# Patient Record
Sex: Male | Born: 1935 | Race: White | Hispanic: No | Marital: Married | Smoking: Former smoker
Health system: Southern US, Community
[De-identification: ages and names within clinical notes are randomized; demographics above are authoritative.]

## PROBLEM LIST (undated history)

## (undated) DIAGNOSIS — I10 Essential (primary) hypertension: Secondary | ICD-10-CM

## (undated) DIAGNOSIS — K222 Esophageal obstruction: Secondary | ICD-10-CM

## (undated) DIAGNOSIS — J61 Pneumoconiosis due to asbestos and other mineral fibers: Secondary | ICD-10-CM

## (undated) DIAGNOSIS — I359 Nonrheumatic aortic valve disorder, unspecified: Secondary | ICD-10-CM

## (undated) DIAGNOSIS — Z7901 Long term (current) use of anticoagulants: Secondary | ICD-10-CM

## (undated) DIAGNOSIS — G4733 Obstructive sleep apnea (adult) (pediatric): Secondary | ICD-10-CM

## (undated) DIAGNOSIS — E785 Hyperlipidemia, unspecified: Secondary | ICD-10-CM

## (undated) DIAGNOSIS — F0390 Unspecified dementia without behavioral disturbance: Secondary | ICD-10-CM

## (undated) DIAGNOSIS — I2510 Atherosclerotic heart disease of native coronary artery without angina pectoris: Secondary | ICD-10-CM

## (undated) DIAGNOSIS — R0602 Shortness of breath: Secondary | ICD-10-CM

## (undated) DIAGNOSIS — E860 Dehydration: Secondary | ICD-10-CM

## (undated) DIAGNOSIS — I739 Peripheral vascular disease, unspecified: Secondary | ICD-10-CM

## (undated) DIAGNOSIS — D696 Thrombocytopenia, unspecified: Secondary | ICD-10-CM

## (undated) DIAGNOSIS — K635 Polyp of colon: Secondary | ICD-10-CM

## (undated) DIAGNOSIS — D72829 Elevated white blood cell count, unspecified: Secondary | ICD-10-CM

## (undated) DIAGNOSIS — Z85038 Personal history of other malignant neoplasm of large intestine: Secondary | ICD-10-CM

## (undated) DIAGNOSIS — IMO0002 Reserved for concepts with insufficient information to code with codable children: Secondary | ICD-10-CM

## (undated) DIAGNOSIS — I679 Cerebrovascular disease, unspecified: Secondary | ICD-10-CM

## (undated) DIAGNOSIS — J189 Pneumonia, unspecified organism: Secondary | ICD-10-CM

## (undated) DIAGNOSIS — I509 Heart failure, unspecified: Secondary | ICD-10-CM

## (undated) DIAGNOSIS — J9690 Respiratory failure, unspecified, unspecified whether with hypoxia or hypercapnia: Secondary | ICD-10-CM

## (undated) DIAGNOSIS — R0902 Hypoxemia: Secondary | ICD-10-CM

## (undated) DIAGNOSIS — I4891 Unspecified atrial fibrillation: Secondary | ICD-10-CM

## (undated) HISTORY — DX: Hyperlipidemia, unspecified: E78.5

## (undated) HISTORY — DX: Nonrheumatic aortic valve disorder, unspecified: I35.9

## (undated) HISTORY — DX: Personal history of other malignant neoplasm of large intestine: Z85.038

## (undated) HISTORY — PX: KNEE SURGERY: SHX244

## (undated) HISTORY — DX: Pneumoconiosis due to asbestos and other mineral fibers: J61

## (undated) HISTORY — DX: Unspecified atrial fibrillation: I48.91

## (undated) HISTORY — DX: Obstructive sleep apnea (adult) (pediatric): G47.33

## (undated) HISTORY — DX: Long term (current) use of anticoagulants: Z79.01

## (undated) HISTORY — PX: AORTO-FEMORAL BYPASS GRAFT: SHX885

## (undated) HISTORY — DX: Atherosclerotic heart disease of native coronary artery without angina pectoris: I25.10

## (undated) HISTORY — DX: Hypoxemia: R09.02

## (undated) HISTORY — DX: Polyp of colon: K63.5

## (undated) HISTORY — DX: Peripheral vascular disease, unspecified: I73.9

## (undated) HISTORY — PX: EYE SURGERY: SHX253

## (undated) HISTORY — DX: Shortness of breath: R06.02

## (undated) HISTORY — DX: Essential (primary) hypertension: I10

## (undated) HISTORY — DX: Reserved for concepts with insufficient information to code with codable children: IMO0002

## (undated) HISTORY — DX: Cerebrovascular disease, unspecified: I67.9

---

## 1990-09-01 HISTORY — PX: ABDOMINAL AORTIC ANEURYSM REPAIR: SUR1152

## 2000-12-31 ENCOUNTER — Encounter: Admission: RE | Admit: 2000-12-31 | Discharge: 2000-12-31 | Payer: Self-pay | Admitting: Critical Care Medicine

## 2000-12-31 ENCOUNTER — Encounter: Payer: Self-pay | Admitting: Critical Care Medicine

## 2001-04-10 ENCOUNTER — Encounter: Payer: Self-pay | Admitting: Emergency Medicine

## 2001-04-10 ENCOUNTER — Emergency Department (HOSPITAL_COMMUNITY): Admission: EM | Admit: 2001-04-10 | Discharge: 2001-04-10 | Payer: Self-pay | Admitting: Emergency Medicine

## 2002-09-28 ENCOUNTER — Ambulatory Visit (HOSPITAL_COMMUNITY): Admission: RE | Admit: 2002-09-28 | Discharge: 2002-09-28 | Payer: Self-pay | Admitting: Endocrinology

## 2002-09-28 ENCOUNTER — Encounter: Payer: Self-pay | Admitting: Endocrinology

## 2002-11-11 ENCOUNTER — Encounter: Payer: Self-pay | Admitting: Orthopedic Surgery

## 2002-11-11 ENCOUNTER — Encounter: Admission: RE | Admit: 2002-11-11 | Discharge: 2002-11-11 | Payer: Self-pay | Admitting: Orthopedic Surgery

## 2003-09-02 HISTORY — PX: AORTIC VALVE REPLACEMENT: SHX41

## 2003-09-02 HISTORY — PX: CORONARY ARTERY BYPASS GRAFT: SHX141

## 2003-10-25 ENCOUNTER — Inpatient Hospital Stay (HOSPITAL_COMMUNITY): Admission: RE | Admit: 2003-10-25 | Discharge: 2003-11-08 | Payer: Self-pay | Admitting: *Deleted

## 2003-10-31 ENCOUNTER — Encounter (INDEPENDENT_AMBULATORY_CARE_PROVIDER_SITE_OTHER): Payer: Self-pay | Admitting: Specialist

## 2003-11-07 ENCOUNTER — Encounter: Payer: Self-pay | Admitting: Cardiology

## 2003-11-23 ENCOUNTER — Encounter: Admission: RE | Admit: 2003-11-23 | Discharge: 2004-02-21 | Payer: Self-pay | Admitting: Cardiology

## 2003-12-18 ENCOUNTER — Encounter (HOSPITAL_COMMUNITY): Admission: RE | Admit: 2003-12-18 | Discharge: 2004-03-17 | Payer: Self-pay | Admitting: Cardiology

## 2004-03-15 ENCOUNTER — Encounter
Admission: RE | Admit: 2004-03-15 | Discharge: 2004-03-15 | Payer: Self-pay | Admitting: Thoracic Surgery (Cardiothoracic Vascular Surgery)

## 2004-03-18 ENCOUNTER — Encounter (HOSPITAL_COMMUNITY): Admission: RE | Admit: 2004-03-18 | Discharge: 2004-06-16 | Payer: Self-pay | Admitting: Cardiology

## 2004-03-19 ENCOUNTER — Encounter
Admission: RE | Admit: 2004-03-19 | Discharge: 2004-03-19 | Payer: Self-pay | Admitting: Thoracic Surgery (Cardiothoracic Vascular Surgery)

## 2004-08-08 ENCOUNTER — Ambulatory Visit (HOSPITAL_COMMUNITY): Admission: RE | Admit: 2004-08-08 | Discharge: 2004-08-08 | Payer: Self-pay | Admitting: Family Medicine

## 2004-08-13 ENCOUNTER — Other Ambulatory Visit: Admission: RE | Admit: 2004-08-13 | Discharge: 2004-08-13 | Payer: Self-pay | Admitting: Dermatology

## 2004-09-04 ENCOUNTER — Ambulatory Visit: Payer: Self-pay | Admitting: Gastroenterology

## 2004-09-17 ENCOUNTER — Encounter (INDEPENDENT_AMBULATORY_CARE_PROVIDER_SITE_OTHER): Payer: Self-pay | Admitting: Specialist

## 2004-09-17 ENCOUNTER — Ambulatory Visit (HOSPITAL_COMMUNITY): Admission: RE | Admit: 2004-09-17 | Discharge: 2004-09-17 | Payer: Self-pay | Admitting: Gastroenterology

## 2004-09-17 ENCOUNTER — Ambulatory Visit: Payer: Self-pay | Admitting: Gastroenterology

## 2004-12-02 ENCOUNTER — Ambulatory Visit: Payer: Self-pay | Admitting: Cardiology

## 2004-12-20 ENCOUNTER — Ambulatory Visit: Payer: Self-pay

## 2004-12-20 ENCOUNTER — Ambulatory Visit: Payer: Self-pay | Admitting: Cardiology

## 2004-12-30 ENCOUNTER — Ambulatory Visit: Payer: Self-pay | Admitting: Cardiology

## 2005-02-19 ENCOUNTER — Ambulatory Visit: Payer: Self-pay | Admitting: Cardiology

## 2005-06-04 ENCOUNTER — Ambulatory Visit: Payer: Self-pay | Admitting: Cardiology

## 2005-06-13 ENCOUNTER — Ambulatory Visit: Payer: Self-pay | Admitting: Gastroenterology

## 2005-09-17 ENCOUNTER — Other Ambulatory Visit: Admission: RE | Admit: 2005-09-17 | Discharge: 2005-09-17 | Payer: Self-pay | Admitting: Dermatology

## 2005-12-26 ENCOUNTER — Ambulatory Visit: Payer: Self-pay

## 2005-12-26 ENCOUNTER — Ambulatory Visit: Payer: Self-pay | Admitting: Cardiology

## 2006-01-06 ENCOUNTER — Ambulatory Visit: Payer: Self-pay

## 2006-01-06 ENCOUNTER — Ambulatory Visit: Payer: Self-pay | Admitting: Cardiology

## 2006-01-28 ENCOUNTER — Ambulatory Visit: Payer: Self-pay | Admitting: Cardiovascular Disease

## 2006-03-02 ENCOUNTER — Ambulatory Visit: Payer: Self-pay | Admitting: Cardiology

## 2006-03-10 ENCOUNTER — Ambulatory Visit: Payer: Self-pay

## 2006-03-12 ENCOUNTER — Ambulatory Visit: Payer: Self-pay | Admitting: Cardiology

## 2006-07-14 ENCOUNTER — Ambulatory Visit: Payer: Self-pay | Admitting: Internal Medicine

## 2006-07-27 ENCOUNTER — Ambulatory Visit: Payer: Self-pay | Admitting: Cardiology

## 2006-08-12 ENCOUNTER — Ambulatory Visit: Payer: Self-pay | Admitting: Gastroenterology

## 2006-09-09 ENCOUNTER — Ambulatory Visit: Payer: Self-pay | Admitting: Cardiology

## 2006-09-10 ENCOUNTER — Encounter (INDEPENDENT_AMBULATORY_CARE_PROVIDER_SITE_OTHER): Payer: Self-pay | Admitting: Cardiology

## 2006-09-10 LAB — CONVERTED CEMR LAB: Glucose, Bld: 122 mg/dL — ABNORMAL HIGH (ref 70–99)

## 2006-09-11 ENCOUNTER — Ambulatory Visit: Payer: Self-pay | Admitting: Internal Medicine

## 2006-09-21 ENCOUNTER — Ambulatory Visit: Payer: Self-pay | Admitting: Critical Care Medicine

## 2006-12-29 ENCOUNTER — Ambulatory Visit: Payer: Self-pay

## 2007-03-26 ENCOUNTER — Ambulatory Visit: Payer: Self-pay | Admitting: Cardiology

## 2007-04-08 ENCOUNTER — Encounter: Payer: Self-pay | Admitting: Cardiology

## 2007-04-08 ENCOUNTER — Ambulatory Visit: Payer: Self-pay

## 2007-06-01 ENCOUNTER — Ambulatory Visit (HOSPITAL_COMMUNITY): Admission: RE | Admit: 2007-06-01 | Discharge: 2007-06-01 | Payer: Self-pay | Admitting: Family Medicine

## 2007-09-24 ENCOUNTER — Ambulatory Visit: Payer: Self-pay | Admitting: Cardiology

## 2007-10-07 DIAGNOSIS — I679 Cerebrovascular disease, unspecified: Secondary | ICD-10-CM

## 2007-10-07 DIAGNOSIS — I10 Essential (primary) hypertension: Secondary | ICD-10-CM | POA: Insufficient documentation

## 2007-10-07 DIAGNOSIS — I359 Nonrheumatic aortic valve disorder, unspecified: Secondary | ICD-10-CM | POA: Insufficient documentation

## 2007-10-07 DIAGNOSIS — I2510 Atherosclerotic heart disease of native coronary artery without angina pectoris: Secondary | ICD-10-CM | POA: Insufficient documentation

## 2007-10-07 DIAGNOSIS — E785 Hyperlipidemia, unspecified: Secondary | ICD-10-CM

## 2007-10-07 DIAGNOSIS — J309 Allergic rhinitis, unspecified: Secondary | ICD-10-CM | POA: Insufficient documentation

## 2007-10-07 DIAGNOSIS — I739 Peripheral vascular disease, unspecified: Secondary | ICD-10-CM

## 2007-10-07 DIAGNOSIS — G4733 Obstructive sleep apnea (adult) (pediatric): Secondary | ICD-10-CM

## 2007-10-07 DIAGNOSIS — Z951 Presence of aortocoronary bypass graft: Secondary | ICD-10-CM

## 2007-10-07 HISTORY — DX: Cerebrovascular disease, unspecified: I67.9

## 2007-10-07 HISTORY — DX: Atherosclerotic heart disease of native coronary artery without angina pectoris: I25.10

## 2007-10-07 HISTORY — DX: Nonrheumatic aortic valve disorder, unspecified: I35.9

## 2007-10-07 HISTORY — DX: Obstructive sleep apnea (adult) (pediatric): G47.33

## 2007-10-07 HISTORY — DX: Peripheral vascular disease, unspecified: I73.9

## 2007-10-07 HISTORY — DX: Hyperlipidemia, unspecified: E78.5

## 2007-10-07 HISTORY — DX: Essential (primary) hypertension: I10

## 2007-10-08 ENCOUNTER — Ambulatory Visit: Payer: Self-pay | Admitting: Critical Care Medicine

## 2007-11-23 ENCOUNTER — Ambulatory Visit: Payer: Self-pay | Admitting: Internal Medicine

## 2007-11-23 ENCOUNTER — Inpatient Hospital Stay (HOSPITAL_COMMUNITY): Admission: EM | Admit: 2007-11-23 | Discharge: 2007-11-27 | Payer: Self-pay | Admitting: Emergency Medicine

## 2007-11-24 ENCOUNTER — Encounter: Payer: Self-pay | Admitting: Cardiology

## 2007-12-01 ENCOUNTER — Ambulatory Visit: Payer: Self-pay | Admitting: Internal Medicine

## 2007-12-01 HISTORY — PX: ESOPHAGOGASTRODUODENOSCOPY: SHX1529

## 2007-12-08 ENCOUNTER — Ambulatory Visit: Payer: Self-pay | Admitting: Cardiovascular Disease

## 2007-12-11 ENCOUNTER — Ambulatory Visit: Payer: Self-pay | Admitting: Gastroenterology

## 2007-12-11 ENCOUNTER — Emergency Department (HOSPITAL_COMMUNITY): Admission: EM | Admit: 2007-12-11 | Discharge: 2007-12-11 | Payer: Self-pay | Admitting: Emergency Medicine

## 2007-12-13 ENCOUNTER — Ambulatory Visit: Payer: Self-pay | Admitting: Cardiology

## 2007-12-27 ENCOUNTER — Ambulatory Visit: Payer: Self-pay | Admitting: Cardiovascular Disease

## 2008-01-10 ENCOUNTER — Ambulatory Visit: Payer: Self-pay | Admitting: Cardiology

## 2008-02-01 ENCOUNTER — Ambulatory Visit: Payer: Self-pay | Admitting: Cardiology

## 2008-02-06 ENCOUNTER — Emergency Department (HOSPITAL_COMMUNITY): Admission: EM | Admit: 2008-02-06 | Discharge: 2008-02-06 | Payer: Self-pay | Admitting: Emergency Medicine

## 2008-02-07 ENCOUNTER — Encounter: Payer: Self-pay | Admitting: Gastroenterology

## 2008-02-09 ENCOUNTER — Ambulatory Visit: Payer: Self-pay | Admitting: Gastroenterology

## 2008-02-09 ENCOUNTER — Ambulatory Visit: Payer: Self-pay

## 2008-02-14 ENCOUNTER — Ambulatory Visit: Payer: Self-pay | Admitting: Critical Care Medicine

## 2008-02-14 ENCOUNTER — Ambulatory Visit: Payer: Self-pay | Admitting: Cardiology

## 2008-02-14 DIAGNOSIS — R0602 Shortness of breath: Secondary | ICD-10-CM

## 2008-02-14 HISTORY — DX: Shortness of breath: R06.02

## 2008-02-14 LAB — CONVERTED CEMR LAB: Prothrombin Time: 18.2 s — ABNORMAL HIGH (ref 10.9–13.3)

## 2008-02-15 ENCOUNTER — Ambulatory Visit: Payer: Self-pay | Admitting: Critical Care Medicine

## 2008-02-21 ENCOUNTER — Encounter: Payer: Self-pay | Admitting: Critical Care Medicine

## 2008-02-21 DIAGNOSIS — J61 Pneumoconiosis due to asbestos and other mineral fibers: Secondary | ICD-10-CM

## 2008-02-21 HISTORY — DX: Pneumoconiosis due to asbestos and other mineral fibers: J61

## 2008-02-29 ENCOUNTER — Ambulatory Visit: Payer: Self-pay | Admitting: Critical Care Medicine

## 2008-03-09 ENCOUNTER — Ambulatory Visit: Payer: Self-pay | Admitting: Cardiology

## 2008-04-06 ENCOUNTER — Ambulatory Visit: Payer: Self-pay | Admitting: Internal Medicine

## 2008-05-04 ENCOUNTER — Ambulatory Visit: Payer: Self-pay | Admitting: Internal Medicine

## 2008-05-15 ENCOUNTER — Ambulatory Visit: Payer: Self-pay | Admitting: Critical Care Medicine

## 2008-05-18 ENCOUNTER — Ambulatory Visit: Payer: Self-pay | Admitting: Cardiology

## 2008-06-08 ENCOUNTER — Ambulatory Visit: Payer: Self-pay | Admitting: Cardiology

## 2008-06-15 ENCOUNTER — Telehealth (INDEPENDENT_AMBULATORY_CARE_PROVIDER_SITE_OTHER): Payer: Self-pay | Admitting: *Deleted

## 2008-07-06 ENCOUNTER — Ambulatory Visit: Payer: Self-pay | Admitting: Cardiology

## 2008-08-02 ENCOUNTER — Ambulatory Visit: Payer: Self-pay | Admitting: Cardiology

## 2008-08-03 ENCOUNTER — Ambulatory Visit: Payer: Self-pay | Admitting: Cardiology

## 2008-08-21 ENCOUNTER — Ambulatory Visit: Payer: Self-pay | Admitting: Cardiology

## 2008-09-05 ENCOUNTER — Ambulatory Visit (HOSPITAL_COMMUNITY): Admission: RE | Admit: 2008-09-05 | Discharge: 2008-09-05 | Payer: Self-pay | Admitting: Cardiology

## 2008-09-13 ENCOUNTER — Ambulatory Visit (HOSPITAL_COMMUNITY): Admission: RE | Admit: 2008-09-13 | Discharge: 2008-09-13 | Payer: Self-pay | Admitting: Family Medicine

## 2008-09-15 ENCOUNTER — Ambulatory Visit: Payer: Self-pay | Admitting: Cardiology

## 2008-09-15 ENCOUNTER — Encounter: Payer: Self-pay | Admitting: Physician Assistant

## 2008-09-18 ENCOUNTER — Ambulatory Visit: Payer: Self-pay | Admitting: Cardiology

## 2008-09-22 ENCOUNTER — Ambulatory Visit: Payer: Self-pay | Admitting: Internal Medicine

## 2008-09-27 ENCOUNTER — Ambulatory Visit: Payer: Self-pay | Admitting: Cardiology

## 2008-10-05 ENCOUNTER — Ambulatory Visit: Payer: Self-pay | Admitting: Internal Medicine

## 2008-10-23 ENCOUNTER — Ambulatory Visit: Payer: Self-pay | Admitting: Cardiology

## 2008-10-25 ENCOUNTER — Ambulatory Visit: Payer: Self-pay | Admitting: Cardiology

## 2008-11-20 ENCOUNTER — Ambulatory Visit: Payer: Self-pay | Admitting: Cardiology

## 2008-12-18 ENCOUNTER — Ambulatory Visit: Payer: Self-pay | Admitting: Cardiovascular Disease

## 2009-01-03 ENCOUNTER — Ambulatory Visit: Payer: Self-pay | Admitting: Cardiovascular Disease

## 2009-01-19 ENCOUNTER — Ambulatory Visit: Payer: Self-pay | Admitting: Internal Medicine

## 2009-01-19 ENCOUNTER — Ambulatory Visit: Payer: Self-pay | Admitting: Cardiology

## 2009-01-19 DIAGNOSIS — I4891 Unspecified atrial fibrillation: Secondary | ICD-10-CM

## 2009-01-19 HISTORY — DX: Unspecified atrial fibrillation: I48.91

## 2009-01-31 ENCOUNTER — Encounter: Payer: Self-pay | Admitting: *Deleted

## 2009-02-16 ENCOUNTER — Ambulatory Visit: Payer: Self-pay | Admitting: Internal Medicine

## 2009-02-16 LAB — CONVERTED CEMR LAB: POC INR: 1.8

## 2009-03-07 ENCOUNTER — Encounter: Payer: Self-pay | Admitting: *Deleted

## 2009-03-16 ENCOUNTER — Ambulatory Visit: Payer: Self-pay | Admitting: Cardiology

## 2009-04-06 ENCOUNTER — Ambulatory Visit: Payer: Self-pay | Admitting: Cardiology

## 2009-05-04 ENCOUNTER — Ambulatory Visit: Payer: Self-pay | Admitting: Internal Medicine

## 2009-05-11 ENCOUNTER — Ambulatory Visit (HOSPITAL_COMMUNITY): Admission: RE | Admit: 2009-05-11 | Discharge: 2009-05-11 | Payer: Self-pay | Admitting: Family Medicine

## 2009-05-11 ENCOUNTER — Encounter: Payer: Self-pay | Admitting: Cardiology

## 2009-05-15 ENCOUNTER — Ambulatory Visit: Payer: Self-pay | Admitting: Internal Medicine

## 2009-05-15 DIAGNOSIS — R0902 Hypoxemia: Secondary | ICD-10-CM

## 2009-05-15 HISTORY — DX: Hypoxemia: R09.02

## 2009-05-16 ENCOUNTER — Encounter: Payer: Self-pay | Admitting: Adult Health

## 2009-05-17 ENCOUNTER — Encounter: Payer: Self-pay | Admitting: Adult Health

## 2009-05-18 ENCOUNTER — Telehealth (INDEPENDENT_AMBULATORY_CARE_PROVIDER_SITE_OTHER): Payer: Self-pay | Admitting: *Deleted

## 2009-05-22 ENCOUNTER — Telehealth: Payer: Self-pay | Admitting: Cardiology

## 2009-05-23 ENCOUNTER — Ambulatory Visit: Payer: Self-pay | Admitting: Cardiology

## 2009-05-24 ENCOUNTER — Encounter (INDEPENDENT_AMBULATORY_CARE_PROVIDER_SITE_OTHER): Payer: Self-pay | Admitting: *Deleted

## 2009-06-01 ENCOUNTER — Ambulatory Visit: Payer: Self-pay | Admitting: Internal Medicine

## 2009-06-01 LAB — CONVERTED CEMR LAB: POC INR: 2.6

## 2009-06-05 ENCOUNTER — Telehealth (INDEPENDENT_AMBULATORY_CARE_PROVIDER_SITE_OTHER): Payer: Self-pay | Admitting: *Deleted

## 2009-06-12 ENCOUNTER — Encounter (INDEPENDENT_AMBULATORY_CARE_PROVIDER_SITE_OTHER): Payer: Self-pay | Admitting: *Deleted

## 2009-06-20 ENCOUNTER — Telehealth: Payer: Self-pay | Admitting: Critical Care Medicine

## 2009-06-20 ENCOUNTER — Telehealth: Payer: Self-pay | Admitting: Cardiology

## 2009-06-29 ENCOUNTER — Ambulatory Visit: Payer: Self-pay | Admitting: Internal Medicine

## 2009-06-29 LAB — CONVERTED CEMR LAB: POC INR: 2.4

## 2009-07-23 ENCOUNTER — Encounter: Payer: Self-pay | Admitting: Cardiology

## 2009-07-27 ENCOUNTER — Ambulatory Visit: Payer: Self-pay | Admitting: Internal Medicine

## 2009-08-21 ENCOUNTER — Ambulatory Visit: Payer: Self-pay | Admitting: Cardiology

## 2009-08-21 ENCOUNTER — Ambulatory Visit: Payer: Self-pay | Admitting: Internal Medicine

## 2009-08-21 LAB — CONVERTED CEMR LAB: POC INR: 2.4

## 2009-09-18 ENCOUNTER — Ambulatory Visit: Payer: Self-pay | Admitting: Cardiology

## 2009-09-18 LAB — CONVERTED CEMR LAB: POC INR: 2.2

## 2009-10-16 ENCOUNTER — Ambulatory Visit: Payer: Self-pay | Admitting: Internal Medicine

## 2009-10-22 ENCOUNTER — Encounter: Payer: Self-pay | Admitting: Cardiology

## 2009-11-13 ENCOUNTER — Ambulatory Visit: Payer: Self-pay | Admitting: Cardiology

## 2009-12-11 ENCOUNTER — Ambulatory Visit: Payer: Self-pay | Admitting: Cardiovascular Disease

## 2010-01-01 ENCOUNTER — Ambulatory Visit: Payer: Self-pay | Admitting: Cardiology

## 2010-01-01 LAB — CONVERTED CEMR LAB: POC INR: 2.7

## 2010-02-01 ENCOUNTER — Ambulatory Visit: Payer: Self-pay | Admitting: Cardiology

## 2010-02-01 ENCOUNTER — Ambulatory Visit: Payer: Self-pay | Admitting: Cardiovascular Disease

## 2010-02-01 DIAGNOSIS — R609 Edema, unspecified: Secondary | ICD-10-CM

## 2010-02-12 ENCOUNTER — Ambulatory Visit: Payer: Self-pay | Admitting: Cardiology

## 2010-02-14 LAB — CONVERTED CEMR LAB
BUN: 19 mg/dL (ref 6–23)
Chloride: 105 meq/L (ref 96–112)
Glucose, Bld: 107 mg/dL — ABNORMAL HIGH (ref 70–99)
Potassium: 4.1 meq/L (ref 3.5–5.1)

## 2010-03-01 ENCOUNTER — Ambulatory Visit: Payer: Self-pay | Admitting: Cardiology

## 2010-03-01 LAB — CONVERTED CEMR LAB: POC INR: 2.1

## 2010-03-29 ENCOUNTER — Ambulatory Visit: Payer: Self-pay | Admitting: Cardiology

## 2010-04-26 ENCOUNTER — Ambulatory Visit: Payer: Self-pay | Admitting: Internal Medicine

## 2010-04-26 ENCOUNTER — Encounter (INDEPENDENT_AMBULATORY_CARE_PROVIDER_SITE_OTHER): Payer: Self-pay | Admitting: *Deleted

## 2010-04-30 ENCOUNTER — Ambulatory Visit: Payer: Self-pay | Admitting: Critical Care Medicine

## 2010-04-30 DIAGNOSIS — J449 Chronic obstructive pulmonary disease, unspecified: Secondary | ICD-10-CM

## 2010-04-30 DIAGNOSIS — J4489 Other specified chronic obstructive pulmonary disease: Secondary | ICD-10-CM | POA: Insufficient documentation

## 2010-05-08 ENCOUNTER — Ambulatory Visit: Payer: Self-pay | Admitting: Cardiology

## 2010-05-24 ENCOUNTER — Ambulatory Visit: Payer: Self-pay | Admitting: Internal Medicine

## 2010-05-24 LAB — CONVERTED CEMR LAB: POC INR: 2.9

## 2010-05-26 ENCOUNTER — Encounter: Admission: RE | Admit: 2010-05-26 | Discharge: 2010-05-26 | Payer: Self-pay | Admitting: Sports Medicine

## 2010-05-29 ENCOUNTER — Encounter: Payer: Self-pay | Admitting: Cardiology

## 2010-06-05 ENCOUNTER — Ambulatory Visit: Payer: Self-pay | Admitting: Cardiovascular Disease

## 2010-06-05 ENCOUNTER — Encounter: Admission: RE | Admit: 2010-06-05 | Discharge: 2010-06-05 | Payer: Self-pay | Admitting: Sports Medicine

## 2010-06-05 LAB — CONVERTED CEMR LAB: POC INR: 1.3

## 2010-06-10 ENCOUNTER — Encounter: Payer: Self-pay | Admitting: Critical Care Medicine

## 2010-06-14 ENCOUNTER — Ambulatory Visit: Payer: Self-pay | Admitting: Cardiology

## 2010-06-14 LAB — CONVERTED CEMR LAB: POC INR: 3

## 2010-06-18 ENCOUNTER — Ambulatory Visit: Payer: Self-pay | Admitting: Critical Care Medicine

## 2010-07-12 ENCOUNTER — Ambulatory Visit: Payer: Self-pay | Admitting: Cardiology

## 2010-07-29 ENCOUNTER — Ambulatory Visit: Payer: Self-pay | Admitting: Internal Medicine

## 2010-07-29 LAB — CONVERTED CEMR LAB: POC INR: 2.8

## 2010-08-07 ENCOUNTER — Encounter
Admission: RE | Admit: 2010-08-07 | Discharge: 2010-08-07 | Payer: Self-pay | Source: Home / Self Care | Admitting: Sports Medicine

## 2010-08-07 ENCOUNTER — Ambulatory Visit: Payer: Self-pay | Admitting: Internal Medicine

## 2010-08-19 ENCOUNTER — Ambulatory Visit: Payer: Self-pay | Admitting: Internal Medicine

## 2010-08-19 LAB — CONVERTED CEMR LAB: POC INR: 2.5

## 2010-09-16 ENCOUNTER — Ambulatory Visit: Admission: RE | Admit: 2010-09-16 | Discharge: 2010-09-16 | Payer: Self-pay | Source: Home / Self Care

## 2010-09-16 LAB — CONVERTED CEMR LAB: POC INR: 2.5

## 2010-09-22 ENCOUNTER — Encounter: Payer: Self-pay | Admitting: Thoracic Surgery (Cardiothoracic Vascular Surgery)

## 2010-09-26 ENCOUNTER — Telehealth: Payer: Self-pay | Admitting: Cardiology

## 2010-10-03 ENCOUNTER — Ambulatory Visit (INDEPENDENT_AMBULATORY_CARE_PROVIDER_SITE_OTHER): Payer: Medicare Other | Admitting: Critical Care Medicine

## 2010-10-03 ENCOUNTER — Encounter: Payer: Self-pay | Admitting: Critical Care Medicine

## 2010-10-03 ENCOUNTER — Ambulatory Visit: Admit: 2010-10-03 | Payer: Self-pay | Admitting: Critical Care Medicine

## 2010-10-03 DIAGNOSIS — J449 Chronic obstructive pulmonary disease, unspecified: Secondary | ICD-10-CM

## 2010-10-03 DIAGNOSIS — J4489 Other specified chronic obstructive pulmonary disease: Secondary | ICD-10-CM

## 2010-10-03 NOTE — Medication Information (Signed)
Summary: rov/sp  Anticoagulant Therapy  Managed by: Bethena Midget, RN, BSN Referring MD: Valera Castle MD PCP: Dr. Bernell List Supervising MD: Tenny Craw MD, Gunnar Fusi Indication 1: Atrial Fibrillation (ICD-427.31) Lab Used: [facility] Site: [facility] INR POC 2.7 INR RANGE 2 - 3  Dietary changes: no    Health status changes: no    Bleeding/hemorrhagic complications: no    Recent/future hospitalizations: no    Any changes in medication regimen? no    Recent/future dental: no  Any missed doses?: no       Is patient compliant with meds? yes       Allergies: 1)  ! Morphine  Anticoagulation Management History:      The patient is taking warfarin and comes in today for a routine follow up visit.  Positive risk factors for bleeding include an age of 75 years or older.  The bleeding index is 'intermediate risk'.  Positive CHADS2 values include History of HTN and Age > 75 years old.  The start date was 11/24/2007.  His last INR was 1.6 RATIO.  Anticoagulation responsible provider: Tenny Craw MD, Gunnar Fusi.  INR POC: 2.7.  Cuvette Lot#: [id number].  Exp: 06/2011.    Anticoagulation Management Assessment/Plan:      The patient's current anticoagulation dose is Warfarin sodium 5 mg  tabs: Take as directed by coumadin clinic..  The target INR is 2 - 3.  The next INR is due 05/24/2010.  Anticoagulation instructions were given to patient.  Results were reviewed/authorized by Bethena Midget, RN, BSN.  He was notified by Bethena Midget, RN, BSN.         Prior Anticoagulation Instructions: INR 2.3  Continue same dose of 1 tablet every day except 1/2 tablet on Monday and Friday.    Current Anticoagulation Instructions: INR 2.7 Continue 5mg s everyday except 2.5mg s on Mondays and Fridays. Recheck in 4 weeks.

## 2010-10-03 NOTE — Medication Information (Signed)
Summary: ccr/per pt call/jss  Anticoagulant Therapy  Managed by: Leota Sauers, PharmD, BCPS, CPP Referring MD: Valera Castle MD PCP: Dr. Bernell List Supervising MD: Daleen Squibb MD, Maisie Fus Indication 1: Atrial Fibrillation (ICD-427.31) Lab Used: [facility] Site: [facility] INR POC 2.7 INR RANGE 2 - 3  Dietary changes: no    Health status changes: no    Bleeding/hemorrhagic complications: no    Recent/future hospitalizations: no    Any changes in medication regimen? no    Recent/future dental: no  Any missed doses?: no       Is patient compliant with meds? yes       Current Medications (verified): 1)  Metoprolol Succinate 50 Mg Xr24h-Tab (Metoprolol Succinate) .Marland Kitchen.. 1 Tab Once Daily 2)  Lisinopril 40 Mg  Tabs (Lisinopril) .Marland Kitchen.. 1 By Mouth Daily 3)  Pravastatin Sodium 40 Mg  Tabs (Pravastatin Sodium) .Marland Kitchen.. 1 By Mouth Daily 4)  Adult Aspirin Ec Low Strength 81 Mg  Tbec (Aspirin) .Marland Kitchen.. 1 By Mouth Daily 5)  Multivitamins   Tabs (Multiple Vitamin) .Marland Kitchen.. 1 By Mouth Daily 6)  Amlodipine Besylate 10 Mg  Tabs (Amlodipine Besylate) .Marland Kitchen.. 1 By Mouth Daily 7)  Cpap .Marland Kitchen.. 12 Cmh20 At Bedtime 8)  Klor-Con M20 20 Meq  Tbcr (Potassium Chloride Crys Cr) .... As Needed 9)  Furosemide 40 Mg  Tabs (Furosemide) .... As Needed 10)  Warfarin Sodium 5 Mg  Tabs (Warfarin Sodium) .... Take As Directed By Coumadin Clinic. 11)  Nexium 40 Mg Cpdr (Esomeprazole Magnesium) .Marland Kitchen.. 1 Cap Once Daily  Allergies: 1)  ! Morphine  Anticoagulation Management History:      The patient is taking warfarin and comes in today for a routine follow up visit.  Positive risk factors for bleeding include an age of 75 years or older.  The bleeding index is 'intermediate risk'.  Positive CHADS2 values include History of HTN and Age > 31 years old.  The start date was 11/24/2007.  His last INR was 1.6 RATIO.  Anticoagulation responsible provider: Daleen Squibb MD, Maisie Fus.  INR POC: 2.7.  Cuvette Lot#: [id number].  Exp: 12/2010.    Anticoagulation  Management Assessment/Plan:      The patient's current anticoagulation dose is Warfarin sodium 5 mg  tabs: Take as directed by coumadin clinic..  The target INR is 2 - 3.  The next INR is due 01/29/2010.  Anticoagulation instructions were given to patient.  Results were reviewed/authorized by Leota Sauers, PharmD, BCPS, CPP.         Prior Anticoagulation Instructions: INR 3.1  Take 1/2 tablet tomorrow then resume same dose of 1 tablet every day except 1/2 tablet on Monday and Friday   Current Anticoagulation Instructions: INR 2.7  Coumadin 1 tab = 5mg  each day EXCEPT 1/2 tab on Mon and Fri

## 2010-10-03 NOTE — Letter (Signed)
[facility]: [facility]   Imported By: Marylou Mccoy 06/10/2010 08:28:57  _____________________________________________________________________  External Attachment:    Type:   Image     Comment:   External Document

## 2010-10-03 NOTE — Medication Information (Signed)
Summary: rov/tm   Anticoagulant Therapy  Managed by: Lyna Poser, PharmD Referring MD: Valera Castle MD PCP: [facility]  Supervising MD: Riley Kill MD, Maisie Fus Indication 1: Atrial Fibrillation (ICD-427.31) Lab Used[facility]  Site: [facility] INR POC 2.8 INR RANGE 2 - 3  Dietary changes: no    Health status changes: no    Bleeding/hemorrhagic complications: no    Recent/future hospitalizations: no    Any changes in medication regimen? no    Recent/future dental: no  Any missed doses?: no       Is patient compliant with meds? yes       Allergies: 1)  ! Morphine  Anticoagulation Management History:      The patient is taking warfarin and comes in today for a routine follow up visit.  Positive risk factors for bleeding include an age of 75 years or older.  The bleeding index is 'intermediate risk'.  Positive CHADS2 values include History of HTN and Age > 21 years old.  The start date was 11/24/2007.  His last INR was 1.6 RATIO.  Anticoagulation responsible Mickaela Starlin: Riley Kill MD, Maisie Fus.  INR POC: 2.8.  Cuvette Lot#: [id number].  Exp: 08/2011.    Anticoagulation Management Assessment/Plan:      The patient's current anticoagulation dose is Warfarin sodium 5 mg  tabs: Take as directed by coumadin clinic..  The target INR is 2 - 3.  The next INR is due 08/19/2010.  Anticoagulation instructions were given to patient.  Results were reviewed/authorized by Lyna Poser, PharmD.         Prior Anticoagulation Instructions: INR 3.8 Skip Saturdays dose then resume 1 pill everyday except 1/2 pill on Mondays and Fridays. Recheck in 2 1/2  weeks.   Current Anticoagulation Instructions: INR 2.8 Continue taking a half tablet on monday and friday. And 1 tablet all other days. Recheck in 3 weeks.

## 2010-10-03 NOTE — Miscellaneous (Signed)
Summary: med update  Clinical Lists Changes  Medications: Removed medication of FUROSEMIDE 40 MG  TABS (FUROSEMIDE) 1 tab once daily as needed

## 2010-10-03 NOTE — Medication Information (Signed)
Summary: rov/mw   Anticoagulant Therapy  Managed by: Weston Brass, PharmD Referring MD: Valera Castle MD PCP: [facility]  Supervising MD: Ladona Ridgel MD, Sharlot Gowda Indication 1: Atrial Fibrillation (ICD-427.31) Lab Used: [facility] Site: [facility] INR POC 1.2 INR RANGE 2 - 3  Dietary changes: no    Health status changes: yes       Details: having spinal injection today  Bleeding/hemorrhagic complications: no    Recent/future hospitalizations: no    Any changes in medication regimen? no    Recent/future dental: no  Any missed doses?: yes     Details: has been holding Coumadin since Saturday for spinal injection   Is patient compliant with meds? yes       Allergies: 1)  ! Morphine  Anticoagulation Management History:      The patient is taking warfarin and comes in today for a routine follow up visit.  Positive risk factors for bleeding include an age of 17 years or older.  The bleeding index is 'intermediate risk'.  Positive CHADS2 values include History of HTN and Age > 67 years old.  The start date was 11/24/2007.  His last INR was 1.6 RATIO.  Anticoagulation responsible Amorita Vanrossum: Ladona Ridgel MD, Sharlot Gowda.  INR POC: 1.2.  Cuvette Lot#: [id number].  Exp: 10/2011.    Anticoagulation Management Assessment/Plan:      The patient's current anticoagulation dose is Warfarin sodium 5 mg  tabs: Take as directed by coumadin clinic..  The target INR is 2 - 3.  The next INR is due 08/19/2010.  Anticoagulation instructions were given to patient.  Results were reviewed/authorized by Weston Brass, PharmD.  He was notified by Weston Brass PharmD.         Prior Anticoagulation Instructions: INR 2.8 Continue taking a half tablet on monday and friday. And 1 tablet all other days. Recheck in 3 weeks.   Current Anticoagulation Instructions: INR 1.2  Take 1 1/2 tablets today and tomorrow then resume same dose of 1 tablet every day excpet 1/2 tablet on Monday and Friday.  Recheck INR in 10-14 days.

## 2010-10-03 NOTE — Medication Information (Signed)
Summary: rov/tm   Anticoagulant Therapy  Managed by: Bethena Midget, RN, BSN Referring MD: Valera Castle MD PCP: [facility]  Supervising MD: Tenny Craw MD, Gunnar Fusi Indication 1: Atrial Fibrillation (ICD-427.31) Lab Used: [facility] Site: [facility] INR POC 2.9 INR RANGE 2 - 3  Dietary changes: no    Health status changes: no    Bleeding/hemorrhagic complications: no    Recent/future hospitalizations: no    Any changes in medication regimen? no    Recent/future dental: no  Any missed doses?: no       Is patient compliant with meds? yes       Allergies: 1)  ! Morphine  Anticoagulation Management History:      The patient is taking warfarin and comes in today for a routine follow up visit.  Positive risk factors for bleeding include an age of 75 years or older.  The bleeding index is 'intermediate risk'.  Positive CHADS2 values include History of HTN and Age > 63 years old.  The start date was 11/24/2007.  His last INR was 1.6 RATIO.  Anticoagulation responsible provider: Tenny Craw MD, Gunnar Fusi.  INR POC: 2.9.  Cuvette Lot#: [id number].  Exp: 06/2011.    Anticoagulation Management Assessment/Plan:      The patient's current anticoagulation dose is Warfarin sodium 5 mg  tabs: Take as directed by coumadin clinic..  The target INR is 2 - 3.  The next INR is due 06/21/2010.  Anticoagulation instructions were given to patient.  Results were reviewed/authorized by Bethena Midget, RN, BSN.         Prior Anticoagulation Instructions: INR 2.7 Continue 5mg s everyday except 2.5mg s on Mondays and Fridays. Recheck in 4 weeks.   Current Anticoagulation Instructions: INR 2.9  No changes today. Take 1 tablet everyday except on monday and friday. On monday and friday take a half tablet. Recheck in 4 weeks.

## 2010-10-03 NOTE — Medication Information (Signed)
Summary: rov/tm  Anticoagulant Therapy  Managed by: Eda Keys, PharmD Referring MD: Valera Castle MD PCP: Dr. Bernell List Supervising MD: Graciela Husbands MD, Viviann Spare Indication 1: Atrial Fibrillation (ICD-427.31) Lab Used: [facility] Site: [facility] INR POC 2.1 INR RANGE 2 - 3  Dietary changes: no    Health status changes: no    Bleeding/hemorrhagic complications: no    Recent/future hospitalizations: no    Any changes in medication regimen? no    Recent/future dental: no  Any missed doses?: no       Is patient compliant with meds? yes       Allergies: 1)  ! Morphine  Anticoagulation Management History:      The patient is taking warfarin and comes in today for a routine follow up visit.  Positive risk factors for bleeding include an age of 75 years or older.  The bleeding index is 'intermediate risk'.  Positive CHADS2 values include History of HTN and Age > 79 years old.  The start date was 11/24/2007.  His last INR was 1.6 RATIO.  Anticoagulation responsible provider: Graciela Husbands MD, Viviann Spare.  INR POC: 2.1.  Cuvette Lot#: [id number].  Exp: 12/2010.    Anticoagulation Management Assessment/Plan:      The patient's current anticoagulation dose is Warfarin sodium 5 mg  tabs: Take as directed by coumadin clinic..  The target INR is 2 - 3.  The next INR is due 11/13/2009.  Anticoagulation instructions were given to patient.  Results were reviewed/authorized by Eda Keys, PharmD.  He was notified by Eda Keys.         Prior Anticoagulation Instructions: INR 2.2 Continue 5mg s daily except 2.5mg s on Mondays and Fridays. Recheck in 4 weeks.   Current Anticoagulation Instructions: INR 2.1  Continue current dosing schedule.  Take 1/2 tablet (2.5 mg) on Monday and Friday, and take 1 tablet (5 mg) all other days. Return to clinic in 4 weeks.

## 2010-10-03 NOTE — Medication Information (Signed)
Summary: rov/sp  Anticoagulant Therapy  Managed by: Cloyde Reams, RN, BSN Referring MD: Valera Castle MD PCP: [facility]  Supervising MD: Daleen Squibb MD, Maisie Fus Indication 1: Atrial Fibrillation (ICD-427.31) Lab Used: [facility] Site: [facility] INR POC 2.5 INR RANGE 2 - 3  Dietary changes: no    Health status changes: no    Bleeding/hemorrhagic complications: no    Recent/future hospitalizations: no    Any changes in medication regimen? no    Recent/future dental: no  Any missed doses?: no       Is patient compliant with meds? yes       Allergies: 1)  ! Morphine  Anticoagulation Management History:      The patient is taking warfarin and comes in today for a routine follow up visit.  Positive risk factors for bleeding include an age of 75 years or older.  The bleeding index is 'intermediate risk'.  Positive CHADS2 values include History of HTN and Age > 75 years old.  The start date was 11/24/2007.  His last INR was 1.6 RATIO.  Anticoagulation responsible provider: Daleen Squibb MD, Maisie Fus.  INR POC: 2.5.  Cuvette Lot#: [id number].  Exp: 10/2011.    Anticoagulation Management Assessment/Plan:      The patient's current anticoagulation dose is Warfarin sodium 5 mg  tabs: Take as directed by coumadin clinic..  The target INR is 2 - 3.  The next INR is due 10/14/2010.  Anticoagulation instructions were given to patient.  Results were reviewed/authorized by Cloyde Reams, RN, BSN.  He was notified by Cloyde Reams RN.         Prior Anticoagulation Instructions: INR 2.5  Continue same dose of 1 tablet every day except 1/2 tablet on Monday and Friday.  Recheck INR in 4 weeks.   Current Anticoagulation Instructions: INR 2.5  Continue on same dosage 1 tablet daily except 1/2 tablet on Mondays and Fridays.  Recheck in 4 weeks.

## 2010-10-03 NOTE — Medication Information (Signed)
Summary: rov/eac  Anticoagulant Therapy  Managed by: Cloyde Reams, RN, BSN Referring MD: Valera Castle MD PCP: Dr. Judi Saa MD: Myrtis Ser MD, Tinnie Gens Indication 1: Atrial Fibrillation (ICD-427.31) Lab Used: [facility] Site: [facility] INR POC 3.0 INR RANGE 2 - 3  Dietary changes: yes       Details: Maybe a little less vit K intake.    Health status changes: no    Bleeding/hemorrhagic complications: no    Recent/future hospitalizations: no    Any changes in medication regimen? no    Recent/future dental: no  Any missed doses?: no       Is patient compliant with meds? yes       Allergies (verified): 1)  ! Morphine  Anticoagulation Management History:      The patient is taking warfarin and comes in today for a routine follow up visit.  Positive risk factors for bleeding include an age of 17 years or older.  The bleeding index is 'intermediate risk'.  Positive CHADS2 values include History of HTN and Age > 64 years old.  The start date was 11/24/2007.  His last INR was 1.6 RATIO.  Anticoagulation responsible provider: Myrtis Ser MD, Tinnie Gens.  INR POC: 3.0.  Cuvette Lot#: [id number].  Exp: 12/2010.    Anticoagulation Management Assessment/Plan:      The patient's current anticoagulation dose is Warfarin sodium 5 mg  tabs: Take as directed by coumadin clinic..  The target INR is 2 - 3.  The next INR is due 12/11/2009.  Anticoagulation instructions were given to patient.  Results were reviewed/authorized by Cloyde Reams, RN, BSN.  He was notified by Cloyde Reams RN.         Prior Anticoagulation Instructions: INR 2.1  Continue current dosing schedule.  Take 1/2 tablet (2.5 mg) on Monday and Friday, and take 1 tablet (5 mg) all other days. Return to clinic in 4 weeks.   Current Anticoagulation Instructions: INR 3.0  Continue on same dosage 1 tablet daily except 1/2 tablet on Mondays and Fridays.  Recheck in 4 weeks.    Appended Document:  rov/eac    Prescriptions: WARFARIN SODIUM 5 MG  TABS (WARFARIN SODIUM) Take as directed by coumadin clinic.  #120 x 1   Entered by:   Cloyde Reams RN   Authorized by:   Gaylord Shih, MD, [facility]   Signed by:   Cloyde Reams RN on 11/13/2009   Method used:   Electronically to        [facility] (retail)       [address] [place], [place]  [address]       Ph: [phone]       Fax: [phone]   RxID:   [id number]

## 2010-10-03 NOTE — Medication Information (Signed)
Summary: rov/sl  Anticoagulant Therapy  Managed by: Bethena Midget, RN, BSN Referring MD: Valera Castle MD PCP: [facility]  Supervising MD: Jens Som MD, Arlys John Indication 1: Atrial Fibrillation (ICD-427.31) Lab Used: [facility] Site: [facility] INR POC 3.8 INR RANGE 2 - 3  Dietary changes: no    Health status changes: no    Bleeding/hemorrhagic complications: no    Recent/future hospitalizations: no    Any changes in medication regimen? no    Recent/future dental: no  Any missed doses?: no       Is patient compliant with meds? yes       Allergies: 1)  ! Morphine  Anticoagulation Management History:      The patient is taking warfarin and comes in today for a routine follow up visit.  Positive risk factors for bleeding include an age of 75 years or older.  The bleeding index is 'intermediate risk'.  Positive CHADS2 values include History of HTN and Age > 33 years old.  The start date was 11/24/2007.  His last INR was 1.6 RATIO.  Anticoagulation responsible provider: Jens Som MD, Arlys John.  INR POC: 3.8.  Cuvette Lot#: [id number].  Exp: 08/2011.    Anticoagulation Management Assessment/Plan:      The patient's current anticoagulation dose is Warfarin sodium 5 mg  tabs: Take as directed by coumadin clinic..  The target INR is 2 - 3.  The next INR is due 07/29/2010.  Anticoagulation instructions were given to patient.  Results were reviewed/authorized by Bethena Midget, RN, BSN.  He was notified by Bethena Midget, RN, BSN.         Prior Anticoagulation Instructions: INR 3.0  Continue taking Coumadin 5 mg (1 tab) on all days except Coumadin 2.5 mg (0.5 tab) on Mondays and Fridays. Return to clinic in 4 weeks.   Current Anticoagulation Instructions: INR 3.8 Skip Saturdays dose then resume 1 pill everyday except 1/2 pill on Mondays and Fridays. Recheck in 2 1/2  weeks.

## 2010-10-03 NOTE — Medication Information (Signed)
Summary: rov/tm  Anticoagulant Therapy  Managed by: Bethena Midget, RN, BSN Referring MD: Valera Castle MD PCP: Dr. Bernell List Supervising MD: Jens Som MD, Arlys John Indication 1: Atrial Fibrillation (ICD-427.31) Lab Used: [facility] Site: [facility] INR POC 2.2 INR RANGE 2 - 3  Dietary changes: no    Health status changes: no    Bleeding/hemorrhagic complications: no    Recent/future hospitalizations: no    Any changes in medication regimen? no    Recent/future dental: no  Any missed doses?: no       Is patient compliant with meds? yes       Allergies: 1)  ! Morphine  Anticoagulation Management History:      The patient is taking warfarin and comes in today for a routine follow up visit.  Positive risk factors for bleeding include an age of 75 years or older.  The bleeding index is 'intermediate risk'.  Positive CHADS2 values include History of HTN and Age > 1 years old.  The start date was 11/24/2007.  His last INR was 1.6 RATIO.  Anticoagulation responsible provider: Jens Som MD, Arlys John.  INR POC: 2.2.  Cuvette Lot#: [id number].  Exp: 12/2010.    Anticoagulation Management Assessment/Plan:      The patient's current anticoagulation dose is Warfarin sodium 5 mg  tabs: Take as directed by coumadin clinic..  The target INR is 2 - 3.  The next INR is due 10/16/2009.  Anticoagulation instructions were given to patient.  Results were reviewed/authorized by Bethena Midget, RN, BSN.  He was notified by Bethena Midget, RN, BSN.         Prior Anticoagulation Instructions: INR 2.4 Continue 5mg s daily except 2.5mg s on Mondays and Fridays. Recheck in 4 weeks.   Current Anticoagulation Instructions: INR 2.2 Continue 5mg s daily except 2.5mg s on Mondays and Fridays. Recheck in 4 weeks.

## 2010-10-03 NOTE — Miscellaneous (Signed)
  Clinical Lists Changes  Medications: Rx of METOPROLOL SUCCINATE 50 MG XR24H-TAB (METOPROLOL SUCCINATE) 1 tab once daily;  #90 x 3;  Signed;  Entered by: Scherrie Bateman, LPN;  Authorized by: Gaylord Shih, MD, [facility];  Method used: Faxed to [facility], [address] [address], [place], [place]  [address], Ph: [phone], Fax: [phone] Rx of PRAVASTATIN SODIUM 40 MG  TABS (PRAVASTATIN SODIUM) 1 by mouth daily;  #90 x 3;  Signed;  Entered by: Scherrie Bateman, LPN;  Authorized by: Gaylord Shih, MD, [facility];  Method used: Faxed to [facility], [address] [address], [place], [place]  [address], Ph: [phone], Fax: [phone]    Prescriptions: PRAVASTATIN SODIUM 40 MG  TABS (PRAVASTATIN SODIUM) 1 by mouth daily  #90 x 3   Entered by:   Scherrie Bateman, LPN   Authorized by:   Gaylord Shih, MD, [facility]   Signed by:   Scherrie Bateman, LPN on 84/16/6063   Method used:   Faxed to ...       [facility] Environmental education officer)       [address] [address]       [place], [place]  [address]       Ph: [phone]       Fax: [phone]   RxID:   [id number] METOPROLOL SUCCINATE 50 MG XR24H-TAB (METOPROLOL SUCCINATE) 1 tab once daily  #90 x 3   Entered by:   Scherrie Bateman, LPN   Authorized by:   Gaylord Shih, MD, [facility]   Signed by:   Scherrie Bateman, LPN on 71/01/2693   Method used:   Faxed to ...       [facility] Environmental education officer)       [address] [address]       [place], [place]  [address]       Ph: [phone]       Fax: [phone]   RxID:   [id number]

## 2010-10-03 NOTE — Assessment & Plan Note (Signed)
Summary: Pulmonary OV   Copy to:  [name] Primary Provider/Referring Provider:  [facility]   CC:  Pt here for follow up. Pt states breathing is "much better" and productive cough with small amounts of clear thick mucus. Wants to discuss oxygen therapy. Pt needs refill on Nexium for 90 day supply to mail in .  History of Present Illness: 77  yo WM  history of obstructive sleep apnea, asbestosis, asbestos pleural disease.    PFTs have shown severe restrictive lung disease.  June 18, 2010 11:47 AM The pt started brovana and budesonide and this has helped significantly.   The pt is now off oxygen. DYspnea is much better.  No new issues. 9/14: nasal wash helps.  sob is still sob.     not able to go to rehab.  has an ingrown toenail.   Preventive Screening-Counseling & Management  Alcohol-Tobacco     Smoking Status: quit     Packs/Day: 0.75     Year Started: 1942     Year Quit: 1975     Pack years: 30  Current Medications (verified): 1)  Metoprolol Succinate 50 Mg Xr24h-Tab (Metoprolol Succinate) .Marland Kitchen.. 1 Tab Once Daily 2)  Lisinopril 40 Mg  Tabs (Lisinopril) .Marland Kitchen.. 1 By Mouth Daily 3)  Pravastatin Sodium 40 Mg  Tabs (Pravastatin Sodium) .Marland Kitchen.. 1 By Mouth Daily 4)  Adult Aspirin Ec Low Strength 81 Mg  Tbec (Aspirin) .Marland Kitchen.. 1 By Mouth Daily 5)  Multivitamins   Tabs (Multiple Vitamin) .... Take 1 Tablet By Mouth Two Times A Day 6)  Amlodipine Besylate 10 Mg  Tabs (Amlodipine Besylate) .Marland Kitchen.. 1 By Mouth Daily 7)  Cpap .Marland Kitchen.. 12 Cmh20 At Bedtime With Oxygen 2liter 8)  Klor-Con M20 20 Meq Cr-Tabs (Potassium Chloride Crys Cr) .... As Needed 9)  Warfarin Sodium 5 Mg  Tabs (Warfarin Sodium) .... Take As Directed By Coumadin Clinic. 10)  Nexium 40 Mg Cpdr (Esomeprazole Magnesium) .Marland Kitchen.. 1 Cap Once Daily 11)  Furosemide 40 Mg Tabs (Furosemide) .... As Needed 12)  Brovana 15 Mcg/71ml  Nebu (Arformoterol Tartrate) .... One in Nebulizer Twice Daily 13)  Budesonide 0.25 Mg/11ml Susp  (Budesonide) .... One in Nebulizer Twice Daily 14)  Nebulizer  Devi (Respiratory Therapy Supplies) .... Use With Brovana and Budesonide  Allergies (verified): 1)  ! Morphine  Past History:  Past medical, surgical, family and social histories (including risk factors) reviewed, and no changes noted (except as noted below).  Past Medical History: COUMADIN THERAPY (ICD-V58.61) HYPOXEMIA (ICD-799.02) ATRIAL FIBRILLATION (ICD-427.31) CORONARY ARTERY BYPASS GRAFT, HX OF (ICD-V45.81) CORONARY ARTERY DISEASE (ICD-414.00) AORTIC STENOSIS, SEVERE (ICD-424.1) PVD (ICD-443.9) HYPERLIPIDEMIA (ICD-272.4) HYPERTENSION (ICD-401.9) CEREBROVASCULAR DISEASE (ICD-437.9) DYSPNEA (ICD-786.05) COLON CANCER (ICD-V16.0) ALLERGIC RHINITIS, CHRONIC (ICD-477.9) OBSTRUCTIVE SLEEP APNEA (ICD-327.23) ASBESTOSIS (ICD-501)  ---9/14  desaturations w/ activity-started on O2 at 2l/m , eval for portable sys. thru [employer]. Overnight ox--May 16, 2009 --no sign desaturations Degenerative Low Back disease     -Spinal injection Delbert Harness  Past Surgical History: Reviewed history from 01/12/2009 and no changes required. AAA repair in 1992 CABG in 2005  with AVR.Marland Kitchenbypass grafting x 3 L knee surgery Esophagogastroduodenoscopy with removal of impacted food bolus. 12/11/2007   Family History: Reviewed history from 01/12/2009 and no changes required. COPD:  Colon Cancer Family History Lung Cancer Mother: collapsed at a young age, unknown cause of death. Father known heart disease. Family History of Coronary Artery Disease:  Father Siblings:  no known heart disease, positive for cancer  Social History:  Reviewed history from 01/12/2009 and no changes required. Retired  Married  Alcohol Use - no Regular Exercise - yes Drug Use - no Tobacco Use - Former.  Packs/Day:  0.75  Review of Systems       The patient complains of shortness of breath with activity.  The patient denies shortness of  breath at rest, productive cough, non-productive cough, coughing up blood, chest pain, irregular heartbeats, acid heartburn, indigestion, loss of appetite, weight change, abdominal pain, difficulty swallowing, sore throat, tooth/dental problems, headaches, nasal congestion/difficulty breathing through nose, sneezing, itching, ear ache, anxiety, depression, hand/feet swelling, joint stiffness or pain, rash, change in color of mucus, and fever.    Vital Signs:  Patient profile:   77 year old male Height:      67.5 inches Weight:      184.4 pounds BMI:     28.56 O2 Sat:      97 % on Room air Temp:     98.3 degrees F oral Pulse rate:   63 / minute BP sitting:   100 / 70  (left arm) Cuff size:   large  Vitals Entered By: Zackery Barefoot CMA (June 18, 2010 11:35 AM)  O2 Flow:  Room air CC: Pt here for follow up. Pt states breathing is "much better", productive cough with small amounts of clear thick mucus. Wants to discuss oxygen therapy. Pt needs refill on Nexium for 90 day supply to mail in  Comments Medications reviewed with patient Verified contact number and pharmacy with patient Zackery Barefoot CMA  June 18, 2010 11:36 AM    Physical Exam  Additional Exam:  Gen: WD WN     WM     in NAD    NCAT Heent:  no jvd, no TMG, no cervical LNademopathy, orophyx clear,  nares with clear watery drainage. Cor: RRR nl s1/s2  no s3/s4  no m r h g Abd: soft NT BSA   no masses  No HSM  no rebound or guarding Ext perfused with no c v e v.d Neuro: intact, moves all 4s, CN II-XII intact, DTRs intact Chest: distant BS  no wheezes, rales, rhonchi   no egophony  no consolidative breath sounds, mild hyperresonance to percussion Skin: clear  Genital/Rectal :deferred    Impression & Recommendations:  Problem # 1:  COPD (ICD-496) Assessment Unchanged  COPD improved on BDs by Nebs plan cont brovana and budesondie two times a day  flu vaccine today  Medications Added to Medication List This  Visit: 1)  Multivitamins Tabs (Multiple vitamin) .... Take 1 tablet by mouth two times a day 2)  Nebulizer Devi (Respiratory therapy supplies) .... Use with brovana and budesonide 3)  Oxygen  .... 2liters at bedtime and as needed during the day  Complete Medication List: 1)  Metoprolol Succinate 50 Mg Xr24h-tab (Metoprolol succinate) .Marland Kitchen.. 1 tab once daily 2)  Lisinopril 40 Mg Tabs (Lisinopril) .Marland Kitchen.. 1 by mouth daily 3)  Pravastatin Sodium 40 Mg Tabs (Pravastatin sodium) .Marland Kitchen.. 1 by mouth daily 4)  Adult Aspirin Ec Low Strength 81 Mg Tbec (Aspirin) .Marland Kitchen.. 1 by mouth daily 5)  Multivitamins Tabs (Multiple vitamin) .... Take 1 tablet by mouth two times a day 6)  Amlodipine Besylate 10 Mg Tabs (Amlodipine besylate) .Marland Kitchen.. 1 by mouth daily 7)  Cpap  .Marland Kitchen.. 12 cmh20 at bedtime with oxygen 2liter 8)  Klor-con M20 20 Meq Cr-tabs (Potassium chloride crys cr) .... As needed 9)  Warfarin Sodium 5 Mg Tabs (Warfarin sodium) .Marland KitchenMarland KitchenMarland Kitchen  Take as directed by coumadin clinic. 10)  Nexium 40 Mg Cpdr (Esomeprazole magnesium) .Marland Kitchen.. 1 cap once daily 11)  Furosemide 40 Mg Tabs (Furosemide) .... As needed 12)  Brovana 15 Mcg/78ml Nebu (Arformoterol tartrate) .... One in nebulizer twice daily 13)  Budesonide 0.25 Mg/68ml Susp (Budesonide) .... One in nebulizer twice daily 14)  Nebulizer Devi (Respiratory therapy supplies) .... Use with brovana and budesonide 15)  Oxygen  .... 2liters at bedtime and as needed during the day  Other Orders: Flu Vaccine 31yrs + MEDICARE PATIENTS (I6962) Administration Flu vaccine - MCR ([id number])  Patient Instructions: 1)  No change in medications 2)  Flu vaccine will be given 3)  Return in      4    months Prescriptions: NEXIUM 40 MG CPDR (ESOMEPRAZOLE MAGNESIUM) 1 cap once daily  #90 x 4   Entered and Authorized by:   Storm Frisk MD   Signed by:   Storm Frisk MD on 06/18/2010   Method used:   Print then Give to Patient   RxID:   [id number]      Prevention & Chronic  Care Immunizations   Influenza vaccine: Fluvax 3+  (06/18/2010)    Tetanus booster: Not documented    Pneumococcal vaccine: Not documented    H. zoster vaccine: Not documented  Colorectal Screening   Hemoccult: Not documented    Colonoscopy: Not documented  Other Screening   PSA: Not documented   Smoking status: quit  (06/18/2010)  Lipids   Total Cholesterol: Not documented   LDL: Not documented   LDL Direct: Not documented   HDL: Not documented   Triglycerides: Not documented    SGOT (AST): Not documented   SGPT (ALT): Not documented   Alkaline phosphatase: Not documented   Total bilirubin: Not documented  Hypertension   Last Blood Pressure: 100 / 70  (06/18/2010)   Serum creatinine: 1.0  (02/12/2010)   Serum potassium 4.1  (02/12/2010)  Self-Management Support :    Hypertension self-management support: Not documented    Lipid self-management support: Not documented    Nursing Instructions: Give Flu vaccine today              Flu Vaccine Consent Questions     Do you have a history of severe allergic reactions to this vaccine? no    Any prior history of allergic reactions to egg and/or gelatin? no    Do you have a sensitivity to the preservative Thimersol? no    Do you have a past history of Guillan-Barre Syndrome? no    Do you currently have an acute febrile illness? no    Have you ever had a severe reaction to latex? no    Vaccine information given and explained to patient? yes    Are you currently pregnant? no    Lot Number:[id number]   Exp Date:03/01/2011   Site Given  Left Deltoid IMmedflu Zackery Barefoot CMA  June 18, 2010 12:45 PM   Appended Document: Pulmonary OV fax [facility]

## 2010-10-03 NOTE — Assessment & Plan Note (Signed)
Summary: F3M    Visit Type:  3 mo f/u Referring Provider:  Juanito Doom Primary Provider:  [facility]   CC:  no cardiac complaints today....  History of Present Illness: [name] comes in today for chronic disease management of his multiple cardiac and vascular issues.  He still complains of fatigue. Dr. Delford Field has been working with him on his CPAP but he has multiple side effects from it. I'm not sure if not tolerated.  He denies any angina or ischemic symptoms. He's had no symptoms of congestive heart failure. He denies any symptoms of TIAs or mini strokes. He has had no problems with bleeding and his chemistry well regulated. He is becoming a little less sturdy on his feet but has not fallen. He has been warned again about Coumadin and falling.  Current Medications (verified): 1)  Metoprolol Succinate 50 Mg Xr24h-Tab (Metoprolol Succinate) .Marland Kitchen.. 1 Tab Once Daily 2)  Lisinopril 40 Mg  Tabs (Lisinopril) .Marland Kitchen.. 1 By Mouth Daily 3)  Pravastatin Sodium 40 Mg  Tabs (Pravastatin Sodium) .Marland Kitchen.. 1 By Mouth Daily 4)  Adult Aspirin Ec Low Strength 81 Mg  Tbec (Aspirin) .Marland Kitchen.. 1 By Mouth Daily 5)  Multivitamins   Tabs (Multiple Vitamin) .Marland Kitchen.. 1 By Mouth Daily 6)  Amlodipine Besylate 10 Mg  Tabs (Amlodipine Besylate) .Marland Kitchen.. 1 By Mouth Daily 7)  Cpap .Marland Kitchen.. 12 Cmh20 At Bedtime With Oxygen 2liter 8)  Klor-Con M20 20 Meq Cr-Tabs (Potassium Chloride Crys Cr) .... As Needed 9)  Warfarin Sodium 5 Mg  Tabs (Warfarin Sodium) .... Take As Directed By Coumadin Clinic. 10)  Nexium 40 Mg Cpdr (Esomeprazole Magnesium) .Marland Kitchen.. 1 Cap Once Daily 11)  Furosemide 40 Mg Tabs (Furosemide) .... As Needed 12)  Brovana 15 Mcg/7ml  Nebu (Arformoterol Tartrate) .... One in Nebulizer Twice Daily 13)  Budesonide 0.25 Mg/35ml Susp (Budesonide) .... One in Nebulizer Twice Daily 14)  Nebulizer .... Use With Brovana and Budesonide  Allergies: 1)  ! Morphine  Past History:  Past Medical History: Last updated:  08/21/2009 COUMADIN THERAPY (ICD-V58.61) HYPOXEMIA (ICD-799.02) ATRIAL FIBRILLATION (ICD-427.31) CORONARY ARTERY BYPASS GRAFT, HX OF (ICD-V45.81) CORONARY ARTERY DISEASE (ICD-414.00) AORTIC STENOSIS, SEVERE (ICD-424.1) PVD (ICD-443.9) HYPERLIPIDEMIA (ICD-272.4) HYPERTENSION (ICD-401.9) CEREBROVASCULAR DISEASE (ICD-437.9) DYSPNEA (ICD-786.05) COLON CANCER (ICD-V16.0) ALLERGIC RHINITIS, CHRONIC (ICD-477.9) OBSTRUCTIVE SLEEP APNEA (ICD-327.23) ASBESTOSIS (ICD-501)  ---9/14  desaturations w/ activity-started on O2 at 2l/m , eval for portable sys. thru [employer]. Overnight ox--May 16, 2009 --no sign desaturations  Past Surgical History: Last updated: 01/12/2009 AAA repair in 1992 CABG in 2005  with AVR.Marland Kitchenbypass grafting x 3 L knee surgery Esophagogastroduodenoscopy with removal of impacted food bolus. 12/11/2007   Family History: Last updated: 01/12/2009 COPD:  Colon Cancer Family History Lung Cancer Mother: collapsed at a young age, unknown cause of death. Father known heart disease. Family History of Coronary Artery Disease:  Father Siblings:  no known heart disease, positive for cancer  Social History: Last updated: 01/12/2009 Retired  Married  Alcohol Use - no Regular Exercise - yes Drug Use - no Tobacco Use - Former.   Risk Factors: Exercise: yes (01/12/2009)  Risk Factors: Smoking Status: quit (04/30/2010)  Review of Systems       negative other than history of present illness  Vital Signs:  Patient profile:   77 year old male Height:      67.5 inches Weight:      183 pounds Pulse rate:   67 / minute Pulse rhythm:   irregular BP sitting:  130 / 60  (left arm) Cuff size:   large  Vitals Entered By: Danielle Rankin, CMA (May 08, 2010 9:50 AM)  Physical Exam  General:  elderly, depressed, alert oriented x3, no acute distress Head:  normocephalic and atraumatic Eyes:  right eye is red from recent procedure from ophthalmology. Neck:   Neck supple, no JVD. No masses, thyromegaly or abnormal cervical nodes. Chest Wall:  no deformities or breast masses noted Lungs:  Clear bilaterally to auscultation and percussion. Heart:  PMI not displaced, normal S1-S2, soft systolic murmur. No gallop. Right carotid bruit Msk:  decreased ROM.   Pulses:  barely palpable below her extremities. Adequate tissue perfusion. Extremities:  trace left pedal edema and trace right pedal edema.   Neurologic:  Alert and oriented x 3. Skin:  chronic bruising or skin discoloration the upper extremity Psych:  depressed affect.     EKG  Procedure date:  05/08/2010  Findings:      cic atrial fibrillation, incomplete right bundle, no changes.  Impression & Recommendations:  Problem # 1:  ATRIAL FIBRILLATION (ICD-427.31) Assessment Unchanged  His updated medication list for this problem includes:    Metoprolol Succinate 50 Mg Xr24h-tab (Metoprolol succinate) .Marland Kitchen... 1 tab once daily    Adult Aspirin Ec Low Strength 81 Mg Tbec (Aspirin) .Marland Kitchen... 1 by mouth daily    Warfarin Sodium 5 Mg Tabs (Warfarin sodium) .Marland Kitchen... Take as directed by coumadin clinic.  Orders: EKG w/ Interpretation (93000)  Problem # 2:  COUMADIN THERAPY (ICD-V58.61) Assessment: Unchanged  Problem # 3:  CORONARY ARTERY BYPASS GRAFT, HX OF (ICD-V45.81) Assessment: Unchanged  Problem # 4:  CORONARY ARTERY DISEASE (ICD-414.00) Assessment: Unchanged  His updated medication list for this problem includes:    Metoprolol Succinate 50 Mg Xr24h-tab (Metoprolol succinate) .Marland Kitchen... 1 tab once daily    Lisinopril 40 Mg Tabs (Lisinopril) .Marland Kitchen... 1 by mouth daily    Adult Aspirin Ec Low Strength 81 Mg Tbec (Aspirin) .Marland Kitchen... 1 by mouth daily    Amlodipine Besylate 10 Mg Tabs (Amlodipine besylate) .Marland Kitchen... 1 by mouth daily    Warfarin Sodium 5 Mg Tabs (Warfarin sodium) .Marland Kitchen... Take as directed by coumadin clinic.  Orders: EKG w/ Interpretation (93000)  Problem # 5:  PVD (ICD-443.9) Assessment:  Unchanged  Problem # 6:  CEREBROVASCULAR DISEASE (ICD-437.9) Assessment: Unchanged  Problem # 7:  OBSTRUCTIVE SLEEP APNEA (ICD-327.23) Assessment: Unchanged  Problem # 8:  AORTIC STENOSIS, SEVERE (ICD-424.1) Assessment: Unchanged that is post valve replacement. His updated medication list for this problem includes:    Metoprolol Succinate 50 Mg Xr24h-tab (Metoprolol succinate) .Marland Kitchen... 1 tab once daily    Lisinopril 40 Mg Tabs (Lisinopril) .Marland Kitchen... 1 by mouth daily    Furosemide 40 Mg Tabs (Furosemide) .Marland Kitchen... As needed  Patient Instructions: 1)  Your physician recommends that you schedule a follow-up appointment in: 6 months with Dr. Daleen Squibb 2)  Your physician recommends that you continue on your current medications as directed. Please refer to the Current Medication list given to you today.

## 2010-10-03 NOTE — Medication Information (Signed)
Summary: rov/mw      Allergies Added:  Anticoagulant Therapy  Managed by: Reina Fuse, PharmD Referring MD: Valera Castle MD PCP: [facility]  Supervising MD: Clifton James MD, Cristal Deer Indication 1: Atrial Fibrillation (ICD-427.31) Lab Used: [facility] Site: [facility] INR POC 3.0 INR RANGE 2 - 3  Dietary changes: no    Health status changes: no    Bleeding/hemorrhagic complications: no    Recent/future hospitalizations: no    Any changes in medication regimen? no    Recent/future dental: no  Any missed doses?: no       Is patient compliant with meds? yes       Current Medications (verified): 1)  Metoprolol Succinate 50 Mg Xr24h-Tab (Metoprolol Succinate) .Marland Kitchen.. 1 Tab Once Daily 2)  Lisinopril 40 Mg  Tabs (Lisinopril) .Marland Kitchen.. 1 By Mouth Daily 3)  Pravastatin Sodium 40 Mg  Tabs (Pravastatin Sodium) .Marland Kitchen.. 1 By Mouth Daily 4)  Adult Aspirin Ec Low Strength 81 Mg  Tbec (Aspirin) .Marland Kitchen.. 1 By Mouth Daily 5)  Multivitamins   Tabs (Multiple Vitamin) .Marland Kitchen.. 1 By Mouth Daily 6)  Amlodipine Besylate 10 Mg  Tabs (Amlodipine Besylate) .Marland Kitchen.. 1 By Mouth Daily 7)  Cpap .Marland Kitchen.. 12 Cmh20 At Bedtime With Oxygen 2liter 8)  Klor-Con M20 20 Meq Cr-Tabs (Potassium Chloride Crys Cr) .... As Needed 9)  Warfarin Sodium 5 Mg  Tabs (Warfarin Sodium) .... Take As Directed By Coumadin Clinic. 10)  Nexium 40 Mg Cpdr (Esomeprazole Magnesium) .Marland Kitchen.. 1 Cap Once Daily 11)  Furosemide 40 Mg Tabs (Furosemide) .... As Needed 12)  Brovana 15 Mcg/51ml  Nebu (Arformoterol Tartrate) .... One in Nebulizer Twice Daily 13)  Budesonide 0.25 Mg/62ml Susp (Budesonide) .... One in Nebulizer Twice Daily 14)  Nebulizer .... Use With Brovana and Budesonide  Allergies (verified): 1)  ! Morphine  Anticoagulation Management History:      The patient is taking warfarin and comes in today for a routine follow up visit.  Positive risk factors for bleeding include an age of 75 years or older.  The bleeding index is  'intermediate risk'.  Positive CHADS2 values include History of HTN and Age > 50 years old.  The start date was 11/24/2007.  His last INR was 1.6 RATIO.  Anticoagulation responsible provider: Clifton James MD, Cristal Deer.  INR POC: 3.0.  Cuvette Lot#: [id number].  Exp: 07/2011.    Anticoagulation Management Assessment/Plan:      The patient's current anticoagulation dose is Warfarin sodium 5 mg  tabs: Take as directed by coumadin clinic..  The target INR is 2 - 3.  The next INR is due 07/12/2010.  Anticoagulation instructions were given to patient.  Results were reviewed/authorized by Reina Fuse, PharmD.  He was notified by Reina Fuse PharmD.         Prior Anticoagulation Instructions: INR 1.3  Take 1 1/2 tablets today and tomorrow then resume same dose of 1 tablet every day except 1/2 tablet on Monday and Friday. Recheck INR in 7-10 days.   Current Anticoagulation Instructions: INR 3.0  Continue taking Coumadin 5 mg (1 tab) on all days except Coumadin 2.5 mg (0.5 tab) on Mondays and Fridays. Return to clinic in 4 weeks.

## 2010-10-03 NOTE — Medication Information (Signed)
Summary: rov/ewj  Anticoagulant Therapy  Managed by: Cloyde Reams, RN, BSN Referring MD: Valera Castle MD PCP: Dr. Bernell List Supervising MD: Juanda Chance MD, Jackelin Correia Indication 1: Atrial Fibrillation (ICD-427.31) Lab Used: [facility] Site: [facility] INR POC 2.1 INR RANGE 2 - 3  Dietary changes: no    Health status changes: no    Bleeding/hemorrhagic complications: no    Recent/future hospitalizations: no    Any changes in medication regimen? no    Recent/future dental: no  Any missed doses?: no       Is patient compliant with meds? yes       Allergies: 1)  ! Morphine  Anticoagulation Management History:      The patient is taking warfarin and comes in today for a routine follow up visit.  Positive risk factors for bleeding include an age of 75 years or older.  The bleeding index is 'intermediate risk'.  Positive CHADS2 values include History of HTN and Age > 24 years old.  The start date was 11/24/2007.  His last INR was 1.6 RATIO.  Anticoagulation responsible provider: Juanda Chance MD, Smitty Cords.  INR POC: 2.1.  Cuvette Lot#: [id number].  Exp: 05/2011.    Anticoagulation Management Assessment/Plan:      The patient's current anticoagulation dose is Warfarin sodium 5 mg  tabs: Take as directed by coumadin clinic..  The target INR is 2 - 3.  The next INR is due 03/29/2010.  Anticoagulation instructions were given to patient.  Results were reviewed/authorized by Cloyde Reams, RN, BSN.  He was notified by Cloyde Reams RN.         Prior Anticoagulation Instructions: INR 2.6  Continue on same dosage 1 tablet daily except 1/2 tablet on Mondays and Fridays.  Recheck in 4 weeks.    Current Anticoagulation Instructions: INR 2.1  Continue on same dosage 1 tablet daily except 1/2 tablet on Mondays and Fridays.  Recheck in 4 weeks.

## 2010-10-03 NOTE — Assessment & Plan Note (Signed)
Summary: 6 MO F/U .[name]  Medications Added KLOR-CON M20 20 MEQ CR-TABS (POTASSIUM CHLORIDE CRYS CR) 2 TABS once daily FUROSEMIDE 40 MG  TABS (FUROSEMIDE) 1 tab once daily as needed FUROSEMIDE 80 MG TABS (FUROSEMIDE) 1 QD        Visit Type:  6 mo f/u Referring Provider:  Juanito Walsh Primary Provider:  Dr. Bernell List  CC:  edema/ankles....denies any cp ..does have some sob at times.  History of Present Illness: [name] returns today for evaluation and management of his multiple cardiac and vascular issues. Please see the assessment and plan for problem list.  His biggest complaint is lower extremity swelling. He is becoming somewhat uncomfortable. He denies orthopnea.  He's had no angina or significant change in his dyspnea on exertion. He continues to be chronically fatigued. He also bruises easily like to come off of aspirin. He denies any melena or bleeding otherwise.  He denies any claudication  Current Medications (verified): 1)  Metoprolol Succinate 50 Mg Xr24h-Tab (Metoprolol Succinate) .Marland Kitchen.. 1 Tab Once Daily 2)  Lisinopril 40 Mg  Tabs (Lisinopril) .Marland Kitchen.. 1 By Mouth Daily 3)  Pravastatin Sodium 40 Mg  Tabs (Pravastatin Sodium) .Marland Kitchen.. 1 By Mouth Daily 4)  Adult Aspirin Ec Low Strength 81 Mg  Tbec (Aspirin) .Marland Kitchen.. 1 By Mouth Daily 5)  Multivitamins   Tabs (Multiple Vitamin) .Marland Kitchen.. 1 By Mouth Daily 6)  Amlodipine Besylate 10 Mg  Tabs (Amlodipine Besylate) .Marland Kitchen.. 1 By Mouth Daily 7)  Cpap .Marland Kitchen.. 12 Cmh20 At Bedtime 8)  Klor-Con M20 20 Meq  Tbcr (Potassium Chloride Crys Cr) .... As Needed 9)  Furosemide 40 Mg  Tabs (Furosemide) .Marland Kitchen.. 1 Tab Once Daily As Needed 10)  Warfarin Sodium 5 Mg  Tabs (Warfarin Sodium) .... Take As Directed By Coumadin Clinic. 11)  Nexium 40 Mg Cpdr (Esomeprazole Magnesium) .Marland Kitchen.. 1 Cap Once Daily  Allergies: 1)  ! Morphine  Past History:  Past Medical History: Last updated: 08/21/2009 COUMADIN THERAPY (ICD-V58.61) HYPOXEMIA (ICD-799.02) ATRIAL FIBRILLATION  (ICD-427.31) CORONARY ARTERY BYPASS GRAFT, HX OF (ICD-V45.81) CORONARY ARTERY DISEASE (ICD-414.00) AORTIC STENOSIS, SEVERE (ICD-424.1) PVD (ICD-443.9) HYPERLIPIDEMIA (ICD-272.4) HYPERTENSION (ICD-401.9) CEREBROVASCULAR DISEASE (ICD-437.9) DYSPNEA (ICD-786.05) COLON CANCER (ICD-V16.0) ALLERGIC RHINITIS, CHRONIC (ICD-477.9) OBSTRUCTIVE SLEEP APNEA (ICD-327.23) ASBESTOSIS (ICD-501)  ---9/14  desaturations w/ activity-started on O2 at 2l/m , eval for portable sys. thru [employer]. Overnight ox--May 16, 2009 --no sign desaturations  Past Surgical History: Last updated: 01/12/2009 AAA repair in 1992 CABG in 2005  with AVR.Marland Kitchenbypass grafting x 3 L knee surgery Esophagogastroduodenoscopy with removal of impacted food bolus. 12/11/2007   Family History: Last updated: 01/12/2009 COPD:  Colon Cancer Family History Lung Cancer Mother: collapsed at a young age, unknown cause of death. Father known heart disease. Family History of Coronary Artery Disease:  Father Siblings:  no known heart disease, positive for cancer  Social History: Last updated: 01/12/2009 Retired  Married  Alcohol Use - no Regular Exercise - yes Drug Use - no Tobacco Use - Former.   Risk Factors: Exercise: yes (01/12/2009)  Risk Factors: Smoking Status: quit (01/12/2009)  Review of Systems       negative other than history of present illness  Vital Signs:  Patient profile:   77 year old male Height:      67 inches Weight:      190 pounds BMI:     29.87 Pulse rate:   84 / minute Pulse rhythm:   irregular BP sitting:   142 / 70  (left arm) Cuff  size:   large  Vitals Entered By: Danielle Rankin, CMA (February 01, 2010 11:06 AM)  Physical Exam  General:  Well developed, well nourished, in no acute distress. Head:  normocephalic and atraumatic Eyes:  PERRLA/EOM intact; conjunctiva and lids normal. Neck:  Neck supple, no JVD. No masses, thyromegaly or abnormal cervical nodes. Chest [name]:  no  deformities or breast masses noted Lungs:  Clear bilaterally to auscultation and percussion. Heart:  irregular rate and rhythm, variable S1-S2, no diastolic murmur, bilateral carotid sounds right greater than left Msk:  decreased ROM.  decreased ROM.   Pulses:  diminished but palpable the lower extremities, reduced capillary reflux Extremities:  No clubbing or cyanosis, varicose veins, no DVT, dependent rubor Skin:  Intact without lesions or rashes. Psych:  Normal affect.   Problems:  Medical Problems Added: 1)  Dx of Edema  (ICD-782.3)  Impression & Recommendations:  Problem # 1:  EDEMA (ICD-782.3) Assessment Deteriorated This is clearly worse in symptomatic. Increase furosemide to 80 mg each morning and potassium to 40 mEq each morning. Will check electrolytes 7-10 days. Salt restriction  Problem # 2:  ATRIAL FIBRILLATION (ICD-427.31) Assessment: Unchanged Continue rate control and anticoagulation His updated medication list for this problem includes:    Metoprolol Succinate 50 Mg Xr24h-tab (Metoprolol succinate) .Marland Kitchen... 1 tab once daily    Adult Aspirin Ec Low Strength 81 Mg Tbec (Aspirin) .Marland Kitchen... 1 by mouth daily    Warfarin Sodium 5 Mg Tabs (Warfarin sodium) .Marland Kitchen... Take as directed by coumadin clinic.  His updated medication list for this problem includes:    Metoprolol Succinate 50 Mg Xr24h-tab (Metoprolol succinate) .Marland Kitchen... 1 tab once daily    Adult Aspirin Ec Low Strength 81 Mg Tbec (Aspirin) .Marland Kitchen... 1 by mouth daily    Warfarin Sodium 5 Mg Tabs (Warfarin sodium) .Marland Kitchen... Take as directed by coumadin clinic.  Problem # 3:  COUMADIN THERAPY (ICD-V58.61) Assessment: Unchanged  Problem # 4:  CORONARY ARTERY BYPASS GRAFT, HX OF (ICD-V45.81) Assessment: Unchanged Continue medical therapy.  Problem # 5:  AORTIC STENOSIS, SEVERE (ICD-424.1) Assessment: Improved  His updated medication list for this problem includes:    Metoprolol Succinate 50 Mg Xr24h-tab (Metoprolol succinate)  .Marland Kitchen... 1 tab once daily    Lisinopril 40 Mg Tabs (Lisinopril) .Marland Kitchen... 1 by mouth daily    Furosemide 40 Mg Tabs (Furosemide) .Marland Kitchen... 1 tab once daily as needed    Furosemide 80 Mg Tabs (Furosemide) .Marland Kitchen... 1 qd  His updated medication list for this problem includes:    Metoprolol Succinate 50 Mg Xr24h-tab (Metoprolol succinate) .Marland Kitchen... 1 tab once daily    Lisinopril 40 Mg Tabs (Lisinopril) .Marland Kitchen... 1 by mouth daily    Furosemide 40 Mg Tabs (Furosemide) .Marland Kitchen... 1 tab once daily as needed  Problem # 6:  PVD (ICD-443.9) Assessment: Unchanged Continue medical therapy  Problem # 7:  HYPERTENSION (ICD-401.9) Assessment: Improved  His updated medication list for this problem includes:    Metoprolol Succinate 50 Mg Xr24h-tab (Metoprolol succinate) .Marland Kitchen... 1 tab once daily    Lisinopril 40 Mg Tabs (Lisinopril) .Marland Kitchen... 1 by mouth daily    Adult Aspirin Ec Low Strength 81 Mg Tbec (Aspirin) .Marland Kitchen... 1 by mouth daily    Amlodipine Besylate 10 Mg Tabs (Amlodipine besylate) .Marland Kitchen... 1 by mouth daily    Furosemide 40 Mg Tabs (Furosemide) .Marland Kitchen... 1 tab once daily as needed    Furosemide 80 Mg Tabs (Furosemide) .Marland Kitchen... 1 qd  His updated medication list for this problem includes:  Metoprolol Succinate 50 Mg Xr24h-tab (Metoprolol succinate) .Marland Kitchen... 1 tab once daily    Lisinopril 40 Mg Tabs (Lisinopril) .Marland Kitchen... 1 by mouth daily    Adult Aspirin Ec Low Strength 81 Mg Tbec (Aspirin) .Marland Kitchen... 1 by mouth daily    Amlodipine Besylate 10 Mg Tabs (Amlodipine besylate) .Marland Kitchen... 1 by mouth daily    Furosemide 40 Mg Tabs (Furosemide) .Marland Kitchen... 1 tab once daily as needed  Problem # 8:  HYPERLIPIDEMIA (ICD-272.4)  His updated medication list for this problem includes:    Pravastatin Sodium 40 Mg Tabs (Pravastatin sodium) .Marland Kitchen... 1 by mouth daily  His updated medication list for this problem includes:    Pravastatin Sodium 40 Mg Tabs (Pravastatin sodium) .Marland Kitchen... 1 by mouth daily  Problem # 9:  OBSTRUCTIVE SLEEP APNEA (ICD-327.23) Assessment:  Unchanged  Patient Instructions: 1)  Your physician recommends that you schedule a follow-up appointment in: 3 MONTHS WITH DR Ankith Edmonston 2)  Your physician recommends that you return for lab work in:7-10 DAYS BMET V58.69 782.3 3)  Your physician has recommended you make the following change in your medication: INCREASE FUROSEMIDE TO 80 MG once daily 4)  POTASSIUM TO 40 MEQ 1 once daily 5)  LOW SALT DIET  Prescriptions: KLOR-CON M20 20 MEQ CR-TABS (POTASSIUM CHLORIDE CRYS CR) 2 TABS once daily  #60 x 11   Entered by:   Scherrie Bateman, LPN   Authorized by:   Gaylord Shih, MD, [facility]   Signed by:   Scherrie Bateman, LPN on 60/45/4098   Method used:   Electronically to        [facility]* (retail)       [address] [place], [place]  [address]       Ph: [phone]       Fax: [phone]   RxID:   [id number] FUROSEMIDE 80 MG TABS (FUROSEMIDE) 1 QD  #30 x 11   Entered by:   Scherrie Bateman, LPN   Authorized by:   Gaylord Shih, MD, [facility]   Signed by:   Scherrie Bateman, LPN on 53/66/4403   Method used:   Electronically to        [facility]* (retail)       [address] [place], [place]  [address]       Ph: [phone]       Fax: [phone]   RxID:   [id number] FUROSEMIDE 40 MG  TABS (FUROSEMIDE) 1 tab once daily as needed  #30 x 11   Entered by:   Danielle Rankin, CMA   Authorized by:   Gaylord Shih, MD, [facility]   Signed by:   Danielle Rankin, CMA on 02/01/2010   Method used:   Electronically to        [facility]* (retail)       [address]       [place], [place]  [address]       Ph: [phone]       Fax: [phone]   RxID:   [id number]

## 2010-10-03 NOTE — Medication Information (Signed)
Summary: rov/ewj  Anticoagulant Therapy  Managed by: Weston Brass, PharmD Referring MD: Valera Castle MD PCP: Dr. Bernell List Supervising MD: Eden Emms MD, Theron Arista Indication 1: Atrial Fibrillation (ICD-427.31) Lab Used: [facility] Site: [facility] INR POC 2.3 INR RANGE 2 - 3  Dietary changes: no    Health status changes: no    Bleeding/hemorrhagic complications: no    Recent/future hospitalizations: no    Any changes in medication regimen? no    Recent/future dental: no  Any missed doses?: no       Is patient compliant with meds? yes       Allergies: 1)  ! Morphine  Anticoagulation Management History:      The patient is taking warfarin and comes in today for a routine follow up visit.  Positive risk factors for bleeding include an age of 75 years or older.  The bleeding index is 'intermediate risk'.  Positive CHADS2 values include History of HTN and Age > 75 years old.  The start date was 11/24/2007.  His last INR was 1.6 RATIO.  Anticoagulation responsible provider: Eden Emms MD, Theron Arista.  INR POC: 2.3.  Cuvette Lot#: [id number].  Exp: 06/2011.    Anticoagulation Management Assessment/Plan:      The patient's current anticoagulation dose is Warfarin sodium 5 mg  tabs: Take as directed by coumadin clinic..  The target INR is 2 - 3.  The next INR is due 04/26/2010.  Anticoagulation instructions were given to patient.  Results were reviewed/authorized by Weston Brass, PharmD.  He was notified by Weston Brass PharmD.         Prior Anticoagulation Instructions: INR 2.1  Continue on same dosage 1 tablet daily except 1/2 tablet on Mondays and Fridays.  Recheck in 4 weeks.    Current Anticoagulation Instructions: INR 2.3  Continue same dose of 1 tablet every day except 1/2 tablet on Monday and Friday.

## 2010-10-03 NOTE — Progress Notes (Signed)
Summary: [facility] state sent rx's today to pt   Phone Note Outgoing Call Call back at [facility]   Summary of Call: CMA s/w [facility] rep today asking why are we still recving fax when med was sent on the 16th though was not correct and resent on the 20th. Rep states pt's meds have been sent out earlier today. Danielle Rankin, CMA  September 26, 2010 1:21 PM

## 2010-10-03 NOTE — Letter (Signed)
Summary: CMN for Nebulizer / [facility]  CMN for [facility] / [facility]   Imported By: Lennie Odor 06/17/2010 15:54:46  _____________________________________________________________________  External Attachment:    Type:   Image     Comment:   External Document

## 2010-10-03 NOTE — Medication Information (Signed)
Summary: rov/sp   Anticoagulant Therapy  Managed by: Weston Brass, PharmD Referring MD: Valera Castle MD PCP: [facility]  Supervising MD: Tenny Craw MD, Gunnar Fusi Indication 1: Atrial Fibrillation (ICD-427.31) Lab Used: [facility] Site: [facility] INR POC 2.5 INR RANGE 2 - 3  Dietary changes: no    Health status changes: no    Bleeding/hemorrhagic complications: no    Recent/future hospitalizations: no    Any changes in medication regimen? no    Recent/future dental: no  Any missed doses?: yes     Details: was off for steroid injection on 12/7   Comments: Requested pt return in 3 weeks.  He insisted on 4 weeks.  Aware of risks.   Allergies: 1)  ! Morphine  Anticoagulation Management History:      The patient is taking warfarin and comes in today for a routine follow up visit.  Positive risk factors for bleeding include an age of 5 years or older.  The bleeding index is 'intermediate risk'.  Positive CHADS2 values include History of HTN and Age > 51 years old.  The start date was 11/24/2007.  His last INR was 1.6 RATIO.  Anticoagulation responsible provider: Tenny Craw MD, Gunnar Fusi.  INR POC: 2.5.  Exp: 10/2011.    Anticoagulation Management Assessment/Plan:      The patient's current anticoagulation dose is Warfarin sodium 5 mg  tabs: Take as directed by coumadin clinic..  The target INR is 2 - 3.  The next INR is due 09/16/2010.  Anticoagulation instructions were given to patient.  Results were reviewed/authorized by Weston Brass, PharmD.  He was notified by Weston Brass PharmD.         Prior Anticoagulation Instructions: INR 1.2  Take 1 1/2 tablets today and tomorrow then resume same dose of 1 tablet every day excpet 1/2 tablet on Monday and Friday.  Recheck INR in 10-14 days.   Current Anticoagulation Instructions: INR 2.5  Continue same dose of 1 tablet every day except 1/2 tablet on Monday and Friday.  Recheck INR in 4 weeks.

## 2010-10-03 NOTE — Medication Information (Signed)
Summary: rov/ewj  Anticoagulant Therapy  Managed by: Weston Brass, PharmD Referring MD: Valera Castle MD PCP: Dr. Bernell List Supervising MD: Excell Seltzer MD, Casimiro Needle Indication 1: Atrial Fibrillation (ICD-427.31) Lab Used: [facility] Site: [facility] INR POC 3.1 INR RANGE 2 - 3  Dietary changes: no    Health status changes: no    Bleeding/hemorrhagic complications: no    Recent/future hospitalizations: no    Any changes in medication regimen? no    Recent/future dental: no  Any missed doses?: no       Is patient compliant with meds? yes       Allergies: 1)  ! Morphine  Anticoagulation Management History:      The patient is taking warfarin and comes in today for a routine follow up visit.  Positive risk factors for bleeding include an age of 38 years or older.  The bleeding index is 'intermediate risk'.  Positive CHADS2 values include History of HTN and Age > 26 years old.  The start date was 11/24/2007.  His last INR was 1.6 RATIO.  Anticoagulation responsible provider: Excell Seltzer MD, Casimiro Needle.  INR POC: 3.1.  Exp: 12/2010.    Anticoagulation Management Assessment/Plan:      The patient's current anticoagulation dose is Warfarin sodium 5 mg  tabs: Take as directed by coumadin clinic..  The target INR is 2 - 3.  The next INR is due 01/01/2010.  Anticoagulation instructions were given to patient.  Results were reviewed/authorized by Weston Brass, PharmD.  He was notified by Weston Brass PharmD.         Prior Anticoagulation Instructions: INR 3.0  Continue on same dosage 1 tablet daily except 1/2 tablet on Mondays and Fridays.  Recheck in 4 weeks.    Current Anticoagulation Instructions: INR 3.1  Take 1/2 tablet tomorrow then resume same dose of 1 tablet every day except 1/2 tablet on Monday and Friday

## 2010-10-03 NOTE — Medication Information (Signed)
Summary: rov coumadin - [facility]  Anticoagulant Therapy  Managed by: Cloyde Reams, RN, BSN Referring MD: Valera Castle MD PCP: Dr. Bernell List Supervising MD: Excell Seltzer MD, Casimiro Needle Indication 1: Atrial Fibrillation (ICD-427.31) Lab Used: [facility] Site: [facility] INR POC 2.6 INR RANGE 2 - 3  Dietary changes: no    Health status changes: no    Bleeding/hemorrhagic complications: yes       Details: Incr bruising  Recent/future hospitalizations: no    Any changes in medication regimen? no    Recent/future dental: no  Any missed doses?: no       Is patient compliant with meds? yes       Allergies: 1)  ! Morphine  Anticoagulation Management History:      The patient is taking warfarin and comes in today for a routine follow up visit.  Positive risk factors for bleeding include an age of 75 years or older.  The bleeding index is 'intermediate risk'.  Positive CHADS2 values include History of HTN and Age > 24 years old.  The start date was 11/24/2007.  His last INR was 1.6 RATIO.  Anticoagulation responsible provider: Excell Seltzer MD, Casimiro Needle.  INR POC: 2.6.  Cuvette Lot#: [id number].  Exp: 04/2011.    Anticoagulation Management Assessment/Plan:      The patient's current anticoagulation dose is Warfarin sodium 5 mg  tabs: Take as directed by coumadin clinic..  The target INR is 2 - 3.  The next INR is due 03/01/2010.  Anticoagulation instructions were given to patient.  Results were reviewed/authorized by Cloyde Reams, RN, BSN.  He was notified by Cloyde Reams RN.         Prior Anticoagulation Instructions: INR 2.7  Coumadin 1 tab = 5mg  each day EXCEPT 1/2 tab on Mon and Fri  Current Anticoagulation Instructions: INR 2.6  Continue on same dosage 1 tablet daily except 1/2 tablet on Mondays and Fridays.  Recheck in 4 weeks.

## 2010-10-03 NOTE — Medication Information (Signed)
Summary: rov/sp   Anticoagulant Therapy  Managed by: Weston Brass, PharmD Referring MD: Valera Castle MD PCP: [facility]  Supervising MD: Clifton James MD, Cristal Deer Indication 1: Atrial Fibrillation (ICD-427.31) Lab Used: [facility] Site: [facility] INR POC 1.3 INR RANGE 2 - 3  Dietary changes: no    Health status changes: yes       Details: off Coumadin for spinal injection   Bleeding/hemorrhagic complications: no    Recent/future hospitalizations: no    Any changes in medication regimen? no    Recent/future dental: no  Any missed doses?: yes     Details: took last dose of Coumadin on Sunday   Is patient compliant with meds? yes       Allergies: 1)  ! Morphine  Anticoagulation Management History:      The patient is taking warfarin and comes in today for a routine follow up visit.  Positive risk factors for bleeding include an age of 75 years or older.  The bleeding index is 'intermediate risk'.  Positive CHADS2 values include History of HTN and Age > 69 years old.  The start date was 11/24/2007.  His last INR was 1.6 RATIO.  Anticoagulation responsible provider: Clifton James MD, Cristal Deer.  INR POC: 1.3.  Cuvette Lot#: [id number].  Exp: 07/2011.    Anticoagulation Management Assessment/Plan:      The patient's current anticoagulation dose is Warfarin sodium 5 mg  tabs: Take as directed by coumadin clinic..  The target INR is 2 - 3.  The next INR is due 06/14/2010.  Anticoagulation instructions were given to patient.  Results were reviewed/authorized by Weston Brass, PharmD.  He was notified by Weston Brass PharmD.         Prior Anticoagulation Instructions: INR 2.9  No changes today. Take 1 tablet everyday except on monday and friday. On monday and friday take a half tablet. Recheck in 4 weeks.   Current Anticoagulation Instructions: INR 1.3  Take 1 1/2 tablets today and tomorrow then resume same dose of 1 tablet every day except 1/2 tablet on Monday and Friday.  Recheck INR in 7-10 days.

## 2010-10-03 NOTE — Assessment & Plan Note (Signed)
Summary: Pulmonary OV   Copy to:  Juanito Doom Primary Janani Chamber/Referring Shakura Cowing:  [facility]   CC:  Follow up.  Last seen by PW 05/2008 and TP 05/2009.  Breathing gradually getting worse.  Having increased SOB with activity and using o2 more frequently when doing activities outside.  Prod cough with yellow -- worse in mornings.  Denies wheezing and chest tightness. Marland Kitchen  History of Present Illness: 77  yo WM  history of obstructive sleep apnea, asbestosis, asbestos pleural disease.    PFTs have shown severe restrictive lung disease.  05/1408 Last OV 02/29/08.   Since last OV still dyspneic.  PN drip is better with saline lavage.  No cough.  No chest pain.  No wheezing.    May 15, 2009--Presents for a work in visit. Complains of increased SOB with exertion, states he saw Dr. Nobie Putnam in [place] who told him that he needs o2 and wanted to come in for our opinion. Has had progressively worsened DOE over last 6 months. Can only walk 100 ft without having to stop.  Normal activity is rides lawnmower, light yardwork, weedeating, has to rest intermittently for then restarts. Wears CPAP all night long.   April 30, 2010 10:46 AM Not doing well, gradually worse with time.  Notes some cough.  No wheeze.  Yellow mucus esp in the early am.   No chest pain.  Now on oxygen at night with cpap, uses oxygen daytime with exertion. Pt now is worse with cough and dyspnea.  Has not done well with DPIs or HFAs in the past 9/14: nasal wash helps.  sob is still sob.     not able to go to rehab.  has an ingrown toenail.   Preventive Screening-Counseling & Management  Alcohol-Tobacco     Smoking Status: quit     Year Quit: 1975     Pack years: 30  Current Medications (verified): 1)  Metoprolol Succinate 50 Mg Xr24h-Tab (Metoprolol Succinate) .Marland Kitchen.. 1 Tab Once Daily 2)  Lisinopril 40 Mg  Tabs (Lisinopril) .Marland Kitchen.. 1 By Mouth Daily 3)  Pravastatin Sodium 40 Mg  Tabs (Pravastatin Sodium) .Marland Kitchen..  1 By Mouth Daily 4)  Adult Aspirin Ec Low Strength 81 Mg  Tbec (Aspirin) .Marland Kitchen.. 1 By Mouth Daily 5)  Multivitamins   Tabs (Multiple Vitamin) .Marland Kitchen.. 1 By Mouth Daily 6)  Amlodipine Besylate 10 Mg  Tabs (Amlodipine Besylate) .Marland Kitchen.. 1 By Mouth Daily 7)  Cpap .Marland Kitchen.. 12 Cmh20 At Bedtime 8)  Klor-Con M20 20 Meq Cr-Tabs (Potassium Chloride Crys Cr) .... As Needed 9)  Warfarin Sodium 5 Mg  Tabs (Warfarin Sodium) .... Take As Directed By Coumadin Clinic. 10)  Nexium 40 Mg Cpdr (Esomeprazole Magnesium) .Marland Kitchen.. 1 Cap Once Daily 11)  Furosemide 40 Mg Tabs (Furosemide) .... As Needed  Allergies (verified): 1)  ! Morphine  Past History:  Past medical, surgical, family and social histories (including risk factors) reviewed, and no changes noted (except as noted below).  Past Medical History: Reviewed history from 08/21/2009 and no changes required. COUMADIN THERAPY (ICD-V58.61) HYPOXEMIA (ICD-799.02) ATRIAL FIBRILLATION (ICD-427.31) CORONARY ARTERY BYPASS GRAFT, HX OF (ICD-V45.81) CORONARY ARTERY DISEASE (ICD-414.00) AORTIC STENOSIS, SEVERE (ICD-424.1) PVD (ICD-443.9) HYPERLIPIDEMIA (ICD-272.4) HYPERTENSION (ICD-401.9) CEREBROVASCULAR DISEASE (ICD-437.9) DYSPNEA (ICD-786.05) COLON CANCER (ICD-V16.0) ALLERGIC RHINITIS, CHRONIC (ICD-477.9) OBSTRUCTIVE SLEEP APNEA (ICD-327.23) ASBESTOSIS (ICD-501)  ---9/14  desaturations w/ activity-started on O2 at 2l/m , eval for portable sys. thru [employer]. Overnight ox--May 16, 2009 --no sign desaturations  Past Surgical History: Reviewed history  from 01/12/2009 and no changes required. AAA repair in 1992 CABG in 2005  with AVR.Marland Kitchenbypass grafting x 3 L knee surgery Esophagogastroduodenoscopy with removal of impacted food bolus. 12/11/2007   Family History: Reviewed history from 01/12/2009 and no changes required. COPD:  Colon Cancer Family History Lung Cancer Mother: collapsed at a young age, unknown cause of death. Father known heart  disease. Family History of Coronary Artery Disease:  Father Siblings:  no known heart disease, positive for cancer  Social History: Reviewed history from 01/12/2009 and no changes required. Retired  Married  Alcohol Use - no Regular Exercise - yes Drug Use - no Tobacco Use - Former.   Review of Systems       The patient complains of shortness of breath with activity and non-productive cough.  The patient denies shortness of breath at rest, productive cough, coughing up blood, chest pain, irregular heartbeats, acid heartburn, indigestion, loss of appetite, weight change, abdominal pain, difficulty swallowing, sore throat, tooth/dental problems, headaches, nasal congestion/difficulty breathing through nose, sneezing, itching, ear ache, anxiety, depression, hand/feet swelling, joint stiffness or pain, rash, change in color of mucus, and fever.    Vital Signs:  Patient profile:   77 year old male Height:      67.5 inches Weight:      187.38 pounds BMI:     29.02 O2 Sat:      98 % on Room air Temp:     97.6 degrees F oral BP sitting:   122 / 62  (left arm) Cuff size:   regular  Vitals Entered By: Gweneth Dimitri RN (April 30, 2010 10:41 AM)  O2 Flow:  Room air CC: Follow up.  Last seen by PW 05/2008 and TP 05/2009.  Breathing gradually getting worse.  Having increased SOB with activity and using o2 more frequently when doing activities outside.  Prod cough with yellow -- worse in mornings.  Denies wheezing and chest tightness.  Comments Medications reviewed with patient Daytime contact number verified with patient. Crystal Jones RN  April 30, 2010 10:42 AM    Physical Exam  Additional Exam:  Gen: WD WN     WM     in NAD    NCAT Heent:  no jvd, no TMG, no cervical LNademopathy, orophyx clear,  nares with clear watery drainage. Cor: RRR nl s1/s2  no s3/s4  no m r h g Abd: soft NT BSA   no masses  No HSM  no rebound or guarding Ext perfused with no c v e v.d Neuro: intact, moves  all 4s, CN II-XII intact, DTRs intact Chest: distant BS  no wheezes, rales, rhonchi   no egophony  no consolidative breath sounds, mild hyperresonance to percussion Skin: clear  Genital/Rectal :deferred    Impression & Recommendations:  Problem # 1:  COPD (ICD-496) Assessment Deteriorated COPD with ongoing airway inflammation ,unable to use DPI or HFA inhalers plan start nebulizer with brovana/budesonide two times a day  Medications Added to Medication List This Visit: 1)  Cpap  .Marland Kitchen.. 12 cmh20 at bedtime with oxygen 2liter 2)  Klor-con M20 20 Meq Cr-tabs (Potassium chloride crys cr) .... As needed 3)  Furosemide 40 Mg Tabs (Furosemide) .... As needed 4)  Brovana 15 Mcg/62ml Nebu (Arformoterol tartrate) .... One in nebulizer twice daily 5)  Budesonide 0.25 Mg/26ml Susp (Budesonide) .... One in nebulizer twice daily 6)  Nebulizer  .... Use with brovana and budesonide  Complete Medication List: 1)  Metoprolol Succinate 50 Mg Xr24h-tab (  Metoprolol succinate) .Marland Kitchen.. 1 tab once daily 2)  Lisinopril 40 Mg Tabs (Lisinopril) .Marland Kitchen.. 1 by mouth daily 3)  Pravastatin Sodium 40 Mg Tabs (Pravastatin sodium) .Marland Kitchen.. 1 by mouth daily 4)  Adult Aspirin Ec Low Strength 81 Mg Tbec (Aspirin) .Marland Kitchen.. 1 by mouth daily 5)  Multivitamins Tabs (Multiple vitamin) .Marland Kitchen.. 1 by mouth daily 6)  Amlodipine Besylate 10 Mg Tabs (Amlodipine besylate) .Marland Kitchen.. 1 by mouth daily 7)  Cpap  .Marland Kitchen.. 12 cmh20 at bedtime with oxygen 2liter 8)  Klor-con M20 20 Meq Cr-tabs (Potassium chloride crys cr) .... As needed 9)  Warfarin Sodium 5 Mg Tabs (Warfarin sodium) .... Take as directed by coumadin clinic. 10)  Nexium 40 Mg Cpdr (Esomeprazole magnesium) .Marland Kitchen.. 1 cap once daily 11)  Furosemide 40 Mg Tabs (Furosemide) .... As needed 12)  Brovana 15 Mcg/82ml Nebu (Arformoterol tartrate) .... One in nebulizer twice daily 13)  Budesonide 0.25 Mg/29ml Susp (Budesonide) .... One in nebulizer twice daily 14)  Nebulizer  .... Use with brovana and  budesonide  Other Orders: Est. Patient Level IV (04540) DME Referral (DME)  Patient Instructions: 1)  Start Brovana and budesonide in nebulizer twice daily from [facility] 2)  No other medication changes 3)  Return 6 weeks for recheck Prescriptions: NEBULIZER Use with brovana and budesonide  #1 x 0   Entered and Authorized by:   Storm Frisk MD   Signed by:   Storm Frisk MD on 04/30/2010   Method used:   Print then Give to Patient   RxID:   [id number] BUDESONIDE 0.25 MG/2ML SUSP (BUDESONIDE) One in nebulizer twice daily  #60 x 6   Entered and Authorized by:   Storm Frisk MD   Signed by:   Storm Frisk MD on 04/30/2010   Method used:   Electronically to        [facility]* (retail)       [address] [place], [place]  [address]       Ph: [phone]       Fax: [phone]   RxID:   [id number] BROVANA 15 MCG/2ML  NEBU (ARFORMOTEROL TARTRATE) One in nebulizer twice daily  #60 x 6   Entered and Authorized by:   Storm Frisk MD   Signed by:   Storm Frisk MD on 04/30/2010   Method used:   Electronically to        [facility]* (retail)       [address] [place], [place]  [address]       Ph: [phone]       Fax: [phone]   RxID:   [id number]   Appended Document: Pulmonary OV fax [facility]

## 2010-10-05 DIAGNOSIS — R413 Other amnesia: Secondary | ICD-10-CM | POA: Insufficient documentation

## 2010-10-09 NOTE — Assessment & Plan Note (Addendum)
Summary: Pulmonary OV   Vital Signs:  Patient profile:   77 year old male Height:      67.5 inches Weight:      191.38 pounds BMI:     29.64 O2 Sat:      94 % on Room air Temp:     98.3 degrees F oral Pulse rate:   75 / minute BP sitting:   118 / 60  (left arm) Cuff size:   regular  Vitals Entered By: Gweneth Dimitri RN (October 03, 2010 2:06 PM)  O2 Flow:  Room air CC: 4 month follow up.  Pt states he does have SOB with exertion but this is unchanged from last OV.  Dry cough. Denies wheezing, chest tightness.  Confusion - onset when starting o2., Hypertension Management Comments Medications reviewed with patient Daytime contact number verified with patient. Gweneth Dimitri RN  October 03, 2010 2:08 PM    Copy to:  Juanito Doom Primary Provider/Referring Provider:  [facility]   CC:  4 month follow up.  Pt states he does have SOB with exertion but this is unchanged from last OV.  Dry cough. Denies wheezing, chest tightness.  Confusion - onset when starting o2., and Hypertension Management.  History of Present Illness: 77  yo WM  history of obstructive sleep apnea, asbestosis, asbestos pleural disease.    PFTs have shown severe restrictive lung disease.  June 18, 2010 11:47 AM The pt started brovana and budesonide and this has helped significantly.   The pt is now off oxygen. DYspnea is much better.  No new issues.   October 03, 2010 2:15 PM f/u copd, pt is getting more forgetful.  Gets up confused in the am.   No new issues on breathing   9/14: nasal wash helps.  sob is still sob.     not able to go to rehab.  has an ingrown toenail.   Hypertension History:      Positive major cardiovascular risk factors include male age 19 years old or older, hyperlipidemia, and hypertension.  Negative major cardiovascular risk factors include non-tobacco-user status.        Positive history for target organ damage include ASHD (either angina/prior MI/prior CABG) and  peripheral vascular disease.     Current Medications (verified): 1)  Metoprolol Succinate 50 Mg Xr24h-Tab (Metoprolol Succinate) .Marland Kitchen.. 1 Tab Once Daily 2)  Lisinopril 40 Mg  Tabs (Lisinopril) .Marland Kitchen.. 1 By Mouth Daily 3)  Pravastatin Sodium 40 Mg  Tabs (Pravastatin Sodium) .Marland Kitchen.. 1 By Mouth Daily 4)  Adult Aspirin Ec Low Strength 81 Mg  Tbec (Aspirin) .Marland Kitchen.. 1 By Mouth Daily 5)  Multivitamins   Tabs (Multiple Vitamin) .... Take 1 Tablet By Mouth Two Times A Day 6)  Amlodipine Besylate 10 Mg  Tabs (Amlodipine Besylate) .Marland Kitchen.. 1 By Mouth Daily 7)  Cpap .Marland Kitchen.. 12 Cmh20 At Bedtime With Oxygen 2liter 8)  Klor-Con M20 20 Meq Cr-Tabs (Potassium Chloride Crys Cr) .... As Needed 9)  Warfarin Sodium 5 Mg  Tabs (Warfarin Sodium) .... Take As Directed By Coumadin Clinic. 10)  Nexium 40 Mg Cpdr (Esomeprazole Magnesium) .Marland Kitchen.. 1 Cap Once Daily 11)  Furosemide 40 Mg Tabs (Furosemide) .... As Needed 12)  Brovana 15 Mcg/72ml  Nebu (Arformoterol Tartrate) .... One in Nebulizer Twice Daily 13)  Budesonide 0.25 Mg/20ml Susp (Budesonide) .... One in Nebulizer Twice Daily 14)  Nebulizer  Devi (Respiratory Therapy Supplies) .... Use With Brovana and Budesonide 15)  Oxygen .... 2liters  At Bedtime and As Needed During The Day  Allergies (verified): 1)  ! Morphine  Past History:  Past medical, surgical, family and social histories (including risk factors) reviewed, and no changes noted (except as noted below).  Past Medical History: Reviewed history from 06/18/2010 and no changes required. COUMADIN THERAPY (ICD-V58.61) HYPOXEMIA (ICD-799.02) ATRIAL FIBRILLATION (ICD-427.31) CORONARY ARTERY BYPASS GRAFT, HX OF (ICD-V45.81) CORONARY ARTERY DISEASE (ICD-414.00) AORTIC STENOSIS, SEVERE (ICD-424.1) PVD (ICD-443.9) HYPERLIPIDEMIA (ICD-272.4) HYPERTENSION (ICD-401.9) CEREBROVASCULAR DISEASE (ICD-437.9) DYSPNEA (ICD-786.05) COLON CANCER (ICD-V16.0) ALLERGIC RHINITIS, CHRONIC (ICD-477.9) OBSTRUCTIVE SLEEP APNEA  (ICD-327.23) ASBESTOSIS (ICD-501)  ---9/14  desaturations w/ activity-started on O2 at 2l/m , eval for portable sys. thru [employer]. Overnight ox--May 16, 2009 --no sign desaturations Degenerative Low Back disease     -Spinal injection Delbert Harness  Past Surgical History: Reviewed history from 01/12/2009 and no changes required. AAA repair in 1992 CABG in 2005  with AVR.Marland Kitchenbypass grafting x 3 L knee surgery Esophagogastroduodenoscopy with removal of impacted food bolus. 12/11/2007   Family History: Reviewed history from 01/12/2009 and no changes required. COPD:  Colon Cancer Family History Lung Cancer Mother: collapsed at a young age, unknown cause of death. Father known heart disease. Family History of Coronary Artery Disease:  Father Siblings:  no known heart disease, positive for cancer  Social History: Reviewed history from 01/12/2009 and no changes required. Retired  Married  Alcohol Use - no Regular Exercise - yes Drug Use - no Tobacco Use - Former.   Review of Systems       The patient complains of shortness of breath with activity.  The patient denies shortness of breath at rest, productive cough, non-productive cough, coughing up blood, chest pain, irregular heartbeats, acid heartburn, indigestion, loss of appetite, weight change, abdominal pain, difficulty swallowing, sore throat, tooth/dental problems, headaches, nasal congestion/difficulty breathing through nose, sneezing, itching, ear ache, anxiety, depression, hand/feet swelling, joint stiffness or pain, rash, change in color of mucus, and fever.         increased confusion, forgetfulness  Physical Exam  Additional Exam:  Gen: WD WN     WM     in NAD    NCAT Heent:  no jvd, no TMG, no cervical LNademopathy, orophyx clear,  nares with clear watery drainage. Cor: RRR nl s1/s2  no s3/s4  no m r h g Abd: soft NT BSA   no masses  No HSM  no rebound or guarding Ext perfused with no c v e v.d Neuro:  intact, moves all 4s, CN II-XII intact, DTRs intact Chest: distant BS  no wheezes, rales, rhonchi   no egophony  no consolidative breath sounds, mild hyperresonance to percussion Skin: clear  Genital/Rectal :deferred    Impression & Recommendations:  Problem # 1:  COPD (ICD-496) Assessment Unchanged copd stable  plan cont inhaled BDs via nebulizer  Problem # 2:  MEMORY LOSS (ICD-780.93) Assessment: Unchanged ongoing memory loss plan I spoke to PCP regarding this issue. ?dementia  Complete Medication List: 1)  Metoprolol Succinate 50 Mg Xr24h-tab (Metoprolol succinate) .Marland Kitchen.. 1 tab once daily 2)  Lisinopril 40 Mg Tabs (Lisinopril) .Marland Kitchen.. 1 by mouth daily 3)  Pravastatin Sodium 40 Mg Tabs (Pravastatin sodium) .Marland Kitchen.. 1 by mouth daily 4)  Adult Aspirin Ec Low Strength 81 Mg Tbec (Aspirin) .Marland Kitchen.. 1 by mouth daily 5)  Multivitamins Tabs (Multiple vitamin) .... Take 1 tablet by mouth two times a day 6)  Amlodipine Besylate 10 Mg Tabs (Amlodipine besylate) .Marland Kitchen.. 1 by mouth daily 7)  Cpap  .Marland Kitchen.. 12 cmh20 at bedtime with oxygen 2liter 8)  Klor-con M20 20 Meq Cr-tabs (Potassium chloride crys cr) .... As needed 9)  Warfarin Sodium 5 Mg Tabs (Warfarin sodium) .... Take as directed by coumadin clinic. 10)  Nexium 40 Mg Cpdr (Esomeprazole magnesium) .Marland Kitchen.. 1 cap once daily 11)  Furosemide 40 Mg Tabs (Furosemide) .... As needed 12)  Brovana 15 Mcg/41ml Nebu (Arformoterol tartrate) .... One in nebulizer twice daily 13)  Budesonide 0.25 Mg/75ml Susp (Budesonide) .... One in nebulizer twice daily 14)  Nebulizer Devi (Respiratory therapy supplies) .... Use with brovana and budesonide 15)  Oxygen  .... 2liters at bedtime and as needed during the day  Other Orders: Est. Patient Level III (78295)  Hypertension Assessment/Plan:      The patient's hypertensive risk group is category C: Target organ damage and/or diabetes.  Today's blood pressure is 118/60.    Patient Instructions: 1)  No change in  medications 2)  Return in      4    months   Orders Added: 1)  Est. Patient Level III [62130]  Appended Document: Pulmonary OV fax [facility]

## 2010-10-14 ENCOUNTER — Encounter (INDEPENDENT_AMBULATORY_CARE_PROVIDER_SITE_OTHER): Payer: Medicare Other

## 2010-10-14 ENCOUNTER — Encounter: Payer: Self-pay | Admitting: Cardiology

## 2010-10-14 DIAGNOSIS — I4891 Unspecified atrial fibrillation: Secondary | ICD-10-CM

## 2010-10-14 DIAGNOSIS — Z7901 Long term (current) use of anticoagulants: Secondary | ICD-10-CM

## 2010-10-23 NOTE — Medication Information (Signed)
Summary: Coumadin Clinic  Anticoagulant Therapy  Managed by: Cloyde Reams, RN, BSN Referring MD: Valera Castle MD PCP: [facility]  Supervising MD: Jens Som MD, Arlys John Indication 1: Atrial Fibrillation (ICD-427.31) Lab Used: [facility] Site: [facility] INR POC 2.6 INR RANGE 2 - 3  Dietary changes: no    Health status changes: no    Bleeding/hemorrhagic complications: no    Recent/future hospitalizations: no    Any changes in medication regimen? no    Recent/future dental: no  Any missed doses?: no       Is patient compliant with meds? yes       Allergies: 1)  ! Morphine  Anticoagulation Management History:      The patient is taking warfarin and comes in today for a routine follow up visit.  Positive risk factors for bleeding include an age of 75 years or older.  The bleeding index is 'intermediate risk'.  Positive CHADS2 values include History of HTN and Age > 26 years old.  The start date was 11/24/2007.  His last INR was 1.6 RATIO.  Anticoagulation responsible provider: Jens Som MD, Arlys John.  INR POC: 2.6.  Cuvette Lot#: [id number].  Exp: 09/2011.    Anticoagulation Management Assessment/Plan:      The patient's current anticoagulation dose is Warfarin sodium 5 mg  tabs: Take as directed by coumadin clinic..  The target INR is 2 - 3.  The next INR is due 11/11/2010.  Anticoagulation instructions were given to patient.  Results were reviewed/authorized by Cloyde Reams, RN, BSN.  He was notified by Cloyde Reams, RN, BSN.         Prior Anticoagulation Instructions: INR 2.5  Continue on same dosage 1 tablet daily except 1/2 tablet on Mondays and Fridays.  Recheck in 4 weeks.    Current Anticoagulation Instructions: INR 2.6  Continue on same dosage 1 tablet daily except 1/2 tablet on Mondays and Fridays.  Recheck in 4 weeks.

## 2010-10-28 ENCOUNTER — Encounter: Payer: Self-pay | Admitting: Cardiology

## 2010-10-28 DIAGNOSIS — I4891 Unspecified atrial fibrillation: Secondary | ICD-10-CM

## 2010-11-11 ENCOUNTER — Encounter: Payer: Self-pay | Admitting: Cardiology

## 2010-11-11 ENCOUNTER — Encounter (INDEPENDENT_AMBULATORY_CARE_PROVIDER_SITE_OTHER): Payer: Medicare Other

## 2010-11-11 DIAGNOSIS — I4891 Unspecified atrial fibrillation: Secondary | ICD-10-CM

## 2010-11-11 DIAGNOSIS — Z7901 Long term (current) use of anticoagulants: Secondary | ICD-10-CM

## 2010-11-11 LAB — CONVERTED CEMR LAB: POC INR: 2.5

## 2010-11-19 NOTE — Medication Information (Signed)
Summary: rov/tm  Anticoagulant Therapy  Managed by: Bethena Midget, RN, BSN Referring MD: Valera Castle MD PCP: [facility]  Supervising MD: Antoine Poche MD, Fayrene Fearing Indication 1: Atrial Fibrillation (ICD-427.31) Lab Used: [facility] Site: [facility] INR POC 2.5 INR RANGE 2 - 3  Dietary changes: no    Health status changes: no    Bleeding/hemorrhagic complications: no    Recent/future hospitalizations: no    Any changes in medication regimen? no    Recent/future dental: no  Any missed doses?: no       Is patient compliant with meds? yes       Allergies: 1)  ! Morphine  Anticoagulation Management History:      The patient is taking warfarin and comes in today for a routine follow up visit.  Positive risk factors for bleeding include an age of 75 years or older.  The bleeding index is 'intermediate risk'.  Positive CHADS2 values include History of HTN and Age > 74 years old.  The start date was 11/24/2007.  His last INR was 1.6 RATIO.  Anticoagulation responsible provider: Antoine Poche MD, Fayrene Fearing.  INR POC: 2.5.  Cuvette Lot#: [id number].  Exp: 09/2011.    Anticoagulation Management Assessment/Plan:      The patient's current anticoagulation dose is Warfarin sodium 5 mg  tabs: Take as directed by coumadin clinic..  The target INR is 2 - 3.  The next INR is due 12/09/2010.  Anticoagulation instructions were given to patient.  Results were reviewed/authorized by Bethena Midget, RN, BSN.  He was notified by Bethena Midget, RN, BSN.         Prior Anticoagulation Instructions: INR 2.6  Continue on same dosage 1 tablet daily except 1/2 tablet on Mondays and Fridays.  Recheck in 4 weeks.   Current Anticoagulation Instructions: INR 2.5 Continue 1 pill everyday except 1/2 pill  on Mondays and Fridays. Recheck in 4 weeks.

## 2010-12-09 ENCOUNTER — Ambulatory Visit (INDEPENDENT_AMBULATORY_CARE_PROVIDER_SITE_OTHER): Payer: Medicare Other | Admitting: *Deleted

## 2010-12-09 ENCOUNTER — Encounter: Payer: Self-pay | Admitting: Cardiology

## 2010-12-09 DIAGNOSIS — I4891 Unspecified atrial fibrillation: Secondary | ICD-10-CM

## 2010-12-09 DIAGNOSIS — Z7901 Long term (current) use of anticoagulants: Secondary | ICD-10-CM

## 2010-12-09 HISTORY — DX: Long term (current) use of anticoagulants: Z79.01

## 2010-12-13 ENCOUNTER — Encounter: Payer: Self-pay | Admitting: Cardiology

## 2010-12-13 ENCOUNTER — Ambulatory Visit (INDEPENDENT_AMBULATORY_CARE_PROVIDER_SITE_OTHER): Payer: Medicare Other | Admitting: Cardiology

## 2010-12-13 VITALS — BP 136/62 | HR 57 | Resp 18 | Ht 66.0 in | Wt 186.0 lb

## 2010-12-13 DIAGNOSIS — R609 Edema, unspecified: Secondary | ICD-10-CM

## 2010-12-13 DIAGNOSIS — I2510 Atherosclerotic heart disease of native coronary artery without angina pectoris: Secondary | ICD-10-CM

## 2010-12-13 DIAGNOSIS — I4891 Unspecified atrial fibrillation: Secondary | ICD-10-CM

## 2010-12-13 DIAGNOSIS — I679 Cerebrovascular disease, unspecified: Secondary | ICD-10-CM

## 2010-12-13 DIAGNOSIS — R0602 Shortness of breath: Secondary | ICD-10-CM

## 2010-12-13 MED ORDER — POTASSIUM CHLORIDE CRYS ER 20 MEQ PO TBCR: 20.0000 meq | EXTENDED_RELEASE_TABLET | Freq: Every day | ORAL | Status: DC

## 2010-12-13 MED ORDER — FUROSEMIDE 40 MG PO TABS: 40.0000 mg | ORAL_TABLET | Freq: Every day | ORAL | Status: DC

## 2010-12-13 NOTE — Assessment & Plan Note (Signed)
Worse, take lasix and potassium daily.

## 2010-12-13 NOTE — Progress Notes (Signed)
   Patient ID: [name], male    DOB: 01-Jul-1922, 77 y.o.   MRN: [id number]  HPI  [name] returns for [name] of his multiple cardiac and vascular problems. He still gets around pretty well. He is limited by DOE. This is improved by O2 which his daughter says they have a hard time getting him to wear. I have reinforced this today.  He is bothered by LE edema. He takes lasix and KCL prn. Denies orthopnea.  EKG shows CAF with IRBBB.    Review of Systems  All other systems reviewed and are negative.      Physical Exam  Constitutional: He is oriented to person, place, and time. He appears well-developed and well-nourished.       Elderly, [name]  HENT:  Head: Normocephalic and atraumatic.  Eyes: EOM are normal. Pupils are equal, round, and reactive to light.  Neck: Neck supple. No JVD present. No tracheal deviation present. No thyromegaly present.       BILATERAL BRUITS  Cardiovascular: Normal rate, S1 normal and S2 normal.  An irregularly irregular rhythm present.  Murmur heard.  Systolic murmur is present with a grade of 2/6  Pulmonary/Chest: Effort normal and breath sounds normal. He has no wheezes. He has no rales.  Musculoskeletal:       2 PLUS EDEMA  Neurological: He is alert and oriented to person, place, and time.  Skin: Skin is warm and dry.  Psychiatric: He has a normal mood and affect.

## 2010-12-13 NOTE — Assessment & Plan Note (Signed)
Stable, medical therapy.

## 2010-12-13 NOTE — Assessment & Plan Note (Signed)
Stable. Medical therapy.

## 2010-12-13 NOTE — Patient Instructions (Signed)
1. Your physician recommends that you schedule a follow-up appointment in: 6 months with Dr. Daleen Squibb 2. Your physician has recommended you make the following change in your medication: Take your lasix (furosemide) daily.  Take your potassium daily.

## 2010-12-13 NOTE — Assessment & Plan Note (Signed)
STABLE

## 2010-12-17 ENCOUNTER — Ambulatory Visit (INDEPENDENT_AMBULATORY_CARE_PROVIDER_SITE_OTHER): Payer: Medicare Other | Admitting: Family Medicine

## 2010-12-17 ENCOUNTER — Encounter: Payer: Self-pay | Admitting: Family Medicine

## 2010-12-17 DIAGNOSIS — I10 Essential (primary) hypertension: Secondary | ICD-10-CM

## 2010-12-17 DIAGNOSIS — R5383 Other fatigue: Secondary | ICD-10-CM

## 2010-12-17 DIAGNOSIS — R413 Other amnesia: Secondary | ICD-10-CM

## 2010-12-17 DIAGNOSIS — I4891 Unspecified atrial fibrillation: Secondary | ICD-10-CM

## 2010-12-17 DIAGNOSIS — I2510 Atherosclerotic heart disease of native coronary artery without angina pectoris: Secondary | ICD-10-CM

## 2010-12-17 DIAGNOSIS — D649 Anemia, unspecified: Secondary | ICD-10-CM

## 2010-12-17 DIAGNOSIS — R0602 Shortness of breath: Secondary | ICD-10-CM

## 2010-12-17 LAB — BASIC METABOLIC PANEL
BUN: 28 mg/dL — ABNORMAL HIGH (ref 6–23)
Calcium: 9.3 mg/dL (ref 8.4–10.5)
Creatinine, Ser: 1 mg/dL (ref 0.4–1.5)
GFR: 78.83 mL/min (ref >60.00)

## 2010-12-17 LAB — CBC WITH DIFFERENTIAL/PLATELET
Basophils Absolute: 0 10*3/uL (ref 0.0–0.1)
Basophils Relative: 0.3 % (ref 0.0–3.0)
Eosinophils Absolute: 0.1 10*3/uL (ref 0.0–0.7)
Hemoglobin: 12.4 g/dL — ABNORMAL LOW (ref 13.0–17.0)
MCHC: 33.8 g/dL (ref 30.0–36.0)
MCV: 93.6 fl (ref 78.0–100.0)
Monocytes Absolute: 0.8 10*3/uL (ref 0.1–1.0)
Neutro Abs: 5.8 10*3/uL (ref 1.4–7.7)
Neutrophils Relative %: 60.4 % (ref 43.0–77.0)
RBC: 3.92 Mil/uL — ABNORMAL LOW (ref 4.22–5.81)
RDW: 14.5 % (ref 11.5–14.6)

## 2010-12-17 LAB — LIPID PANEL: Cholesterol: 108 mg/dL (ref 0–200)

## 2010-12-17 LAB — HEPATIC FUNCTION PANEL
ALT: 20 U/L (ref 0–53)
AST: 24 U/L (ref 0–37)
Alkaline Phosphatase: 93 U/L (ref 39–117)
Bilirubin, Direct: 0.2 mg/dL (ref 0.0–0.3)
Total Protein: 6.6 g/dL (ref 6.0–8.3)

## 2010-12-17 NOTE — Progress Notes (Signed)
  Subjective:    Patient ID: [name], male    DOB: 1922/07/10, 77 y.o.   MRN: [id number]  HPI Patient to establish care. Patient has multiple chronic problems including history of hyperlipidemia, obstructive sleep apnea, hypertension, CAD, history of aortic valve replacement and CABG, atrial fibrillation, peripheral vascular disease, asbestosis. Also reported history of obstructive sleep apnea. Uses oxygen at night and occasionally during day. Some recent problems with progressive lower tibial edema. Lung status is about the same as usual from symptomatic standpoint. He takes furosemide 20 mg daily.  Family reports some recent short-term memory loss over the past year. Long-term memory intact. No prior clear screening for dementia. No recent lab work other than he gets monthly INR. Medications are reviewed. Compliant with all.  No recent falls.  No headaches or focal weakness.  No depression symptoms.   Review of Systems  Constitutional: Negative for fever, chills, activity change, appetite change and unexpected weight change.  HENT: Negative for trouble swallowing.   Respiratory: Negative for cough, shortness of breath and wheezing.   Cardiovascular: Positive for leg swelling. Negative for palpitations.  Gastrointestinal: Negative for nausea, vomiting, abdominal pain, diarrhea and constipation.  Neurological: Negative for weakness and headaches.  Hematological: Negative for adenopathy.  Psychiatric/Behavioral: Negative for confusion and dysphoric mood.       Objective:   Physical Exam  Constitutional: He is oriented to person, place, and time. He appears well-developed and well-nourished.  HENT:  Right Ear: External ear normal.  Left Ear: External ear normal.  Mouth/Throat: Oropharynx is clear and moist. No oropharyngeal exudate.  Neck: Neck supple. No thyromegaly present.  Cardiovascular: Normal rate.        Patient has a regular rhythm but rate controlled  Pulmonary/Chest:  Effort normal and breath sounds normal. No respiratory distress. He has no wheezes. He has no rales.  Musculoskeletal:       1+ pitting edema legs bilaterally  Lymphadenopathy:    He has no cervical adenopathy.  Neurological: He is alert and oriented to person, place, and time. No cranial nerve deficit.  Psychiatric: He has a normal mood and affect. His behavior is normal.          Assessment & Plan:  #1 history of CAD. Needs repeat lipids and these will be done today #2 history of chronic atrial fibrillation, rate controlled #3 reported memory loss. Schedule labs including B12 and TSH. Bring back in a few weeks and administer  Mini-Mental status exam. #4 history of asbestosis #5 increased peripheral edema possibly related in part to chronic lung disease. Patient encouraged to continue with regular use of oxygen

## 2010-12-18 NOTE — Progress Notes (Signed)
Quick Note:  Pt informed on home VM ______ 

## 2011-01-06 ENCOUNTER — Ambulatory Visit (INDEPENDENT_AMBULATORY_CARE_PROVIDER_SITE_OTHER): Payer: Medicare Other | Admitting: *Deleted

## 2011-01-06 DIAGNOSIS — I4891 Unspecified atrial fibrillation: Secondary | ICD-10-CM

## 2011-01-06 LAB — POCT INR: INR: 2.2

## 2011-01-14 ENCOUNTER — Encounter: Payer: Self-pay | Admitting: Family Medicine

## 2011-01-14 ENCOUNTER — Ambulatory Visit (INDEPENDENT_AMBULATORY_CARE_PROVIDER_SITE_OTHER): Payer: Medicare Other | Admitting: Family Medicine

## 2011-01-14 DIAGNOSIS — E785 Hyperlipidemia, unspecified: Secondary | ICD-10-CM

## 2011-01-14 DIAGNOSIS — I10 Essential (primary) hypertension: Secondary | ICD-10-CM

## 2011-01-14 DIAGNOSIS — R609 Edema, unspecified: Secondary | ICD-10-CM

## 2011-01-14 MED ORDER — AMLODIPINE BESYLATE 5 MG PO TABS: 5.0000 mg | ORAL_TABLET | Freq: Every day | ORAL | Status: DC

## 2011-01-14 NOTE — Assessment & Plan Note (Signed)
[facility]                            CARDIOLOGY OFFICE NOTE   CANYON, LOHR                         MRN:          [id number]  DATE:09/24/2007                            DOB:          09-29-1921    Ms. [name] returns today for further management of his multiple  cardiovascular and vascular issues.   PROBLEM LIST:  1. Coronary artery disease.  He is status post coronary bypass      grafting in 2005.  His last stress Myoview was in May 2007, EF 60%      no ischemia.  2. History of severe aortic stenosis, status post aortic valve      prosthetic replacement.  Last 2-D echocardiogram in August 2008,      was stable with no significant stenosis.  3. Cerebrovascular disease.  He is asymptomatic at present.  Last      carotid Dopplers were April 2008, which showed stable disease 40-      59%, right internal carotid artery stenosis, 60-79% left internal      carotid artery stenosis.  He has antegrade flow in both vertebrals.  4. Peripheral vascular disease.  We checked peripheral Dopplers on      April 07, 2007.  He had an ABI of 0.7 on the right, 0.54 on the      left.  He is having some pain consistent with neuropathy, but no      claudication.  5. Hypertension.  6. Hyperlipidemia.  His lipids were at goal on June 02, 2007, on      pravastatin and Zetia.  7. Sleep apnea, now more compliance with CPAP.   His wife says he sleeps all the time during the day.  He states that he  sleeps well at night.  They sleep and different rooms.  He does state he  is compliant with CPAP.   MEDICATIONS:  1. Metoprolol succinate 25 mg a day.  2. Lisinopril 40 mg a day.  3. Zetia 10 mg a day.  4. Pravastatin 40 mg a day.  5. Aspirin 81 mg a day.  6. Mega Men vitamin.  7. Nexium 40 mg a day.  8. CPAP.  9. Amlodipine 10 mg a day.   PHYSICAL EXAMINATION:  VITAL SIGNS:  Blood pressure 145/69, his pulse 65  and regular.  Weight is 209, up six.  HEENT:   Normocephalic, atraumatic.  PERRL.  Extraocular movements  intact.  Sclerae are clear.  Facial asymmetry is normal.  NECK:  Supple.  Carotid upstrokes were equal bilaterally with bilateral  bruits, right greater than left.  Thyroid is not enlarged.  Trachea is  midline.  There is no obvious JVD.  LUNGS:  Clear to auscultation posteriorly.  HEART:  A nondisplaced PMI.  Normal S1, S2 with a soft systolic murmur  along the left sternal border.  ABDOMEN:  Protuberant, good bowel sounds.  No obvious tenderness or  pulsatile mass.  EXTREMITIES:  Good capillary refill, but absent dorsalis pedis and  posterior tibial pulses.  He has no sign of DVT.  There  is a few venous  varicosities.  NEUROLOGIC:  Intact.   EKG demonstrates sinus rhythm with first-degree AV block with poor R-  wave progression across the anterior precordium.  This is unchanged.   ASSESSMENT/PLAN:  [name] is a very complex patient who is stable.  I  have asked him to stay on the current medications.  Will follow up  closely with him in the fall of 2009.  At that time, he will need a  followup 2-D echocardiogram, a stress Myoview as well as carotids and  peripheral vascular exam.     [name]. Daleen Squibb, MD, [facility]  Electronically Signed    TCW/MedQ  DD: 09/24/2007  DT: 09/24/2007  Job #: [id number]   cc:   Patrica Duel, M.D.

## 2011-01-14 NOTE — Assessment & Plan Note (Signed)
[facility]                       [place] CARDIOLOGY OFFICE NOTE   [name], [name]                         MRN:          [id number]  DATE:08/21/2008                            DOB:          06/10/1922    [name] comes in today for followup.   Since his last visit, I have talked to [name] his  ophthalmologist.  He says there is an increased risk of bleeding on  Coumadin less so with aspirin.  He said the Coumadin was not absolutely  contraindicated.   Fortunately, [name] has only been in atrial fib one time back in the  summer.  He was very symptomatic at that time.  He checks his pulse  daily and notes when it is regular.   PHYSICAL EXAMINATION:  VITAL SIGNS:  His blood pressure today is 134/64,  his pulse is 70 and regular.  Weight is 198.  GENERAL:  He is in no acute distress.  Exam is unchanged except I hear a  left carotid bruit today.  He does have a history of nonobstructive  carotid disease.   ASSESSMENT AND PLAN:  [name] is doing well.  I have asked him to  continue his current medical program.  He will remain off of Coumadin or  warfarin.  We will obtain carotid Dopplers.  I will see him back again  in 3 months.     Thomas C. Daleen Squibb, MD, [facility]  Electronically Signed    TCW/MedQ  DD: 08/21/2008  DT: 08/22/2008  Job #: [id number]   cc:   Patrica Duel, M.D.

## 2011-01-14 NOTE — Assessment & Plan Note (Signed)
[facility]                       [place] CARDIOLOGY OFFICE NOTE   CYPRIAN, GONGAWARE                         MRN:          [id number]  DATE:05/23/2009                            DOB:          1922/01/25    Leitha Schuller was seen today as an add-on because of his wife being  concerned about increased shortness of breath.   He has increased dyspnea on exertion.  He denies any orthopnea, PND, or  peripheral edema.   He was recently evaluated in the pulmonary office at [facility] in  [place].  He dropped his O2 sats from 94% down to about 87% with  walking.  His symptoms were reproduced.  O2 was ordered to wear with  exertion.  He just obtained it yesterday.   He says he does have a little bit of chest tightness when he exerts  himself.  I suspect he is having some strain on his heart from the  exertional hypoxemia.   His cardiac problem list is extensive and includes,  1. Coronary artery disease status post coronary artery bypass      grafting.  2. History of aortic valve replacement for severe stenosis.  3. History of nonobstructive carotid artery disease.  4. Hypertension.  5. Chronic atrial fibrillation, failed cardioversion.  6. Anticoagulation.  7. Hyperlipidemia.  8. Obstructive sleep apnea on continuous positive airway pressure.  9. Chronic obstructive pulmonary disease secondary to obstructive lung      disease and asbestosis.   MEDICATIONS:  1. Metoprolol succinate 50 mg once a day.  2. Lisinopril 40 mg a day.  3. Pravastatin 40 mg a day.  4. Aspirin 81 mg a day.  5. Multivitamin daily.  6. Amlodipine 10 mg a day.  7. CPAP at bedtime.  8. Potassium 20 mEq as needed.  9. Furosemide 40 mg as needed.  10.Warfarin and Nexium 40 mg a day.   PHYSICAL EXAMINATION:  VITAL SIGNS:  Today his blood pressure is 121/64,  pulse is 80 and irregular, his weight is 186 which is down 8 pounds  since I last saw him.  His O2 sat on room air is  93%.  GENERAL:  He is in no acute distress.  HEENT:  Atraumatic.  Sclerae are slightly injected.  NECK:  Supple.  Carotid upstrokes were equal bilaterally with bilateral  bruits.  There is no JVD.  Thyroid is not enlarged.  LUNGS:  No rales or rhonchi or wheezes.  HEART:  A systolic murmur consistent with aortic stenosis.  He has S2  splits.  ABDOMEN:  Soft.  Good bowel sounds.  EXTREMITIES:  No cyanosis, clubbing, or edema.  Pulses are intact.   ASSESSMENT:  Exertional dyspnea with some tightness in his chest  secondary to exertional hypoxemia from his chronic lung disease.   RECOMMENDATIONS:  1. Wear O2 at all times with exertion is outlined by pulmonary.  2. Continue current medications.  3. If he continues to have chest tightness despite wearing O2, we can      reassess him.   He had a recent ischemic evaluation as well as echocardiogram  which were  stable.  The ischemic evaluation was last June 2009.   I will plan on seeing him back again in 3 months.     Thomas C. Daleen Squibb, MD, [facility]  Electronically Signed    TCW/MedQ  DD: 05/23/2009  DT: 05/24/2009  Job #: [id number]   cc:   Patrica Duel, M.D.

## 2011-01-14 NOTE — Assessment & Plan Note (Signed)
[facility]ALTH[facility]                       [place] CARDIOLOGY OFFICE NOTE   VISHAL, SANDLIN                         MRN:          [id number]  DATE:08/03/2008                            DOB:          1922-06-08    Mr. Patriarca comes in today because of some new bleeding behind his right  eye.   He went to see Dr. Fawn Kirk who treated his left eye several years  ago for some leaking blood in his eye.  He found evidence of new  bleeding in the right eye.  Unfortunately, Mr. Malek has gotten to  where he cannot even read the newspaper without a magnifying glass.   He is on Coumadin as well as aspirin for his vascular disease as well as  his history of paroxysmal atrial fibrillation which he had in March with  pneumonia.  Since his cardioversion, he has not had recurrent atrial fib  and has not been on any specific antiarrhythmic.   For his complete problem list, please see my office note from February 01, 2008.   His current meds are:  1. Metoprolol succinate 50 mg daily.  2. Lisinopril 40 mg daily.  3. Pravastatin 40 mg a day.  4. Aspirin 81 mg a day.  5. Multivitamin daily.  6. Nexium 40 mg a day.  7. Amlodipine 10 mg a day.  8. Warfarin as directed.  9. CPAP for his obstructive sleep apnea.   PHYSICAL EXAMINATION:  VITAL SIGNS:  His blood pressure today is 118/68,  his pulse 72 and regular.  Weight is 198.  HEENT:  He wears glasses.  No gross abnormalities.  NECK:  Soft right carotid bruit.  Thyroid is not enlarged.  Trachea is  midline.  LUNGS:  Clear to auscultation and percussion.  HEART:  Normal S1 and S2.  No diastolic component.  S2 splits  physiologically.  ABDOMEN:  Soft.  Good bowel sounds.  No midline bruits.  EXTREMITIES:  Pulses to be diminished but present.  There is no edema.  There is no sign of DVT.   I have put a call to Dr. Fawn Kirk who is in the middle of a  procedure.  He will call me back.  At this point in time,  however, I am  going to stop his Coumadin.  He has been in sinus rhythm for greater  than 6 months and has not had a recurrence.  The atrial fib also was in  the setting of  pneumonia.  Hopefully, it will not recur.  He will stay on aspirin for  the time being at 81 mg a day unless Dr. Luciana Axe tells me differently.  I  will plan on seeing him back the week of Christmas.     Thomas C. Daleen Squibb, MD, [facility]  Electronically Signed    TCW/MedQ  DD: 08/03/2008  DT: 08/04/2008  Job #: [id number]   cc:   Patrica Duel, M.D.  Alford Highland. Rankin, M.D.

## 2011-01-14 NOTE — Progress Notes (Signed)
Subjective:    Patient ID: [name], male    DOB: 11/13/1921, 77 y.o.   MRN: [id number]  HPI Patient recently seen to establish care. History of hyperlipidemia, obstructive sleep apnea, hypertension, CAD, aortic stenosis, atrial fibrillation, cerebrovascular disease, COPD, asbestosis. Recent increased edema. Initiated furosemide 40 mg daily with slight improvement. Still has some increased ankle edema. Takes amlodipine 10 mg daily. Blood pressure has declined since initiation of furosemide but no significant orthostatic symptoms. Blood pressure low 100s systolic.  The patient maintained on Coumadin followed through Coumadin clinic. No recent bleeding complications. Wife thinks he is generally more fatigued and lethargic recently. Had multiple labs drawn including thyroid function, electrolytes, and B12 which were all normal. Some decline in short-term memory. Gen. he seems irritable but no significant depressive symptoms. No agitation.  Past Medical History  Diagnosis Date  . Encounter for long-term (current) use of anticoagulants 12/09/2010  . Hypoxemia 05/15/2009  . Atrial fibrillation 01/19/2009  . CORONARY ARTERY DISEASE 10/07/2007  . AORTIC STENOSIS, SEVERE 10/07/2007  . PVD 10/07/2007  . HYPERLIPIDEMIA 10/07/2007  . HYPERTENSION 10/07/2007  . CEREBROVASCULAR DISEASE 10/07/2007  . DYSPNEA 02/14/2008  . OBSTRUCTIVE SLEEP APNEA 10/07/2007  . Asbestosis 02/21/2008  . History of colon cancer   . DDD (degenerative disc disease)     low back  . Colon polyp    Past Surgical History  Procedure Date  . Abdominal aortic aneurysm repair 1992  . Coronary artery bypass graft 2005  . Aortic valve replacement 2005  . Knee surgery     left    reports that he quit smoking about 37 years ago. His smoking use included Cigarettes. He smoked 1 pack per day for 0 years. He does not have any smokeless tobacco history on file. He reports that he does not drink alcohol or use illicit drugs. family history includes  COPD in an unspecified family member; Colon cancer in an unspecified family member; Coronary artery disease in an unspecified family member; Heart disease in his father and unspecified family member; Lung cancer in an unspecified family member; and Stroke (age of onset:61) in his mother. Allergies  Allergen Reactions  . Morphine     REACTION: unk     Review of Systems  Constitutional: Positive for fatigue. Negative for fever, chills and unexpected weight change.  Respiratory: Negative for cough, shortness of breath and wheezing.   Cardiovascular: Positive for leg swelling. Negative for chest pain and palpitations.  Gastrointestinal: Negative for nausea, vomiting and abdominal pain.  Genitourinary: Negative for dysuria.  Neurological: Negative for headaches.  Psychiatric/Behavioral: Negative for agitation.       Objective:   Physical Exam  Constitutional: He is oriented to person, place, and time. He appears well-developed and well-nourished.  HENT:  Head: Normocephalic and atraumatic.  Right Ear: External ear normal.  Left Ear: External ear normal.  Mouth/Throat: Oropharynx is clear and moist.  Neck: Neck supple.  Cardiovascular: Normal rate and regular rhythm.   Pulmonary/Chest: Effort normal and breath sounds normal. No respiratory distress. He has no wheezes. He has no rales.  Musculoskeletal: He exhibits edema.       Patient has 1+ pitting edema lower legs bilaterally  Neurological: He is alert and oriented to person, place, and time. No cranial nerve deficit.  Psychiatric: He has a normal mood and affect.          Assessment & Plan:  #1 peripheral edema. Likely partly related to amlodipine. Reduce amlodipine 5 mg daily. May  be a reduce furosemide dosage as similar to pain decreased #2 hypertension stable. Reduce amlodipine as above. #3 concern for short-term memory loss. Recent B12 and TSH normal. Discussed medications such as Namenda or Aricept and we decided not to  initiate this time #4 hyperlipidemia. Recent lipids reviewed with patient at goal

## 2011-01-14 NOTE — Assessment & Plan Note (Signed)
[facility]                            CARDIOLOGY OFFICE NOTE   [name], [name]                         MRN:          [id number]  DATE:12/13/2007                            DOB:          [birth year]    [name] returns today after being discharged from the hospital.  He  was in atrial fibrillation with a rapid ventricular response.   He underwent DC cardioversion by transesophageal echocardiography.  He  was anticoagulated and discharged home.  He is for protime today.   He has had no symptoms of recurrent atrial fibrillation.  He denies  orthopnea, PND or peripheral edema.  He is concerned about the co-pay  for his Coumadin checks.  I told him conventional lab work may be about  the same price.   MEDICATIONS:  1. Metoprolol succinate 25 mg daily.  2. Lisinopril 40 mg a day.  3. Pravastatin 40 mg a day.  4. Aspirin 81 mg a day.  5. Nexium 40 mg a day.  6. Amlodipine 10 mg a day.  7. Potassium 20 mEq a day.  8. Lasix 40 mg a day.  9. Coumadin as directed.   PHYSICAL EXAMINATION:  VITAL SIGNS:  His blood pressure is 128/61, his  pulse of 79-90 and regular.  His weight is 197 which is down 12 since  January.  HEENT:  Unchanged.  NECK:  Carotids are full.  He has bilateral soft systolic sounds.  Thyroid is not enlarged.  Trachea is midline.  LUNGS:  Clear.  HEART:  A regular rate and rhythm.  He has a systolic murmur along the  left sternal border.  No diastolic component was appreciated.  ABDOMEN:  Soft, good bowel sounds.  He has obviously lost some weight.  There are no obvious abnormalities.  Bowel sounds present.  EXTREMITIES:  No cyanosis, clubbing or edema.  Pulses are intact.  He  has a few venous varicosities.  NEURO:  Exam is intact.   EKG shows normal sinus rhythm with a first-degree AV block with an RSR  prime in V1.   I am pleased with how [name] is doing.  We are going to increase his  metoprolol to 50 mg daily.  We  will check a protime today.  I will plan  on seeing him back again in three months.     Thomas C. Daleen Squibb, MD, [facility]  Electronically Signed    TCW/MedQ  DD: 12/13/2007  DT: 12/13/2007  Job #: [id number]   cc:   Patrica Duel, M.D.

## 2011-01-14 NOTE — Assessment & Plan Note (Signed)
[place] [facility]                            CARDIOLOGY OFFICE NOTE   OSLO, HUNTSMAN                         MRN:          [id number]  DATE:03/09/2008                            DOB:          September 27, 1921    HISTORY OF PRESENT ILLNESS:  Mr. Seder returns today for increased  shortness of breath and coronary artery disease.  Please see my last  office note.  We performed a stress Myoview to rule out coronary  ischemia.  It showed an EF of 61% with dyssynergia of the septum  consistent with previous surgery.  There was no ischemia or scar.   I sent him to Dr. Shan Levans, who felt like this was a combination  of pulmonary asbestosis and restrictive lung disease.  He also has sleep  apnea which is under good control.   Koltin said he feels a little bit better, but still is plagued with some  shortness of breath.  I think a lot of this is just multifactorial.  Please refer to my note on February 01, 2008, for complete problem list.  He  denies any tachypalpitations, presyncope, or syncope.   MEDICATIONS:  His meds were unchanged except for changes that Dr. Delford Field  made with Nasacort and Astelin.   PHYSICAL EXAMINATION:  GENERAL:  He looks remarkably good today.  His blood pressure is 106/63.  His pulse is 72 and regular.  His weight  is 193 down 2.  NECK:  No JVD.  Carotids are full.  LUNGS:  Clear without inspiratory or expiratory rhonchi.  HEART:  Regular rate and rhythm.  No gallops.  His pulse is clearly  regular today.  ABDOMEN:  Protuberant.  Good bowel sounds.  EXTREMITIES:  No edema.  Pulses are intact.   I am delighted that Mr. Pichardo's cardiac status is stable and he seems  to be remaining in sinus rhythm.  I have made no changes in his medical  program.  I plan on seeing him back in 6 months.     Thomas C. Daleen Squibb, MD, [facility]  Electronically Signed    TCW/MedQ  DD: 03/09/2008  DT: 03/10/2008  Job #: [id number]   cc:   Patrica Duel, M.D.

## 2011-01-14 NOTE — Assessment & Plan Note (Signed)
[facility]                            CARDIOLOGY OFFICE NOTE   ESSEX, PERRY                         MRN:          [id number]  DATE:10/25/2008                            DOB:          06-13-1922    Mr. [name] comes in today for followup.  He remains in atrial fib, but  is relatively asymptomatic.  He does have some dyspnea on exertion.  His  rates are well controlled in the 60s.  He is tolerating Coumadin well  and had a protime checked yesterday, it was therapeutic.   His carotid Dopplers in [place], January 5, showed nonobstructive  plaque in both carotids.  He has got antegrade flow in both vertebrals.   He is currently having no cardiovascular complaints such as chest pain,  tachy palpitations, orthopnea, or PND.  He is still able to work out in  the yard, though he gets a little short of breath.   His meds are outlined in the chart and I have not changed since last  visit except he is now on Coumadin.   PHYSICAL EXAMINATION:  VITAL SIGNS:  His blood pressure today is 134/60,  his pulse is 62 and irregular, EKG confirms, weight is stable at 197.  HEENT:  Normal.  NECK:  Carotid upstrokes were equal bilaterally without bruits.  Thyroid  is not enlarged.  Trachea is midline.  LUNGS:  Clear to auscultation and percussion.  HEART:  Irregular rate and rhythm.  No gallop.  ABDOMEN:  Soft, good bowel sounds.  EXTREMITIES:  No significant edema.  Pulses are intact.  NEUROLOGIC:  Intact.   ASSESSMENT AND PLAN:  Mr. Tousley is doing well and chronic atrial fib at  this point with a well-controlled ventricular rate.  He is on Coumadin.  He has had a recent ischemic evaluation which was stable.  He has had a  recent aortic valve and carotid evaluation which is stable.   I will plan on seeing him back again in 6 months.     Thomas C. Daleen Squibb, MD, [facility]  Electronically Signed    TCW/MedQ  DD: 10/25/2008  DT: 10/26/2008  Job #: [id number]   cc:    Patrica Duel, M.D.

## 2011-01-14 NOTE — Op Note (Signed)
NAMEJHAIR, WITHERINGTON                  ACCOUNT NO.:  [id number]   MEDICAL RECORD NO.:  [id number]          PATIENT TYPE:  EMS   LOCATION:  ED                            FACILITY:  [facility]   PHYSICIAN:  Kassie Mends, M.D.      DATE OF BIRTH:  11/27/1921   DATE OF PROCEDURE:  12/11/2007  DATE OF DISCHARGE:                               OPERATIVE REPORT   PROCEDURE:  Esophagogastroduodenoscopy with removal of impacted food  bolus.   INDICATION FOR EXAM:  Mr. Episcopo is an 77 year old male who was at [employer] this morning and tried the honey-baked [place].  He has had  food stuck in his esophagus since around 9 a.m.   FINDINGS:  1. A large food bolus in the distal esophagus, removed piecemeal using      the [employer].  Minimal trauma.  2. Distal esophageal ring narrowing the esophagus to approximately 11-      12 mm.  The diagnostic gastroscope passed without resistance.  3. Normal pylorus with evidence of prior peptic ulcer disease.  No      erosions, ulcerations or erythema seen.  4. Normal duodenal bulb and second portion of the duodenum.  Moderate      bile staining.   DIAGNOSIS:  Impacted food bolus secondary to distal fibrous ring in the  esophagus.   RECOMMENDATIONS:  1. May continue aspirin and Coumadin until the decision is made when      to his perform esophageal dilation.  2. Recommended follow up with Dr. Cira Servant within the next 3-4 weeks to      discuss the timing of holding the aspirin and Coumadin for      subsequent esophageal dilation.  He also has an appointment to see      Dr. Daleen Squibb on Wednesday.  3. Continue Nexium.  4. He should follow a soft mechanical diet.  He is instructed to only      eat ground or chopped meat, and avoid raw or dried fruits and      vegetables.  He was given a handout on a soft mechanical diet.  The      diet recommendations were discussed with his family.   MEDICATIONS:  1. Demerol 50 mg IV.  2. Versed 3 mg IV.   PROCEDURE TECHNIQUE:   Physical exam was performed.  Informed consent was  obtained from the patient, explaining the benefits, risks and  alternatives to the procedure.  The patient was connected to the monitor  and placed in the left lateral position.  Continuous oxygen was provided  by nasal cannula.  IV medicine administered through an indwelling  cannula.  After administration of sedation, the patient's esophagus was  intubated and the scope was advanced under direct visualization to the  distal esophagus, where an impacted food bolus was identified.  The food  bolus was removed piecemeal and took 3 passes to reduce the size of the  food bolus, which was then advanced into the stomach.  The scope was  then advanced under direct visualization to the second  portion of the  duodenum.  The scope was removed slowly by carefully examining the  color, texture, anatomy and integrity of the mucosa on the way out.  The  patient was recovered in endoscopy and discharged home in satisfactory  condition.   ADDENDUM:  16109: Spoke with Dr. Daleen Squibb. Recommended continuing Coumadin  for 6 weeks after cardioversion and then may stop ASA/Coumadin for the  procedure. Will schedule EGD/dilation after May 10. Spoke with patients  wife. Pt not available. Pt may  call with questions, [phone].      Kassie Mends, M.D.  Electronically Signed     SM/MEDQ  D:  12/11/2007  T:  12/11/2007  Job:  [id number]   cc:   Rhae Lerner. Margretta Ditty, M.D.  [address]  [place]  [place] [address]   Jesse Sans. Wall, MD, FACC  [address]  [address]  [place]  [place] [address]   Kirk Ruths, M.D.  Fax: [phone]

## 2011-01-14 NOTE — Assessment & Plan Note (Signed)
[facility]                            CARDIOLOGY OFFICE NOTE   MONTY, SPICHER                         MRN:          [id number]  DATE:02/01/2008                            DOB:          12-22-1921    Mr. Halleck comes in today because of increased dyspnea on exertion.  Our  concern was that he may be back in atrial fibrillation with a rapid  ventricular spots.  It has been going on for several weeks.  He denies  any orthopnea or PND.  He wears a CPAP at night for his obstructive  sleep apnea.  He says this helps.  It mainly bothers him when he tries  to do any outside work such as weed eating.   PROBLEM LIST:  1. Coronary artery disease, status coronary artery bypass grafting in      2005.  His last stress Myoview was May 2007.  Ejection fraction of      60%, no ischemia.  2. History of severe aortic stenosis, status post aortic valve      prosthetic replacement.  His last 2-D echo August 2008 was stable      with no significant stenosis.  3. Cerebrovascular disease which is currently asymptomatic.  4. Peripheral vascular disease as previously noted.  5. Hypertension.  6. Hyperlipidemia.  7. Sleep apnea.   As mentioned above, he was in paroxysmal atrial fibrillation with rapid  ventricular rate.  He was cardioverted back to sinus rhythm during his  last hospitalization.   His history is also significant for chronic obstructive lung disease  with asbestosis exposure.  He has seen Dr. Shan Levans in the past.   MEDICATIONS:  He is currently on  1. Lisinopril 40 mg a day.  2. Pravastatin 40 mg a day.  3. Aspirin 81 mg a day.  4. Nexium 40 mg a day.  5. CPAP.  6. Amlodipine 10 mg a day.  7. Potassium 20 mEq a day.  8. Lasix 40 mg a day.  9. Coumadin as directed.  10.Metoprolol succinate 50 mg a day.   PHYSICAL EXAMINATION:  GENERAL APPEARANCE:  He is in no acute distress.  SKIN:  Warm and dry, unremarkable.  VITAL SIGNS:  His blood  pressure is 108/56, pulse 78 and regular.  His  weight is 195, which is down 2.  HEENT:  Normocephalic, atraumatic.  PERRLA.  Extraocular movements  intact.  Sclerae are clear.  Facial symmetry is normal.  NECK:  Supple.  Carotid upstrokes are equal bilaterally with soft  bruits.  Thyroid is not enlarged.  Trachea midline.  LUNGS:  Clear to auscultation and percussion except for a few dry  crackles at the bases.  CARDIAC:  His heart reveals a systolic murmur with a normal W0-J8, split  S2.  There is no gallop.  ABDOMEN:  Soft, good bowel sounds.  No midline bruit.  No hepatomegaly.  EXTREMITIES:  No cyanosis, clubbing or edema.  Pulses are intact.  NEURO:  Exam is intact.   His electrocardiogram shows sinus rhythm with a first-degree AV block.  He has an incomplete right bundle which is old.   ASSESSMENT/PLAN:  Mr. Holsopple has increased dyspnea on exertion.  This  could be an anginal equivalent.  He is not back in atrial fibrillation.  fortunately.  It also be pulmonary related.   PLAN:  1. Continue current medications.  2. Adenosine rest stress Myoview.  3. Arrange follow-up with Dr. Danise Mina for further input.     Thomas C. Daleen Squibb, MD, [facility]  Electronically Signed    TCW/MedQ  DD: 02/01/2008  DT: 02/01/2008  Job #: [id number]   cc:   Patrica Duel, M.D.

## 2011-01-14 NOTE — Discharge Summary (Signed)
NAMELAMICHAEL, YOUKHANA                  ACCOUNT NO.:  [id number]   MEDICAL RECORD NO.:  [id number]          PATIENT TYPE:  INP   LOCATION:  4734                         FACILITY:  [facility]   PHYSICIAN:  Doylene Canning. Ladona Ridgel, MD    DATE OF BIRTH:  Aug 01, 1922   DATE OF ADMISSION:  11/23/2007  DATE OF DISCHARGE:  11/27/2007                               DISCHARGE SUMMARY   DISCHARGING DIAGNOSES:  1. Atrial fibrillation with rapid ventricular response status post      direct current cardioversion on this admission, maintained normal      sinus rhythm.  2. Hypertension.  3. Anticoagulation therapy initiated during hospitalization.  The      patient to follow up with Coumadin clinic this week for further      management.  PT and INR at the time of discharge 30 and 2.7.  The      patient is being discharged home on 5 mg daily.   PAST MEDICAL HISTORY:  1. Hypertension.  2. Dyslipidemia.  3. Obstructive sleep apnea with compliance to CPAP.  4. Peripheral vascular disease, see H&P for details of this.  5. Peripheral neuropathy.  6. History of atrial fib status post Cox-Maze procedure in 2005 at the      time of bypass.  7. Coronary artery disease status post CABG in 2005.  8. Status post stress Myoview in 2007, EF 60%, no ischemia.  9. Aortic stenosis status post aortic valve prosthetic replacement at      the time of bypass in 2005.  10.Cerebrovascular disease.  11.Hyperlipidemia.  12.Obesity.   HISTORY OF PRESENT ILLNESS:  Mr. Collingsworth is a very pleasant 77 year old  Caucasian gentleman with history as stated above underwent a Cox-Maze  procedure at the time of bypass in 2005.  Presented on this admission  complaining of shortness of breath and nausea for 1 week, and discomfort  between his shoulder blades.  Presented to [facility] ER, found to be in  atrial fib.  The patient anticoagulated with heparin, remained n.p.o.,  underwent TE with cardioversion, successful conversion to sinus rhythm.  The patient anticoagulated with Coumadin therapeutic dose, being  discharged home with 5 mg daily.  Follow up in the Coumadin clinic this  week.   He is to continue his previous medications including amlodipine 10,  metoprolol 25, aspirin 81, and pravastatin 40.  His Zetia has been  discontinued.  He is to continue furosemide 40 mg daily and potassium 20  mEq daily which was started during this hospitalization, so possibly  this could be discontinued outpatient when the patient follows up with  Dr. Daleen Squibb.  However, the patient had some lower extremity edema noted on  admission.  The patient to follow up with Dr. Daleen Squibb in 2 weeks.  Office  will call the patient at home to verify date and time.  Follow up in the  Coumadin clinic this week.  Appropriate information has been faxed to  the Coumadin clinic, attention Dr. Shelby Dubin.   At the time of discharge, the patient is afebrile.  Vital signs stable.  INR as stated above.  Duration of discharge encounter over 30 minutes.      Dorian Pod, ACNP      Doylene Canning. Ladona Ridgel, MD  Electronically Signed    MB/MEDQ  D:  11/27/2007  T:  11/27/2007  Job:  [id number]   cc:   Patrica Duel, M.D.

## 2011-01-14 NOTE — Assessment & Plan Note (Signed)
[facility]                            CARDIOLOGY OFFICE NOTE   CHANEY, MACLAREN                         MRN:          [id number]  DATE:03/26/2007                            DOB:          [birth year]    Mr. [name] comes in for further management of his multiple  cardiovascular and vascular issues.   1. Coronary artery disease status post coronary bypass grafting March      2005.  He has normal left ventricular systolic function.  Last      stress Myoview Jan 06, 2006, EF 60%, no significant ischemia.  2. History of severe aortic stenosis, status post aortic valve      bioprosthetic replacement.  Last 2D echo 2006, stable, with no      significant stenosis.  3. Cerebrovascular disease.  He is asymptomatic at present.  Last      carotid Doppler December 29, 2006, stable, antegrade flow in both      vertebrals.  4. Peripheral vascular disease.  He is status post aorta bi-fem      bypass.  He was followed by Dr. Samule Ohm who has left.  He has a      longstanding superficial femoral artery disease bilaterally.  He      has a focal lesion in his left superficial femoral artery.  His      last ABIs were 0.68 on the right, 0.52 on the left.  He is due      followup for this.  He is having some claudication but he also had      peripheral neuropathy symptoms as well.  5. Hypertension.  6. Hyperlipidemia.  Lipids were at goal last year except for a low HDL      which is chronic.  He has questions today about Zetia which I have      answered.  He is on pravastatin and Zetia with good results.  7. Sleep apnea which he now is compliant with his CPAP and it is      helping him a lot, the energy level is better.   Even though he complains of his legs hurting him he is very active in  his garden.  He canned 25+ quarts of beans yesterday.   He looks good,.   His medications:  1. Metoprolol succinate 25 mg a day.  2. Lisinopril 40 mg a day.  3. Zetia 10 mg a day.  4. Pravastatin 40 mg a day.  5. Aspirin 81 mg a day.  6. Mega Man vitamins.  7. Nexium 40 mg a day.  8. CPAP.  9. Amlodipine 10 mg a day.   EXAMINATION:  He is in no acute distress.  His blood pressure is 122/60,  his pulse 68 and regular.  He looks younger than his stated age, his  weight is 11.  SKIN:  Good.  HEENT:  Normocephalic/atraumatic, PERRLA, extraocular movements intact,  sclerae are clear, facial symmetry is normal.  His carotids reveal  bilateral bruits.  There is no thyromegaly, no JVD.  LUNGS:  Clear to auscultation and  percussion.  HEART:  Reveals a soft, systolic murmur, normal S1-S2 splits  physiologically, there is no gallop.  ABDOMINAL:  Soft, good bowel sounds, no midline bruit, no hepatomegaly.  EXTREMITIES:  Reveal no pulse is palpable on either foot.  He has  chronic varicosities and no sign of DVT.  There is no sign of tissue  loss or poor healing.  His toes were cool and his toes were only  slightly cool.  NEURO:  Intact.  SKIN:  Shows a few ecchymoses.   EKG shows sinus rhythm with first degree AV block and a right bundle  which is stable.   ASSESSMENT/PLAN:  Mr. [name] is stable considering the number of cardiac  and vascular issues that he has.  Dr. Samule Ohm has implicated medical  therapy for his peripheral vascular disease.   PLAN:  1. Echocardiogram.  2. Peripheral arterial Dopplers.  3. On the day of clinic visit will obtain fasting lipids and a      comprehensive metabolic panel.     Joshua C. Daleen Squibb, MD, [facility]  Electronically Signed    TCW/MedQ  DD: 03/26/2007  DT: 03/27/2007  Job #: [id number]   cc:   Patrica Duel, M.D.

## 2011-01-14 NOTE — Consult Note (Signed)
NAMEJOAQUIN, KNEBEL                  ACCOUNT NO.:  [id number]   MEDICAL RECORD NO.:  [id number]          PATIENT TYPE:  EMS   LOCATION:  ED                            FACILITY:  [facility]   PHYSICIAN:  Kassie Mends, M.D.      DATE OF BIRTH:  09-20-1921   DATE OF CONSULTATION:  12/11/2007  DATE OF DISCHARGE:                                 CONSULTATION   PRIMARY CARE PHYSICIAN:  Karleen Hampshire, M.D.   PRIMARY CARDIOLOGIST:  Valera Castle, MD.   REFERRING PHYSICIAN:  Rhae Lerner. Margretta Ditty, M.D.   REASON FOR CONSULTATION:  [place] in the esophagus.   HISTORY OF PRESENT ILLNESS:  Mr. Hippler is an 77 year old male who has a  history of esophageal dilation many years ago by Dr. Victorino Dike.  He  has had difficulty swallowing off and on.  He was recently discharged  from the inpatient hospital when he was cardioverted and started on  Coumadin.  He has been on Coumadin for approximately 2 weeks.  He was at  [facility] this morning and tried some honey-baked [place] and ever  since then has been unable to swallow, even his saliva.  He denies any  chest pain, shortness of breath, paroxysmal nocturnal dyspnea, diarrhea  or constipation.  His heartburn is controlled with Nexium.  He says a  cardiologist had no problems performing his transesophageal  echocardiogram.  He has a cow heart valve.  He had a stent placed many  years ago.   PAST MEDICAL HISTORY:  1. Hypertension.  2. Hyperlipidemia.  3. Obstructive sleep apnea  4. Peripheral vascular disease.  5. Coronary artery disease.  6. Aortic stenosis requiring aortic valve replacement.  7. Carotid artery stenosis.   PAST SURGICAL HISTORY:  1. Aorto-femoral bypass.  2. Coronary artery bypass graft.   MEDICATIONS:  1. Metoprolol.  2. Lisinopril.  3. Nexium.  4. Aspirin.  5. Amlodipine.  6. Coumadin.  7. Lasix.  8. Potassium chloride.   FAMILY HISTORY:  He has a personal history of polyps and his half sister  had colon cancer.   SOCIAL HISTORY:  He is married and does not use tobacco or alcohol  products.   REVIEW OF SYSTEMS:  Per the HPI.  Otherwise all systems are negative.   PHYSICAL EXAM:  Temperature 98.6, blood pressure 117/59, pulse 97,  respiratory rate 20, 02 sat 96% on room air.  GENERAL:  He is no apparent distress.  Alert and oriented x4.  HEENT  EXAM:  Atraumatic, normocephalic.  Pupils equal and reactive to light.  Mouth:  No oral lesions.  Posterior pharynx without erythema or exudate.  NECK:  Has full range of motion.  No lymphadenopathy.  LUNGS:  Decreased  breath sounds at the bases bilaterally but fair air movement in other  fields.  CARDIOVASCULAR EXAM:  Regular rhythm.  Unable to appreciate a murmur.  ABDOMEN:  Bowel sounds are present, soft, nontender, nondistended.  No  rebound or guarding.  EXTREMITIES:  His extremities have no cyanosis or  edema.  NEURO:  No focal neurologic deficit.  LABS:  INR 2.1.   ASSESSMENT:  Mr. Bunyard is an 77 year old male with an impacted food  bolus whose subsequent management may be more challenging due to his  history of aspirin and Coumadin.   Thank you for allowing me to see Mr. Ladona Ridgel in consultation.  My  recommendations follow.   RECOMMENDATIONS:  1. Esophagogastroduodenoscopy now and will need to have subsequent      dilation.  2. Will determine when to restart his Coumadin and his aspirin based      on the findings on the procedure and will need input from Dr. Daleen Squibb.      Kassie Mends, M.D.  Electronically Signed     SM/MEDQ  D:  12/11/2007  T:  12/11/2007  Job:  [id number]   cc:   Rhae Lerner. Margretta Ditty, M.D.  [address]  [place]  [place] [address]   Jesse Sans. Wall, MD, FACC  [address]  [address]  [place]  [place] [address]   Kirk Ruths, M.D.  Fax: [phone]

## 2011-01-14 NOTE — Assessment & Plan Note (Signed)
[facility]                       [place] CARDIOLOGY OFFICE NOTE   [name], [name]                         MRN:          [id number]  DATE:09/15/2008                            DOB:          Nov 24, 1921    CARDIOLOGIST:  Dr. Valera Castle.   PRIMARY CARE PHYSICIAN:  Dr. Nobie Putnam.   REASON FOR VISIT:  Atrial fibrillation.   HISTORY OF PRESENT ILLNESS:  [name] is an 77 year old male patient  with a history of paroxysmal atrial fibrillation status post TEE-guided  cardioversion in March of 2009, maintained on Coumadin therapy until  recently when he presented back to Dr. Daleen Squibb with reported history of  bleeding into his eye.  His ophthalmologist is Dr. Fawn Kirk.  I think  Dr. Daleen Squibb spoke to Dr. Luciana Axe over the telephone.  Dr. Daleen Squibb eventually  decided to take the patient off of Coumadin.  He recently developed  recurrent shortness of breath with exertion.  He saw Dr. Nobie Putnam and  was noted to be back in atrial fibrillation.  He was referred today for  further evaluation.  The patient notes that with activity he gets short  of breath.  He describes NYHA class IIB to III symptoms.  He really  denies orthopnea or PND.  He has had some pedal edema and he recently  restarted his Lasix.  He was apparently supposed to be taking this daily  but was not.  We are not sure what dosage of Lasix he is on.  He denies  chest discomfort.  He denies syncope.   CURRENT MEDICATIONS:  Metoprolol XL 50 mg daily, Lisinopril 40 mg daily,  Pravastatin 40 mg daily, Aspirin 81 mg daily, Multivitamin daily,  Nexium 40 mg daily, Amlodipine 10 mg daily, CPAP at bedtime.   ALLERGIES:  MORPHINE.   REVIEW OF SYSTEMS:  He denies fevers or chills.  He has had cough.  His  cough is productive of white, thick sputum.  He denies melena or  hematochezia.  Rest of his review of systems is negative.   PHYSICAL EXAM:  He is a well-nourished, well-developed male.  VITAL SIGNS:   Blood pressure is 110/70.  Pulse 63.  Weight 195 pounds.  HEENT:  Normal.  NECK:  Without JVD at 45 degrees.  CARDIAC:  Normal S1 and S2.  Irregularly irregular rhythm with a 1 to  2/6 systolic ejection murmur head best along the left sternal border.  LUNGS:  With dry crackles at the bases, otherwise clear.  No wheezing.  No rales.  ABDOMEN:  Soft, nontender.  EXTREMITIES:  With trace edema bilaterally.  NEUROLOGIC:  He is alert and oriented x3.  Cranial nerves II-XII grossly  intact.   Six-minute walk test in the office:  The patient's heart rate was 55, in  atrial fibrillation for walking, and his heart rate went to 61 with  walking.   Electrocardiogram reveals atrial fibrillation with a heart rate of 63,  non specific ST-T wave changes, incomplete right bundle branch block.   ASSESSMENT AND PLAN:  1. Recurrent atrial fibrillation in a patient who has a history  of      paroxysmal atrial fibrillation previously on Coumadin therapy.  His      CHADS2 score is 2 given his hypertension and age over 50.  He      recently had some bleeding in his eye and was taken off of      Coumadin.  I actually did have an opportunity to speak to Dr.      Luciana Axe over the telephone.  He explained to me that if the patient      needs Coumadin to prevent stroke then he should be on Coumadin.      The only thing he would ask is to have the patient's INR be closely      maintained in a therapeutic range so as to avoid risking making his      bleeding in his eye worse.  The patient does have an appointment      with Dr. Daleen Squibb on Monday of next week, September 18, 2008.  He was      somewhat uncomfortable seeing a physician's assistant today and I      also had Dr. Dietrich Pates see the patient and talk to him.  Since his      heart rate was not uncontrolled with walking, we will not adjust      any of his medications today.  He will remain on aspirin 81 mg a      day.  He prefers to discuss with Dr. Daleen Squibb whether  or not he should      go back on Coumadin.  Therefore, he will discuss that with Dr. Daleen Squibb      further when he sees him back on September 18, 2008.  2. Coronary disease status post bypass surgery in 2005.  His last      ischemic evaluation was in June of 2009.  His Myoview at that time      demonstrated no scar or ischemia and his ejection fraction was 61%.      He is not having any chest pain and we will defer any further      ischemic workup at this time.  3. Aortic stenosis status post aortic valve replacement with a      bioprosthetic valve in 2005 at the time of his bypass surgery.  His      last echocardiogram was in March of 2009.  At that time, his aortic      valve was well seated without perivalvular leak and his ejection      fraction was 55% to 60%.  He did have mild mitral regurgitation at      that time.  4. Hypertension.  This is overall well controlled.  5. Dyslipidemia.  He is on pravastatin.  6. Peripheral arterial disease status post aortobifemoral bypass      grafting in the past.  7. Obstructive sleep apnea, on CPAP therapy.  8. Cerebrovascular disease.  The patient had recent carotid Dopplers      done that demonstrated 50% to 69% bilateral internal carotid artery      stenosis.  This is stable from previous and he will have a followup      scan done in 1 year.   DISPOSITION:  The patient will follow up with Dr. Daleen Squibb in our [facility]  office on Monday, September 18, 2008, to discuss whether or not to go back  on Coumadin      Tereso Newcomer, PA-C  Electronically Signed  Gerrit Friends. Dietrich Pates, MD, [facility]  Electronically Signed   SW/MedQ  DD: 09/15/2008  DT: 09/15/2008  Job #: [id number]   cc:   Patrica Duel, M.D.

## 2011-01-14 NOTE — Cardiovascular Report (Signed)
[name], [name]                  ACCOUNT NO.:  [id number]   MEDICAL RECORD NO.:  [id number]          PATIENT TYPE:  INP   LOCATION:  4734                         FACILITY:  [facility]   PHYSICIAN:  Jonelle Sidle, MD DATE OF BIRTH:  [birth year]   DATE OF PROCEDURE:  11/24/2007  DATE OF DISCHARGE:                            CARDIAC CATHETERIZATION   DIRECT CURRENT CARDIOVERSION REPORT:   PRIMARY CARDIOLOGIST:  Maisie Fus C. Wall, MD, [facility].   REQUESTING CARDIOLOGIST:  Bevelyn Buckles. Bensimhon, MD.   INDICATIONS:  Mr. Ruddy is an 77 year old gentleman with a history of  bioprosthetic aortic valve replacement and Maze procedure for atrial  fibrillation.  He presents to the hospital now with progressive  shortness of breath and atrial fibrillation and is referred for a  transesophageal echocardiogram guided cardioversion.  He was initiated  on heparin and Coumadin.  The potential risks and benefits were  explained to him in advance and informed consent was obtained.   DESCRIPTION OF PROCEDURE:  Following transesophageal echocardiogram  revealing no obvious intracardiac thrombus (see full report elsewhere),  standard anterior and posterior pads were placed.  The patient was  adequately sedated with the assistance of the anesthesia service.  Using  a biphasic defibrillator, a single 150-joule, synchronized shock was  delivered with successful restoration of normal sinus rhythm.  The  patient remained hemodynamically stable throughout and there were no  obvious complications.   DISPOSITION:  The patient will be observed in the endoscopy suite and  then returned to his room for further monitoring.  He continues on  heparin infusion at this time.      Jonelle Sidle, MD  Electronically Signed     SGM/MEDQ  D:  11/24/2007  T:  11/25/2007  Job:  [id number]   cc:   Thomas C. Wall, MD, [facility]

## 2011-01-14 NOTE — Assessment & Plan Note (Signed)
[facility]                            CARDIOLOGY OFFICE NOTE   PINCHOS, TOPEL                         MRN:          [id number]  DATE:09/18/2008                            DOB:          1921/12/21    [name] comes in today discuss recurrent atrial fib and his risk of a  stroke.   About a week ago, Monday, he felt his pulse and it was irregular.  He  was also more winded.   He went to see Dr. Nobie Putnam, who sending him over to see Dr. Dietrich Pates  and Tereso Newcomer in the [facility] on the 15th.   See the extensive note by Tereso Newcomer, which I have reviewed today.   [name] wants to talk to me before switching back over to Coumadin.   He has had taken some Lasix for some lower extremity edema p.r.n.  He is  a little more winded, but has gotten better.   He denies any symptoms of TIAs or CVA.   He continues to be on aspirin 81 mg a day.   PHYSICAL EXAMINATION:  VITAL SIGNS:  Blood pressure 130/67, his pulse is  60-65 and irregular.  His weight is 198.  HEENT:  Unchanged.  NECK:  Carotids are full with bilateral bruits.  Thyroid is not  enlarged.  Trachea is midline.  LUNGS:  Clear to auscultation and percussion.  HEART:  Regular rate and rhythm with this well-controlled.  He has a  soft systolic murmur along the left sternal border.  ABDOMEN:  Soft with good bowel sounds.  There is no pulsatile mass.  EXTREMITIES:  No significant edema.  Pulses are intact.  NEUROLOGIC:  Intact.   I have repeated an EKG to confirm atrial fib with a well-controlled  ventricular rate.   I had a thorough discussion with [name].  I think he needs  go back on Coumadin.  I have cleared this with Dr. Fawn Kirk as seen  in my previous notes.  Yes, his risk is a little bit higher with  bleeding into his high but his risk of stroke is greater in my opinion.   We are starting back on his Coumadin dose that he was on prior to  discontinuation.  We will having get a protime this coming Friday in  [place].  I will see him back in February.      Thomas C. Daleen Squibb, MD, [facility]  Electronically Signed    TCW/MedQ  DD: 09/18/2008  DT: 09/18/2008  Job #: [id number]   cc:   Patrica Duel, M.D.

## 2011-01-14 NOTE — H&P (Signed)
[name], [name]                  ACCOUNT NO.:  [id number]   MEDICAL RECORD NO.:  [id number]          PATIENT TYPE:  INP   LOCATION:  4734                         FACILITY:  [facility]   PHYSICIAN:  Bevelyn Buckles. Bensimhon, MDDATE OF BIRTH:  13-Jun-1922   DATE OF ADMISSION:  11/23/2007  DATE OF DISCHARGE:                              HISTORY & PHYSICAL   DATE OF ADMISSION:  November 23, 2007.   PRIMARY CARDIOLOGIST:  Jesse Sans. Wall, MD, [facility].   PERIPHERAL VASCULAR DISEASE PHYSICIAN:  Previously Randa Evens, MD   PRIMARY CARE PHYSICIAN:  Patrica Duel, M.D.   PULMONARY PHYSICIAN:  Charlcie Cradle. Delford Field, MD, [facility]   Mr. [name] is an 77 year old Caucasian gentleman with known history of  coronary artery disease.  History of atrial fib fibrillation status post  MAZE procedure done at time of bypass in 2005.  Mr. [name] presents to  [facility] emergency room today complaining of nausea, shortness of  breath times 1 week. He was sent here by his primary care physician  office where his wife was insisting that he be evaluated.  She states  Mr. [name] has been more fatigued and short of breath times 1 month.  However, the last week he has been nauseated intermittently.  And just  feeling extremely tired.  Mr. [name] is also complaining of some pain  between his shoulder blades.  He initially stated the pain was in his  shoulders and felt like his arthritic pain, however, upon further  discussion, the pain is more localized between his shoulder blades.  He  describes it as a throbbing ache.  Is a somewhat poor historian at  times.  His wife and daughter also contributing to history of present  illness.  Mr. [name] also states he has gained weight, but he does not  however weigh himself at home.  Previously, was more active and did yard  work and walked on the treadmill. States he has not walked on his  treadmill for about 1 month because of increased shortness of breath and  peripheral neuropathy. He  denies any episodes of chest pain.  States he  has had occasional orthopnea when he has fallen asleep without his CPAP  on, but for the most part he is compliant with his nightly CPAP therapy.  He complains of some mild peripheral edema and occasional chills.   PAST MEDICAL HISTORY:  1. Hypertension.  2. Dyslipidemia.  3. Obstructive sleep apnea with compliance to continuous positive      airway pressure.  4. Peripheral vascular disease.      a.     Status post aortobifemoral bypass grafting.      b.     Focal stenosis within the left superficial femoral artery       with a cauliflower like lesion which is substantially calcified.      c.     ABI has been stable for years at 0.68, left ABI 0.52.      d.     INTOLERANCE TO PLETAL SECONDARY TO STOMACH/GI DISCOMFORT.  5. Peripheral neuropathy.  6. History  of atrial fib status post Cox-MAZE procedure in 2005.  7. Status post stress Myoview 2007, ejection fraction of 60%; no      ischemia.  8. Coronary artery disease status post coronary artery  bypass      grafting in 2005 x3 (with a saphenous vein graft to the distal left      anterior descending artery, saphenous vein graft to the circumflex      marginal branch, saphenous vein graft to posterior descending      artery, endoscopic saphenous vein graft harvest from left thigh and      left lower leg).  9. Aortic stenosis status post aortic valve prosthetic replacement at      time of bypass in 2005.  10.Cerebrovascular disease.      a.     Carotid Dopplers April 2008, which showed stable disease, 40-       59%, right internal carotid artery stenosis, 60-79% left internal       carotid artery stenosis with antegrade flow in both vertebrals.  11.Hypertension.  12.Hyperlipidemia.  13.Obesity   ALLERGIES:  NO KNOWN DRUG ALLERGIES.   MEDICATIONS:  1. Amlodipine 10 mg.  2. Metoprolol 25.  3. Lisinopril 40.  4. Zetia 10.  5. Nexium.  6. Aspirin 81.  7. Pravastatin 40.  8.  Continuous positive airway pressure nightly.   SOCIAL HISTORY:  The patient lives in [place] with his wife.  He is  a retired Academic librarian.  He has adult children.  Denies any tobacco,  drugs or herbal medication or alcohol use.  He previously walks on a  treadmill and does yard work for  exercise.  However, has not been able  to do much of this for the last month secondary to history of present  illness.  No diet restrictions.   FAMILY HISTORY:  Mother deceased.  She apparently collapsed at a young  age, unknown cause of death. Father known heart disease.  Siblings with  no known heart disease, positive for cancer.   REVIEW OF SYSTEMS:  Positive for chills, dyspnea on exertion, orthopnea,  edema, pre-syncope, generalized weakness, pain between shoulder blades  and nausea.  All other systems reviewed and negative.   PHYSICAL EXAM:  Afebrile, pulse 75, respirations 16, blood pressure  120/64, sat 95% on room air.  No acute distress.  HEENT: Normal.  The patient extremely hard of hearing with hearing aids  in.  NECK:  Supple with no lymphadenopathy or bruits.  JVD around 9-10 cm at  45 degrees angle.  LUNGS:  Diminished throughout.  CARDIOVASCULAR:  Exam reveals S1-S2, distant heart sounds, S2 is crisp.  SKIN:  Skin is warm and dry.  ABDOMEN:  Soft, nontender, distended with positive bowel sounds.  LOWER EXTREMITIES: Without clubbing, cyanosis.  He has +1 edema  bilaterally.  NEUROLOGICAL:  Alert and oriented x3.   Chest x-ray is pending.  EKG: atrial fib at a rate of 80.  Lab work is  pending.   IMPRESSION:  Atrial fib, controlled rate.  The patient symptomatic.  Showing signs of mild volume overload despite adequate rate control.  Will diurese with Lasix and schedule the patient for a TEE with  cardioversion tomorrow and start heparin/Coumadin per pharmacy.  Consider outpatient Myoview.  Dr. Nicholes Mango was in to examine and  assess the patient and agrees with plan of  care.      Dorian Pod, ACNP      Bevelyn Buckles. Bensimhon, MD  Electronically Signed  MB/MEDQ  D:  11/23/2007  T:  11/23/2007  Job:  [id number]

## 2011-01-17 NOTE — Op Note (Signed)
NAMECORRAN, [name]                            ACCOUNT NO.:  [id number]   MEDICAL RECORD NO.:  [id number]                   PATIENT TYPE:  OUT   LOCATION:  XRAY                                 FACILITY:  [facility]   PHYSICIAN:  Guadalupe Maple, M.D.               DATE OF BIRTH:  15-May-1922   DATE OF PROCEDURE:  10/31/2003  DATE OF DISCHARGE:                                 OPERATIVE REPORT   PROCEDURE:  Intraoperative transesophageal echocardiography.   ANESTHESIOLOGIST:  Guadalupe Maple, M.D.   HISTORY:  Mr. Addison Whidbee is a 77 year old white male with a history of  coronary artery disease and moderate aortic stenosis who is scheduled to  undergo coronary artery bypass grafting and aortic valve replacement by Dr.  Cornelius Moras under general anesthesia .  The transesophageal echocardiography was  requested to evaluate the aortic valve and determine if any other valvular  pathology was present and assess the left ventricular function.   DESCRIPTION OF PROCEDURE:  The patient was brought to the operating room at  [facility].  General anesthesia was induced without difficulty.  The trachea was intubated without difficulty.  The transesophageal  echocardiography probe was inserted into the esophagus without difficulty.   IMPRESSION:  PRE-BYPASS FINDINGS:  1. Mild to moderate aortic stenosis.  The aortic valve was three leaflet.     It was moderate calcified with restricted opening.  The plain imagery of     the aortic valve revealed valve area of 1.3 cm squared.  The continuous     wave Doppler interrogation of the aortic valve revealed peak gradient of     54 mmHg and a mean gradient of 36 mmHg.  The aortic valve area by     continuity equation varied between 1.4 and 1.8 cm squared.  The aortic     annulus size measured 2.63 cm.  The aortic root at the sinuses of     Valsalva measured 3.64, and the proximal aorta at the sinotubular ridge     measured 2.75 cm in diameter.  There was 1+  aortic insufficiency present.  2. The mitral valve structurally appeared normal.  The leaflets were     moderately thickened.  There was no prolapse or fluttering, and there was     1+ mitral insufficiency with a central jet.  3. There was mild to moderate left ventricular hypertrophy with left     ventricular wall thickness of 1.6 cm at the mid papillary level at end-     diastole.  There was mildly reduced contractility globally of the left     ventricular wall with ejection fraction estimated at 50%.  4. Right ventricular function appeared to be  within normal limits.  There     was good contractility of the right ventricular free wall, and the septum     was not flattened.  5. The tricuspid  valve showed 1+ tricuspid insufficiency.  6. The inner atrial septum was intact without evidence of patent foramen     ovale.  7. The left atrial size appeared moderately enlarged without evidence of     thrombus in the left atrium or left atrial appendage.   POST BYPASS FINDINGS:  1. There was evidence of a bioprosthetic aortic valve placed.  The leaflets     opened well, and there was no aortic insufficiency appreciated.  The     prosthesis appeared well seated.  2. There was 1+ mitral insufficiency unchanged from the pre-bypass study.  3. Left ventricular function again showed ejection fraction of 50% with     global to mild global hypokinesis, but all segments interrogated were     contracting.                                               Guadalupe Maple, M.D.    DCJ/MEDQ  D:  10/31/2003  T:  10/31/2003  Job:  [id number]

## 2011-01-17 NOTE — Progress Notes (Signed)
[facility]                        PERIPHERAL VASCULAR OFFICE NOTE   TRUEMAN, WORLDS                         MRN:          [id number]  DATE:09/09/2006                            DOB:          [birth year]    HISTORY OF PRESENT ILLNESS:  Mr. [name] is an 77 year old gentleman who  is status post aortobifemoral bypass grafting.  He has focal stenosis  within the left superficial femoral artery with a cauliflower-like  lesion which is substantially calcified.  His right ABI has been stable  for years at 0.68.  The left ABI is 0.52.   His principal lower extremity complaint is of pins and needle sensations  in both feet occurring particularly after sitting or lying for a long  period.  This improves with walking.  He has had no tissue loss on his  feet.  He does get some cramping discomfort in his left calf with  prolonged walking.   CURRENT MEDICATIONS:  1. Norvasc 10 mg daily.  2. Toprol XL 25 mg daily.  3. Lisinopril 40 mg per day.  4. Zetia 10 mg per day.  5. Pravastatin 40 mg daily.  6. Aspirin 81 mg per day.  7. Multivitamin.  8. CPAP.   PHYSICAL EXAMINATION:  GENERAL APPEARANCE:  He is generally well-  appearing and in no distress.  VITAL SIGNS: Heart rate 64.  Blood pressure 130/64.  NECK:  He has no jugular venous distention or thyromegaly.  LYMPH NODES:  He does have symmetrically enlarged lymph nodes within the  neck.  There is no supraclavicular or axillary lymphadenopathy.  CHEST:  His respiratory effort is normal.  Lungs are clear to  auscultation.  HEART: He has a nondisplaced point of maximal cardiac impulse.  There is  a regular rate and rhythm without murmurs, rubs or gallops.  ABDOMEN:  Soft, nontender, nondistended.  There is no  hepatosplenomegaly.  Bowel sounds are normal.  EXTREMITIES:  The extremities are warm without cyanosis, clubbing, edema  or ulceration.  Dorsalis pedis pulses and posterior pulses are not  palpable on either side.   IMPRESSION AND RECOMMENDATIONS:  1. Paresthesias in feet.  Symptoms most consistent with a neuropathy.      The patient does not have diabetes.  Will check B12 level and ask      him to follow up with Dr. Regino Schultze.  2. Lower extremity vascular disease.  Mildly symptomatic on the left,      asymptomatic on the right.  Did not tolerate Pletal due to stomach      upset.  He is unable to exercise much due to the neuropathic      discomfort in his feet.  Hopefully, we can gain some improvement in      this discomfort, allowing him to walk more.  With that, I would      expect his claudication threshold to improve.  3. Cervical lymphadenopathy.  I discussed with Dr. Delford Field today.  Will      check CT scan with contrast of the neck and chest.  The patient has  a previously scheduled appointment with Dr. Delford Field in the next few      weeks.  Dr. Delford Field will discuss the results with Mr. Edgington.     Salvadore Farber, MD  Electronically Signed    WED/MedQ  DD: 09/09/2006  DT: 09/09/2006  Job #: [id number]   cc:   Kirk Ruths, M.D.  Thomas C. Daleen Squibb, MD, [facility]  Charlcie Cradle. Delford Field, MD, FCCP

## 2011-01-17 NOTE — Assessment & Plan Note (Signed)
[facility]                              CARDIOLOGY OFFICE NOTE   WASSIM, KIRKSEY                         MRN:          [id number]  DATE:07/27/2006                            DOB:          01/10/1922    Mr. Colee comes in today for further management of the following issues:  1. Coronary artery disease status post coronary artery bypass grafting      March 2005.  He has normal left ventricular systolic function and his      last adenosine Myoview showed EF of 60% with slight decrease in      activity in the inferior wall similar to 2006.  There was no definite      ischemia.  He is having no angina.  2. Status post aortic stenosis and aortic valve replacement bioprosthetic.  3. Cerebrovascular disease.  Carotid Dopplers were stable December 26, 2005.      He had a suggestion of a mass at the level of the carotid bifurcation      bilaterally.  Head CT was done and there was no sign of a mass.  4. Peripheral vascular disease.  He is followed by Dr. Florentina Addison.  He      missed his last appointment and is due followup.  He is still having a      lot of problems with claudication.  5. Hypertension.  6. Dyslipidemia.  Lipids at goal last check May 2007 except for low HDL,      which is chronic.  7. Sleep apnea.  8. Gastroesophageal reflux.   His biggest problem today is claudication.   His medications are unchanged since last visit.  Refer to the maintenance  medication list.   EXAMINATION:  VITAL SIGNS:  His blood pressure today is 118/62, pulse is  196.  GENERAL:  He is in no acute distress.  SKIN:  Warm and dry.  HEENT:  Normocephalic, atraumatic.  PERRLA, sclerae are clear.  NECK:  Carotids are full with soft bruits bilaterally.  There is no  thyromegaly.  Trachea is midline.  HEART:  Sternotomy site is stable.  He has a systolic murmur along the left  sternal border but no diastolic component.  LUNGS:  Clear.  ABDOMEN:  Soft with good bowel  sounds.  There is no obvious midline bruit.  There is no hepatomegaly.  EXTREMITIES:  Reveal decreased pulses bilaterally to nearly absent.  He has  no peripheral edema.  SKIN:  Shows some chronic ischemic changes.   Electrocardiogram shows sinus rhythm with a first degree AV block, RSR-prime  in V1 and V2.  This is basically unchanged.   I think Mr. Seamans is stable from our standpoint.  I have arranged him a  followup with Dr. Samule Ohm.  I will see him back myself in 6 months.     Thomas C. Daleen Squibb, MD, [facility]  Electronically Signed    TCW/MedQ  DD: 07/27/2006  DT: 07/27/2006  Job #: [id number]   cc:   Patrica Duel, M.D.

## 2011-01-17 NOTE — Op Note (Signed)
NAMEALDRIDGE, KRZYZANOWSKI                            ACCOUNT NO.:  [id number]   MEDICAL RECORD NO.:  [id number]                   PATIENT TYPE:  OUT   LOCATION:  XRAY                                 FACILITY:  [facility]   PHYSICIAN:  Salvatore Decent. Cornelius Moras, M.D.              DATE OF BIRTH:  19-Aug-1922   DATE OF PROCEDURE:  10/31/2003  DATE OF DISCHARGE:                                 OPERATIVE REPORT   PREOPERATIVE DIAGNOSIS:  Aortic stenosis, coronary artery disease,  persistent atrial fibrillation.   POSTOPERATIVE DIAGNOSIS:  Aortic stenosis, coronary artery disease,  persistent atrial fibrillation.   PROCEDURE:  Median sternotomy for aortic valve replacement (#25 mm CE Magnum  pericardial valve), coronary artery bypass grafting x 3 (saphenous vein  graft to distal left anterior descending coronary artery, saphenous vein  graft to circumflex marginal branch, saphenous vein graft to posterior  descending coronary artery, endoscopic saphenous vein harvest from left  thigh and left lower leg), modified Cox-Maze IV procedure.   SURGEON:  Salvatore Decent. Cornelius Moras, M.D.   ASSISTANT:  Salvatore Decent. Dorris Fetch, M.D.   SECOND ASSISTANT:  Coral Ceo, P.A.   ANESTHESIA:  General.   BRIEF CLINICAL NOTE:  The patient is a 77 year old male from [place]  followed by Dr. Patrica Duel and Dr. Juanito Doom with known history of  coronary artery disease, aortic stenosis, hypertension, and peripheral  vascular disease.  He presents with progressive symptoms of exertional  shortness of breath.  He is noted to have developed atrial fibrillation and  remains in persistent atrial fibrillation over the last several weeks  following presentation.  Stress Cardiolite exam is abnormal prompting  cardiac catheterization performed by Dr. Loraine Leriche Pulsipher.  This demonstrates  the presence of high grade stenosis of the right coronary artery with ostial  stenosis of the left main coronary artery and moderate aortic stenosis with  mild left ventricular  dysfunction.  Follow up 2D echocardiogram performed  prior to admission confirmed the presence of progression of the patient's  underlying aortic stenosis with peak and mean transbasilar gradients of 49  and 26 mmHg respectively corresponding to a calculated aortic valve area of  0.65 square cm.  A full consultation note has been dictated previously.   OPERATIVE CONSENT:  The patient and his family have been counseled at length  regarding the indications and potential benefits of aortic valve  replacement, coronary artery bypass grafting, and concomitant Maze procedure  for surgical treatment of atrial fibrillation.  Alternative treatment  strategies have been discussed in detail including and most notably the  possibility of percutaneous coronary intervention of the right coronary  artery with continued medical therapy for his aortic stenosis and his atrial  fibrillation.  After a considerable discussion, the patient desires to  proceed with surgical intervention for definitive treatment.  The patient  accepts all the associated risks of surgery including but not limited to the  risks of death, stroke, myocardial infarction, congestive heart failure,  respiratory failure, pneumonia, bleeding requiring blood transfusion,  arrhythmia, heart block or bradycardia requiring permanent pacemaker,  infection, recurrent coronary artery disease, recurrent atrial fibrillation.  All his questions have been addressed.   OPERATIVE NOTE IN DETAIL:  The patient was brought to the operating room on  the above mentioned date and central monitoring is established by the  anesthesia service under the care and direction of Dr. Kipp Brood.  Specifically, a Swan-Ganz catheter was placed through the right internal  jugular approach.  A radial arterial line was placed.  Intravenous  antibiotics are administered.  Following induction with general endotracheal  anesthesia, a Foley catheter  was placed.  The patient's chest, abdomen, both  groins, and both lower extremities are prepped and draped in a sterile  manner.  Baseline transesophageal echocardiogram is performed by Dr. Noreene Larsson.  This demonstrates the presence of moderate aortic stenosis.  The aortic  valve is tricuspid and there is some severe calcification in the  commissures, particularly in the commissure between the left and right cusps  of the valve.  All three cusps of the valve do not move terribly well.  The  aortic valve area is estimated at 1.3 cm square.  There is mild aortic  insufficiency.  There is mild mitral regurgitation.  The left ventricular  function is mildly reduced but, overall, reasonably well preserved.  No  other significant abnormalities are identified.   A median sternotomy incision is performed and the left internal mammary  artery dissected from the chest wall and prepared for bypass grafting.  The  left internal mammary artery was sclerotic and it had very poor flow.  Subsequently, the mammary artery is transected proximally just after its  take off from the subclavian artery and the proximal stump is doubly  oversewn with suture ligatures.  The mammary artery is probed and is patent  but, of note, the vessel is felt to be of relatively poor quality.  Therefore, the mammary artery is not utilized.  Simultaneously, saphenous  vein was obtained from the patient's left thigh and the upper portion of the  left lower leg using endoscopic vein harvest technique.  The saphenous vein  graft is a good quality conduit.  The patient was heparinized systemically.   The pericardium was opened.  The ascending aorta is mildly sclerotic.  There  is some hard, calcified plaque in the transverse arch but an acceptable site  is chosen for cannulation at the site of the innominate artery take off.  The aorta is cannulated uneventfully.  A venous cannula is placed directly in the superior vena cava and a  second venous cannula is placed low in the  right atrium with tip extended down the inferior vena cava.  A retrograde  cardioplegia catheter was placed through the ascending aorta into the  coronary sinus.  Adequate heparinization is verified.  Cardiopulmonary  bypass is begun.  Umbilical loops are placed around the superior vena cava  and the inferior vena cava.  Distal sites are selected for coronary bypass  grafting.  Portions of saphenous vein are trimmed to the appropriate length  and prepared for grafting.  A temporary probe is placed in the left  ventricular  septum.  A cardioplegia catheter is placed in the ascending  aorta.   The patient was cooled to 28 degrees systemic temperature.  The aortic Cross  clamp was applied and cardioplegia delivered initially in an antegrade  fashion through the aortic root.  Ice saline slush is applied for topical  hypothermia.  Supplemental cardioplegia is administered retrograde through  the coronary sinus catheter.  The initial cardioplegia arrest and myocardial  cooling are felt to be excellent.  Repeat doses of cardioplegia are  administered intermittently throughout the crossclamp portion of the  operation, both through the coronary sinus and antegrade down the  subsequently placed vein grafts to maintain septal temperature below 15  degrees Centigrade.   The heart is gently retracted towards the surgeons side of the table  exposing the reflection of the pericardium along the left sided pulmonary  veins.  A Harkin clamp is placed around the origins of the left pulmonary  veins and an umbilical loop is passed around the veins to facilitate  retraction.  An elliptical ablation lesion is now created around the origin  of the left pulmonary veins on the left atrial wall tissue, itself.  This is  performed using the Medtronic Cardioblate bipolar epicardial irrigated  radiofrequency ablation device.  This is technically straight forward and   performed uneventfully.  A similar elliptical lesion is now performed around  the base of the left atrial appendage.  The unipolar irrigated  radiofrequency ablation device is now used to create a short linear lesion  joining the ellipse surrounding the left sided pulmonary veins with the  ellipse surrounding the base of the left atrial appendage.   The following distal coronary anastomoses are performed:   1. The circumflex marginal branch is grafted with a saphenous vein graft in     an end-to-side fashion.  This coronary measures 2 mm and is of good     quality.  2. The distal left anterior descending coronary artery is grafted with a     saphenous vein graft in an end-to-side fashion.  This coronary measures 2     mm in diameter and is of good quality.  3. The posterior descending coronary artery is grafted with a saphenous vein     graft in an end-to-side fashion.  This coronary measures 2 mm in diameter     and is of good quality.  A left atriotomy incision is now performed posteriorly through the intra-  atrial groove and the inside of the left atrium is examined.  An elliptical  ablation lesion is now created around the right sided pulmonary veins using  the bipolar irrigated radiofrequency ablation device.  The atriotomy  incision serves as the anterior half of the ellipse with the posterior  ellipse constructed using the ablation device with one limb of the device  placed within the left atrium and the other limb placed posterior to the  left atrium.  Following this, two longitudinal transverse incisions are  placed across the floor of the left atrium to join elliptical lesion  surrounding the left sided veins with the ellipse surrounding the right  sided pulmonary veins.  One of these lesions is performed from the cephalad  most extent of the atriotomy incision across the roof of the atrium and a  second parallel longitudinal incision is placed from the caudad most extent   of the atriotomy incision across the back wall of the left atrium.  Following this, a linear incision is created with the bipolar device  directed towards the mitral valve annulus across the back wall of the left  atrium extending from the caudad end of the atriotomy incision.  Each of  these three longitudinal incisions are performed using the  bipolar device  with one limb placed posterior to the left atrium and one within the left  atrium.  After completion of this, the unipolar radiofrequency ablation  device is utilized to complete the linear ablation lesion all the way to the  mitral valve annulus near the junction between the P2 and P3 portions of the  mitral valve annulus.  This completes the entire left sided lesion set of  the modified Cox-Maze procedure.   An oblique aortotomy incision is performed and the aortic valve is exposed.  Of note, there is no significant stenosis located at the ostium of the left  main coronary artery which is widely patent.  There is some plaque in the  wall of the aorta at this level, but clearly no significant stenosis.  The  aortic valve is tricuspid and stenotic.  It is considered moderately  stenotic and mildly insufficient.  The aortic valve is excised sharply.  The  aortic annulus is decalcified.  This is technically straight forward.  After  completion of decalcification of the annulus, the entire aortic annulus en  route and left ventricle are copiously irrigated with iced saline solution.  The aortic annulus is sized and will accept a 25 mm CE pericardial valve.  Aortic valve replacement is performed using interrupted 2-0 Ethibond  horizontal mattress pledgeted sutures with pledgets in the subannular  position.  A CE Magnum pericardial tissue valve (model number 3000, serial  number [id number]) is secured in place uneventfully.  The 25 mm valve fits in  nicely and is seated uneventfully.  After completion of valve replacement, the left main and  the right coronary artery ostia are carefully examined and  notably well away from the valve annulus.  The valve appears to be seated  well and there is no sign of any abnormality with the closure.  The  aortotomy is now closed using a standard two layered closure of running 4-0  Prolene suture.  Rewarming is begun.   Additional cardioplegia is administered antegrade through the saphenous vein  grafts and each of the grafts is then trimmed to an appropriate length.  The  proximal anastomoses of each of the saphenous vein grafts is performed  directly to the ascending aorta prior to removal of the aortic Cross clamp.  The tip of the right atrial appendage is amputated.  An oblique right  atriotomy incision is now performed extending from the acute margin of the  heart towards the posterior aspect of the right atrium immediately anterior  to the origin of the right inferior pulmonary vein.  Now, the Medtronic  Cardioblate bipolar irrigated radiofrequency ablation device is utilized to  perform several longitudinal ablation lesions in the right atrium.  The  first extends from the apex of the atriotomy incision in a cephalad  direction up to the junction of the superior vena cava in the right atrium  with care taken to stay well away from the sinus node.  A second lesion is  then extended in the opposite direction down to the inferior vena cava  origin.  A third lesion is then extended from the apex of this atriotomy  incision across the inferior septum to the inferior rim of the fossa ovales.  This was performed with one limb of the ablation device in the left atrium  and the second limb of the device within the right atrium.  After this, a  bipolar lesion is created from the apex of the right atrial appendage  extending  in a lateral direction originated perpendicular to the more  lateral located atriotomy incision.  Finally, one last bipolar ablation  lesion is placed extending from the  apex of the right atrial appendage  towards the acute margin of the heart anteriorly.   The left atriotomy incision is now closed using a standard two layer closure  of running 3-0 Prolene suture.  The patient is placed in Trendelenburg  position and one final dose of warm retrograde hot shot cardioplegia is  administered.  All air is evacuated from the aortic root while the lungs are  ventilated.  The aortic Cross clamp was removed after a total crossclamp  time of 138 minutes.  The heart begins to beat spontaneously without the  need for cardioversion.  A Magoon needle was placed in the ascending aorta  to serve as a root vent.  At this juncture, the remainder of the right sided  lesion set of the Maze procedure is performed using the unipolar irrigated  radiofrequency ablation device.  Initially, the lesion is continuued from  the inferior rim of the fossa ovalis across the posterior rim of the  coronary sinus and across the isthmus of the right atrium to reach the posterior rim of the annulus of the tricuspid valve.  A similar lesion is  then created across the isthmus to reach the inferior vena cava on the more  posterior medial aspect.  Another linear incision is then created from the  apex of the right atriotomy incision near the acute margin of the heart to  reach the anterolateral border of the tricuspid valve annulus.  Finally, one  last short linear lesion is created from the anteromedial aspect of the  tricuspid valve annulus to reach the previous bipolar lesion noted  anteriorly at the base of the right atrial appendage.  This completes the  entire right sided lesion set of the Cox-Maze procedure.  The right atrial  incisions are now closed using a two layered closure of running 3-0 Prolene  suture.   All proximal and distal anastomoses are inspected for hemostasis as is the  aortotomy and all atriotomy incisions.  Epicardial pacing wires are affixed  to the right  ventricular outflow tract and to the right atrium.  Normal  sinus rhythm resumed spontaneously.  The IVC cannula is removed and its  cannulation site is doubly oversewn with a Prolene suture.  The patient is  rewarmed to 37 degrees Centigrade temperature.  The patient is weaned from  cardiopulmonary bypass without difficulty.  The patient's rhythm at  separation from bypass is normal sinus rhythm.  The patient is weaned from  bypass on low dose Dopamine at 3 mcg per kg per minute.  Total  cardiopulmonary bypass time for the operation is 187 minutes.  Follow up  transesophageal echocardiogram performed by Dr. Noreene Larsson after separation from  bypass demonstrates preserved left ventricular  function with a well seated  aortic valve prosthesis in the aortic position.  There is no evidence of any  perivalvular leak.  There remains trace mild mitral regurgitation.  There is  insignificant residual air.  No other abnormalities are identified.  The  aortic root vent is removed.  The SVC cannula is removed and the cannulation  site doubly oversewn with Prolene suture.  The Swan-Ganz catheter is now  gently retracted by DR. Joslin to make sure it was free from any suture line  closures.  The aortic cannula is now removed.  Protamine is administered to  reverse the anticoagulation.  The mediastinum and the left chest are  irrigated with saline solution containing vancomycin.  Meticulous surgical  hemostasis is ascertained.  The patient is transfused one ten pack of  platelets due to thrombocytopenia during cardiopulmonary bypass.  The  mediastinum and the left chest are drained with three chest tubes placed  through separate stab incisions inferiorly.  The median sternotomy incision  is closed in routine fashion.  The left lower extremity incision is closed  in multiple layers in routine fashion. All skin incisions are closed with  subcuticular skin closure.   The patient tolerated the procedure well  and is transported to the surgical intensive care unit in stable condition.  There are no interoperative  complications.  All sponge, instrument, and needle counts were verified as  correct at the completion of the operation.                                               Salvatore Decent. Cornelius Moras, M.D.    CHO/MEDQ  D:  10/31/2003  T:  10/31/2003  Job:  [id number]   cc:   Thomas C. Daleen Squibb, M.D.   Carole Binning, M.D. [facility]   Patrica Duel, M.D.  [address], [address]  [place]  [place] [address]  Fax: [phone]

## 2011-01-17 NOTE — Consult Note (Signed)
NAMEHAYATO, [name]                            ACCOUNT NO.:  [id number]   MEDICAL RECORD NO.:  [id number]                   PATIENT TYPE:  INP   LOCATION:  2025                                 FACILITY:  [facility]   PHYSICIAN:  Shan Levans, M.D. [facility]            DATE OF BIRTH:  08-09-1922   DATE OF CONSULTATION:  10/26/2003  DATE OF DISCHARGE:                                   CONSULTATION   INDICATIONS:  Evaluate for preop clearance for CABG.   HISTORY OF PRESENT ILLNESS:  This 77 year old white male was admitted on  October 25, 2003, for cardiac catheterization.  He had been having  progressive dyspnea and was found to have, on his Cardiolite, new ischemic  changes and was admitted for this.  He also had new onset of atrial  fibrillation and dyspnea with this with mildly decreased LV systolic  function.  Cardiolite shows an ejection fraction at 48% with inferior apical  wall ischemia which was a change.  He had increased dyspnea on exertion.  Cardiac catheterization showed mildly decreased LVEF and three-vessel  coronary artery disease that has progressed.  He also has aortic stenosis  and is in need for bypass surgery and aortic valve replacement.  From a  pulmonary standpoint, the patient has obstructive sleep apnea.  He is on a  setting of 12 cm of water pressure via nasal mask at night.  He has no  pulmonary complaints.  No respiratory issues.  He denies any history of  asthma or bronchitis in the past.   PAST MEDICAL HISTORY:  1. Sleep apnea.  2. History of hypercholesterolemia.  3. Hypertension.  4. Peripheral vascular disease.  5. Cerebrovascular disease.  6. Internal carotid artery nonobstructive plaque.  7. Previous history of coronary artery disease with stenting in the right     coronary in the past with a mild infarct in the past in the inferior     distribution.   ALLERGIES:  None.   MEDICATIONS PRIOR TO ADMISSION:  Pravachol, Norvasc, Accupril, aspirin.   FAMILY HISTORY:  As noted in the past, without change.   SOCIAL HISTORY:  As noted in the past, without change.   PHYSICAL EXAMINATION:  VITAL SIGNS:  Temperature 97; blood pressure 107/52;  pulse 52; respirations 20; sat 96% room air.  CHEST:  Clear without evidence of wheeze or rhonchi.  CARDIAC:  Regular rate and rhythm in S3.  Normal S1, S2.  ABDOMEN:  Soft and nontender.  EXTREMITIES:  No edema or clubbing.  NEUROLOGIC:  Intact.   LABORATORY DATA:  Includes, on room air, pH 7.41, pCO2 38, pO2 80.  Hemoglobin A1c 6.1.  Sodium 136, potassium 4.1, chloride 104, CO2 26, BUN  18, creatinine 1.1, blood sugar 124.  Liver function tests unremarkable.  CBC; white count 7.7, hemoglobin 14.  X-ray reports; chest x-ray shows  stable chronic obstructive lung disease pattern with pleural thickening,  no  acute findings.   IMPRESSION:  Obstructive sleep apnea without significant chronic obstructive  lung disease.  The patient is an ex smoker, but does not have active  pulmonary process at this time.  The patient, at this point, is not a  contraindication to planned bypass surgery.  The patient does have  obstructive sleep apnea and is on adequate settings for this at night via  CPAP mask.   RECOMMENDATIONS:  Cleared for planned bypass surgery and aortic valve  replacement.  No pulmonary contraindications.  Please make sure the patient  wears his CPAP mask following extubation as he recovers from anesthesia and  use a setting of 12 cm water pressure via nasal mask at bedtime.  He might  even want to use his machine from home as he recovers.                                               Shan Levans, M.D. [facility]    PW/MEDQ  D:  10/26/2003  T:  10/27/2003  Job:  [id number]   cc:   Mikey Bussing, M.D.  [address]  [place]  [place] [address]   Jesse Sans. Wall, M.D.   Sean A. Everardo All, M.D. [facility]

## 2011-01-17 NOTE — Discharge Summary (Signed)
[name], [name]                            ACCOUNT NO.:  [id number]   MEDICAL RECORD NO.:  [id number]                   PATIENT TYPE:  INP   LOCATION:  2021                                 FACILITY:  [facility]   PHYSICIAN:  Salvatore Decent. Cornelius Moras, M.D.              DATE OF BIRTH:  04-29-1922   DATE OF ADMISSION:  10/25/2003  DATE OF DISCHARGE:  11/08/2003                                 DISCHARGE SUMMARY   PRIMARY ADMISSION DIAGNOSIS:  Shortness of breath.   DISCHARGE AND ADDITIONAL DIAGNOSES:  1. Aortic stenosis.  2. Coronary artery disease.  3. Persistent atrial fibrillation.  4. Hypertension.  5. Peripheral vascular disease.  6. Hyperlipidemia.  7. Obstructive sleep apnea on CPAP.  8. Cerebrovascular disease with no history of transient ischemic attack.  9. Postoperative atrial fibrillation and wide complex tachycardia.   PROCEDURES:  1. Cardiac catheterization.  2. Coronary artery bypass grafting x 3 (saphenous vein graft to the distal     LAD, saphenous vein graft to the circumflex marginal, saphenous vein     graft to the posterior descending coronary).  3. Aortic valve replacement with #25 mm Carpentier-Edwards Magnum     pericardial valve.  4. Modified Cox maze procedure.  5. Endoscopic vein harvest, left thigh to lower leg.   HISTORY:  The patient is a 77 year old male with known history of coronary  artery disease and aortic stenosis.  He has been followed by Dr. Nobie Putnam  and Dr. Daleen Squibb.  He has been followed on an annual basis with Cardiolite  studies as well as echocardiograms.  About one week prior to this admission,  he presented to Dr. Vern Claude office with progressive symptoms of exertional  shortness of breath.  He states that his symptoms have been bothering him  for six months to a year now and have seemed to worsen.  He has had no  episodes of resting shortness of breath, paroxysmal nocturnal dyspnea,  orthopnea, or chest pain.  He underwent a repeat stress  Cardiolite exam  which showed an ejection fraction of 48% with a small area of inferior and  apical wall ischemia which was different from his most recent previous  Cardiolite study.  In addition, he underwent a 2-D echocardiogram that  revealed progressive of his aortic stenosis which had previously been mild  to moderate.  He was noted to have peak and mean transvalvular gradients of  49 and 26 mmHg, respectively.  The calculated aortic valve area was 0.65 cm  squared.  Left ventricular function was reasonably well preserved, although  left ventricle was noted to be moderately dilated with mild concentric left  ventricular hypertrophy.  There was also mild dilatation of the left atrium  and mild regurgitation.  He has also recently been diagnosed with atrial  fibrillation which has been persistent.  Because of all these findings as  well as his new onset atrial fibrillation,  he was admitted to [facility] on October 25, 2003, for cardiac catheterization.   HOSPITAL COURSE:  He was admitted to [facility] on October 25, 2003, and  underwent cardiac catheterization by Dr. Gerri Spore which showed high-grade  90% stenosis of the mid right coronary artery with some ostial stenosis of  the left main coronary.  The pullback aortic valve gradient was estimated at  15 mmHg, although simultaneous gradient was not measured, and right heart  catheterization was not performed.  Left ventricular function was notable  for mild to moderate global hypokinesis with ejection fraction calculated at  42%.   Because of his progressive aortic stenosis, new onset atrial fibrillation,  and coronary artery disease, cardiothoracic surgery consultation was  obtained.  He was seen by Dr. Cornelius Moras, and it was agreed that proceeding with  surgery would be his best option.  After complete explanation of the risks,  benefits, and alternatives, the patient agreed to proceed.  Prior to  surgery, he did undergo a  consult by pulmonology.  He has a history of  obstructive sleep apnea and uses CPAP at bedtime; however, it was felt that  this was not a contraindication to proceeding with surgery.  It was felt  that he could be cleared to safely proceed provided he uses CPAP at night.  Also as part of his preoperative workup, he underwent a chest CT to evaluate  a questionable lung nodule.  The CT scan was benign and unchanged from a  previous scan which had been performed in 2002 which showed bilateral  pleural thickening with calcification typical for history of asbestos  exposure.  There was some focal thickening in the right lower lobe and  calcified granuloma in the superior segment of the left lower lobe.  He  remained stable and was able to be taken to the operating room on October 31, 2003, where he underwent CABG x 3 with aortic valve replacement and modified  Cox maze procedure, all described in detail above.   Intraoperatively, the left internal mammary artery was taken down but was  very poor quality and was not able to be used.  Also intraoperatively, no  left main coronary stenosis was seen.  He tolerated the procedure well and  was weaned from cardiopulmonary bypass in normal sinus rhythm.  At the  conclusion of the procedure, he was transferred to the SICU in stable  condition.  He was able to be extubated shortly after surgery.   He was hemodynamically stable and doing well on postop day #1.  At that  time, his hemodynamic monitoring devices and chest tubes were removed.  He  was started on Coumadin as well as diuresis and low-dose beta blocker.  He  remained in the unit for overnight observation.  By postop day #2, he was  ready for transfer to the floor.   He had an episode of recurrent atrial fibrillation on postop day #4, which  was rate controlled.  He was started on p.o. amiodarone and was continued on Lopressor and Coumadin.  He was also started on an ACE inhibitor.  He was   maintained on Lovenox until his Coumadin was therapeutic.  He had several  episodes of wide complex tachycardia which was nonsustained and  asymptomatic.  The magnesium value at that time was 2.1; potassium was 4.5.  He was continued on amiodarone and Lopressor as previously dosed.  He later  developed some bradycardia with a 2.8 second pause.  Because of this,  Lopressor was held, and his amiodarone dose was decreased.  Since that time,  his rhythm has remained stable.  He has been in rate-controlled atrial  fibrillation with rates in the 80s to 90s.   He is doing well otherwise.  He has been ambulating the halls without  difficulties.  He has been weaned from supplemental oxygen.  He is  maintaining O2 saturation greater than 90% on room air.  He has been  afebrile, and all other vital signs have been stable.  His surgical incision  sites are all healing well.  He has been tolerating a regular diet and is  having normal bowel and bladder function.  He has been diuresed back down to  within about 6 pounds of his preoperative weight.  Presently his INR is 2.3.  His other labs from November 07, 2003, show a hemoglobin of 9.6, hematocrit  28.6, platelets 278, white count 11.2.  Potassium 4.9, BUN 25, creatinine  1.4.  His most recent chest x-ray showed no evidence of pneumothorax.  There  were no other acute changes noted.  It is felt that if he continues to  remain stable over the next 24 hours and his rhythm remains stable, he will  hopefully be ready for discharge home by November 08, 2003.  Of note, a 2-D  echocardiogram will be performed late in the afternoon on November 07, 2003, and  hopefully the results will be available at the time of discharge to follow  up on his left ventricular function.   DISCHARGE MEDICATIONS:  1. Coumadin.  Home dose to be determined by PT and INR drawn on the day of     discharge.  2. Amiodarone 400 mg daily.  3. Lasix 40 mg daily x 1 week.  4. K-Dur 20 mEq daily x  1 week.  5. Accupril 20 mg daily.  6. Pravachol 40 mg daily.  7. Ultram 50 to 100 mg q.4h. p.r.n. pain.   DISCHARGE INSTRUCTIONS:  1. He is to refrain from driving, heavy lifting, or strenuous activity.  2. He may continue ambulating daily and using incentive spirometer.  3. He will continue low-fat, low-sodium diet.  4. He may shower daily and clean his incisions with soap and water.   FOLLOW UP:  1. Home health nursing has been arranged for cardiac surgery protocol as     well as to draw PT and INR on November 10, 2003. The results of this blood     work will be called to [facility] for further monitoring of     his anticoagulation.  2. He will follow up with Dr. Daleen Squibb in two weeks and will have a chest x-ray     at that visit.  3. He will return to the [facility] office for an appointment with Dr. Cornelius Moras on     December 04, 2003, at 1 o'clock p.m.  He is asked to bring his chest x-ray to    his appointment with Dr. Cornelius Moras to review.  In the interim, if he     experiences any problems or questions, he is asked to call our office     immediately.      Coral Ceo, P.A.                        Salvatore Decent. Cornelius Moras, M.D.    GC/MEDQ  D:  11/07/2003  T:  11/08/2003  Job:  [id number]  cc:   Patrica Duel, M.D.  [address]  [place]  [place] [address]  Fax: [phone]   Jesse Sans. Wall, M.D.

## 2011-01-17 NOTE — Assessment & Plan Note (Signed)
[facility]                         GASTROENTEROLOGY OFFICE NOTE   CORIAN, HANDLEY                         MRN:          [id number]  DATE:08/12/2006                            DOB:          [birth year]    The patient comes in on December 12 concerned about some solid food  dysphagia and says he has been on his Nexium intermittently and it helps  when he takes it.  He is also concerned about several knots in his neck  which he said have been checked; he had an ultrasound and he was told it  was not anything serious.  He has GERD, but no other GI symptoms.  His  family history for GI disease is negative.   PAST MEDICAL HISTORY:  1. Hypertension.  2. Hyperlipidemia.  3. Sleep apnea.  4. He has had heart valve surgery and bypass.   SOCIAL HISTORY:  Noncontributory.  He does not smoke or drink.   REVIEW OF SYSTEMS:  He has some joint pain, back pain, hearing  difficulty and some shortness of breath at times.   PHYSICAL EXAMINATION:  VITAL SIGNS:  Within normal limits.  His weight  is 200.  Blood pressure is 100/58, pulse 65 and regular.  NECK:  Unremarkable.  HEART:  Unremarkable except for a slight systolic murmur at the left  sternal border.  ABDOMEN:  Soft.  No mass or organomegaly.  No bruits or rubs.  Nontender.  EXTREMITIES:  Unremarkable.  RECTAL:  Deferred.   IMPRESSION:  1. Gastroesophageal reflux disease.  2. Arteriosclerotic cardiovascular disease, status post bypass.  3. Aortic stenosis and aortic valve replacement.  4. History of cerebrovascular accidents.  5. Hypertension.  6. Dyslipidemia.  7. Sleep apnea.  8. Peripheral vascular disease.   MEDICATIONS:  Norvasc, Toprol, lisinopril, Zetia, Pravastatin, aspirin,  CPAP, multivitamin, amlodipine.   ASSESSMENT AND PLAN:  It should be noted that his last upper endoscopy  revealed a 4-cm hiatal hernia with 80% obstructing stricture which we  dilated with a 54-mm dilator and  when I mentioned it to him to have this  done again, he said that he would like to go a little bit longer to see  if he continued to have his difficulty and I certainly agree with this.  I told him that as far as I was concerned, that it is always better to  wait, that he should stay on his Nexium and if he continues to having  difficulty, we can always do the procedure with the dilatation.  He said  they only lasted for a short period of time anyway.  I told him to  continue on his Nexium; we gave him some samples and hopefully he will  do fine.     Ulyess Mort, MD  Electronically Signed    SML/MedQ  DD: 08/13/2006  DT: 08/14/2006  Job #: [id number]   cc:   Kirk Ruths, M.D.

## 2011-01-17 NOTE — Progress Notes (Signed)
[facility]                          PERIPHERAL VASCULAR OFFICE NOTE   DHILAN, BRAUER                         MRN:          [id number]  DATE:03/12/2006                            DOB:          03/13/1922    HISTORY OF PRESENT ILLNESS:  Mr. [name] is an 77 year old gentleman with  bilateral lower extremity claudication which has been longstanding.  He is  status post aortobifemoral bypass grafting and has longstanding superficial  femoral arterial disease bilaterally.  Over the past six months or so, he  has seen some worsening in his claudication threshold.  Now, he can only  walk about 50 yards uphill out of his house before he develops significant  calf, ankle, and foot pain bilaterally.  He says that both sides are equal.  He has not had any rest pain or tissue loss.   We obtained repeat ankle brachial indices and duplex studies two days ago.  On the right, his ABI is stable at 0.68.  On the left, his ABI has dropped  from 0.69 to 0.52 since April, 2006.  The aortobifemoral bypass grafts  appear patent.  On the left, there is a focal stenosis of the mid SFA with  peak systolic velocity of 491 cm.  There is a cauliflower-like lesion, which  is substantially calcified, that causes this stenosis.   CURRENT MEDICATIONS:  1.  Norvasc 10 mg per day.  2.  Toprol XL 25 mg per day.  3.  Lisinopril 40 mg per day.  4.  Zetia 10 mg per day.  5.  Pravastatin 40 mg per day.  6.  Aspirin 81 mg per day.  7.  Multivitamin.  8.  CPAP.   PHYSICAL EXAMINATION:  VITAL SIGNS:  Heart rate 76, blood pressure 128/82  and equal bilaterally.  Weight is 203 pounds.  GENERAL:  He is generally well-appearing in no distress.  HEENT:  NECK:  He has no jugular venous distention and no thyromegaly.  LUNGS:  Clear to auscultation.  HEART:  He has a nondisplaced point of maximal cardiac impulse.  There is a  regular rate and rhythm without murmur, rub, or gallop.  ABDOMEN:  Soft, nondistended, nontender.  There is no hepatosplenomegaly.  Bowel sounds are normal.  EXTREMITIES:  Warm without clubbing, cyanosis, edema, or ulceration.  Femoral pulses are 2+ bilaterally.  Popliteal, DP, and PT pulses are not  palpable on either side.   ABIs and duplex study as detailed above.   IMPRESSION/PLAN:  1.  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  Symptoms have progressed on that      side, despite the absence of any change in his ABI.  I think this says      that much of his deterioration is related to change in his activity.  On      the left, he clearly has had worsening of a focal stenosis.  The      cauliflower-like nature of this stenosis increases the chance of distal  embolization with percutaneous therapy.  I therefore would like try very      hard with conservative management.  He is amenable to this.  I have      reoutlined to him the walking program that he will begin on his      treadmill.  I will initiate Pletal as well.  2.  While he does have a history of atrial fibrillation, he is status post a      Cox Maze procedure.  I think the likelihood that the Pletal will cause a      recurrence of the atrial fibrillation is low and outweighed by the      potential symptomatic benefit.  3.  Hypertension, hypercholesterolemia, coronary disease:  All managed by      Drs. Wall and Cresenzo.                                   Salvadore Farber, MD   WED/MedQ  DD:  03/12/2006  DT:  03/12/2006  Job #:  [id number]   cc:   Thomas C. Daleen Squibb, MD, [facility]  Patrica Duel, MD

## 2011-01-17 NOTE — Consult Note (Signed)
NAMESYLER, NORCIA                            ACCOUNT NO.:  [id number]   MEDICAL RECORD NO.:  [id number]                   PATIENT TYPE:  OIB   LOCATION:  2025                                 FACILITY:  [facility]   PHYSICIAN:  Salvatore Decent. Cornelius Moras, M.D.              DATE OF BIRTH:  [birth year]   DATE OF CONSULTATION:  10/26/2003  DATE OF DISCHARGE:                                   CONSULTATION   REQUESTING PHYSICIAN:  Carole Binning, M.D.   PRIMARY CARDIOLOGIST:  Jesse Sans. Wall, M.D.   PRIMARY CARE PHYSICIAN:  Patrica Duel, M.D.   REASON FOR CONSULTATION:  Aortic stenosis, coronary artery disease, atrial  fibrillation.   HISTORY OF PRESENT ILLNESS:  Mr. Knotts is a 77 year old retired white male  from [place] who has been followed by Dr. Nobie Putnam and Dr. Daleen Squibb with  known history of coronary artery disease, mild aortic stenosis,  hypertension, and peripheral vascular disease. He has been returning to see  Dr. Daleen Squibb on an annual basis, undergoing followup stress Cardiolite exams and  echocardiograms periodically. He returned to see Dr. Daleen Squibb last week with  progressive symptoms of exertional shortness of breath. He describes having  a six month to one year history of progressive symptoms of shortness of  breath with activity. His shortness of breath did not come on with mild  activity, but it has continued to worsen over recent months such that it  effects his daily lifestyle. He denies any episodes of resting shortness of  breath, PND, orthopnea, or lower extremity edema. After followup exam by Dr.  Daleen Squibb, the patient underwent repeat stress Cardiolite exam demonstrating  ejection fraction of 48% with small area of inferior and apical wall  ischemia different from his most recent previous Cardiolite exam. In  addition, he underwent a 2-D echocardiogram that revealed progression of his  aortic stenosis. This was performed as an outpatient on February 21 and  notable for peak and mean  transvalvular gradient of 49 and 26 mmHg  respectively. The calculated aortic valve area was 0.65 sq cm. Left  ventricular function was reasonably well preserved although left ventricular  was noted to be moderately dilated with mild concentric left ventricular  hypertrophy. There was also mild dilatation of the left atrium and mild  regurgitation. The patient has also been discovered to have developed  persistent atrial fibrillation. Subsequently, the patient was admitted to  the hospital for anticoagulation and elective cardiac catheterization. He  underwent left catheterization by Dr. Gerri Spore yesterday. This demonstrates  high grade 90% stenosis of the mid right coronary artery with some ostial  stenosis of the left main coronary artery. Pull-back aortic valve gradient  was estimated 15 mmHg, although simultaneous gradient was not measured, and  right heart catheterization was not performed. Left ventricular function was  notable for mild to moderate global hypokinesis with ejection fraction  calculated 42%. Cardiac surgical consultation  was requested.   REVIEW OF SYSTEMS:  GENERAL:  The patient reports feeling well with  exception of slow progression of exertional shortness of breath. The patient  has good appetite, has not been gaining or loosing weight. CARDIAC:  Notable  for the absence of any symptoms of substernal chest pain or chest tightness.  The patient does occasionally get some mild pain in his neck with activity.  He has progressive exertional shortness of breath and specifically denies  resting shortness of breath, PND, orthopnea, or lower extremity edema. The  patient denies any dizzy spells or syncopal episodes. RESPIRATORY:  Notable  for the absence of any productive cough, hemoptysis, wheezing.  GASTROINTESTINAL:  Notable for occasional difficulty with foot getting stuck  when he swallows. He denies any pain with swallowing, either solid food or  liquids. The patient  has a history of esophageal stricture and hiatal hernia  in the past. The patient denies any hematochezia, hematemesis, melena.  MUSCULOSKELETAL:  Notable for mild chronic problems of the neck and lower  back as well as mild pain in his knees, particularly the left knee.  NEUROLOGICAL:  Negative. The patient denies symptoms suggestive of previous  TIA or stroke. PERIPHERAL VASCULAR:  Notable for the absence of ongoing  symptoms of claudication. The patient denies problems with deep vein  thrombosis or venous insufficiency. HEMATOLOGIC:  Negative. The patient  denies bleeding diaphysis. ENDOCRINE:  Negative. The patient denies symptoms  suggestive of diabetes. GENITOURINARY:  Negative. The patient denies urinary  urgency or frequency. HEENT:  Negative. The patient sees his dentist  regularly and follows routine dental prophylaxis for abnormal heart valve.   PAST MEDICAL HISTORY:  1. Coronary artery disease, status post PTCA and stenting of the right     coronary artery in 1995.  2. Aortic stenosis which has progressed over serial outpatient     echocardiogram exams.  3. Peripheral vascular disease.  4. Hypertension.  5. Hyperlipidemia.  6. Obstructive sleep apnea.  7. New onset persistent atrial fibrillation.  8. Cerebrovascular disease with atherosclerotic plaque involving both     carotid arteries with no significant stenosis and no history of stroke or     TIA.   PAST SURGICAL HISTORY:  1. Resection of grafting of abdominal aortic aneurysm in 1992 with     aortobifemoral bypass by Dr. Arbie Cookey.  2. The patient underwent cataract surgery in 77  3. Underwent left knee arthroscopy in 2004.  4. Also underwent appendectomy in his childhood.   FAMILY HISTORY:  Noncontributory.   SOCIAL HISTORY:  The patient is retired and lives with his wife in  [place]. He remains quite active physically. He has remote history of tobacco use although he quit smoking 40 years ago. They have two  grown  children who are very supportive. He denies history of excessive alcohol  consumption.   MEDICATIONS PRIOR TO ADMISSION:  Aspirin, Pravachol, Norvasc, Accupril.   DRUG ALLERGIES:  None known.   PHYSICAL EXAMINATION:  GENERAL:  Notable for a well appearing, mildly obese  white male who appears his stated age in no acute distress.  VITAL SIGNS:  He is currently afebrile and normotensive, and he is in atrial  fibrillation with controlled ventricular response.  HEENT:  Essentially unrevealing.  NECK:  Supple. There is no cervical or supraclavicular lymphadenopathy.  There is no jugular venous distention.  CHEST:  Auscultation of the chest reveals clear breath sounds bilaterally.  No wheezes or rhonchi are noted.  CARDIOVASCULAR:  Irregular  heart rhythm. There is a grade 2/6 systolic  murmur heard all across the precordium which is crescendo/decrescendo. No  diastolic murmurs are noted.  ABDOMEN:  The abdomen is soft and nontender. There are no palpable masses.  Bowel sounds are present.  EXTREMITIES:  The extremities are warm and well perfused. There is no lower  extremity edema. Distal pulses are not palpable in either lower leg at the  ankle.  RECTAL:  Deferred.  GENITOURINARY:  Deferred.  NEUROLOGICAL:  Grossly nonfocal and symmetrical throughout.   DIAGNOSTIC TESTS:  Cardiac catheterization performed by Dr. Gerri Spore has  been reviewed and results are as described previously. There is high grade  stenosis of the mid right coronary artery with what appears to be unstable  plaque, perhaps thrombus. There is some ostial stenosis of the left main  coronary artery although I am not completely convinced this is a  hemodynamically significant lesion. There certainly is ostial plaque, and if  surgical intervention were planned, I would plan bypass graft to the LAD and  left circumflex territory due to this ostial left main stenosis; however, I  do not believe this is clinically  active from the standpoint of the  patient's underlying symptomatology. The patient has at least moderate to  severe aortic stenosis. Unfortunately, right heart catheterization and  formal assessment of the severity of aortic stenosis was not performed at  the time of catheterization yesterday. Nevertheless, based upon the  patient's progression of symptoms and his recent 2-D echocardiogram, I  believe aortic stenosis with recent development of atrial fibrillation  probably remains the primary culprit to the patient's worsening exertional  shortness of breath and fatigue. Options at this juncture include  percutaneous coronary intervention of the right coronary artery lesion with  continued medical therapy for his aortic stenosis and atrial fibrillation.  Alternatively, we could proceed with surgery for aortic valve replacement,  coronary artery bypass grafting, and possible modified Cox-Maze procedure for definitive treatment of atrial fibrillation.   PLAN:  I have outlined options at length with Mr. Snapp and his family.  Alternative treatment strategies have been reviewed in detail. All other  questions have been addressed. They understand and accept all associated  risks of the surgery including but not limited to risk of death, stroke,  myocardial infarction, congestive heart failure, respiratory failure,  pneumonia, bleeding requiring blood transfusion, arrhythmia, heart block or  bradycardia requiring permanent pacemaker, recurrent coronary artery  disease, prosthetic valve related complications, recurrence of atrial  fibrillation. The relative risks and benefits of concomitant ablation  procedure for treatment of atrial fibrillation have been discussed. Mr.  Gibbon desires to proceed with surgery for a most definitive means of  treating all of his underlying problems. We plan to proceed with surgery  early next week. In the meantime, we will obtain formal pulmonary function   tests and a barium swallow to rule out the significance of possible lower  esophageal stricture with the patient's history of recent dysphagia.                                               Salvatore Decent. Cornelius Moras, M.D.    CHO/MEDQ  D:  10/26/2003  T:  10/27/2003  Job:  [id number]   cc:   Thomas C. Daleen Squibb, M.D.   Patrica Duel, M.D.  [address]  [place]  [place] [address]  Fax: [phone]   Shan Levans, M.D. [facility]

## 2011-01-17 NOTE — Assessment & Plan Note (Signed)
[name]                             PULMONARY OFFICE NOTE   [name]                         MRN:          [id number]  DATE:09/21/2006                            DOB:          09-13-1921    [name] returns in followup for obstructive sleep apnea.  He has a  __________ syndrome and obstructive sleep apnea and is on CPAP with a  pressure of 12 cm via nasal mask.  Dr. Samule Walsh had determined the patient  was complaining of some neck pain and was concerned about  lymphadenopathy in the neck.  He did undergo a CT scan of the neck that  was performed on September 11, 2006.  Results of this showed no evidence  of neck mass, normal sized lymph nodes; nothing that would be expected  to be symptomatic was noted.  The patient does complain of bilateral  neck pain just below the jaw.   MEDICATIONS:  Maintenance medicines include:  1. Amlodipine 10 mg daily.  2. Metoprolol 25 mg daily.  3. Lisinopril 40 mg daily.  4. Zetia 10 mg daily.  5. Pravastatin 40 mg daily.  6. Aspirin 81 mg daily.  7. Nexium 40 mg daily.   PHYSICAL EXAMINATION:  VITAL SIGNS:  Temperature was 97, blood pressure  130/70, pulse 78, saturation 97% on room air.  CHEST:  Distant breath sounds.  No wheeze or rhonchi were noted.  CARDIAC:  Regular rate and rhythm without S3.  Normal S1 and S2.  ABDOMEN:  Soft, nontender.  EXTREMITIES:  No edema or clubbing.  SKIN:  Clear.  NEUROLOGIC:  Intact.  HEENT:  Did not reveal evidence for lymphadenopathy in the neck area.  There was no evidence of any pathology seen.  There was mild tenderness  below the jaw bilaterally.   IMPRESSION:  Bilateral neck tenderness without evidence of overt  pathology on CT scan of the neck.  The patient would like an opinion  with otolaryngology in this regard.  We will refer him to Dr. Suzanna Walsh  of [facility].  He had previously seen Dr. Carlena Walsh for his  sleep  apnea, but this physician  has left the practice in [place].  Will see  this patient back for his CPAP therapy which will be maintained at its  current dosage in 6 months.     Joshua Cradle Delford Field, MD, [facility]  Electronically Signed    PEW/MedQ  DD: 09/21/2006  DT: 09/21/2006  Job #: [id number]   cc:   Joshua Farber, MD  [name], M.D.

## 2011-01-17 NOTE — Cardiovascular Report (Signed)
NAME:  [name], [name]                            ACCOUNT NO.:  [id number]   MEDICAL RECORD NO.:  [id number]                   PATIENT TYPE:  OIB   LOCATION:  2899                                 FACILITY:  [facility]   PHYSICIAN:  Carole Binning, M.D. [facility]         DATE OF BIRTH:  17-Dec-1921   DATE OF PROCEDURE:  10/25/2003  DATE OF DISCHARGE:                              CARDIAC CATHETERIZATION   PROCEDURE PERFORMED:  Left heart catheterization with coronary angiography,  left ventriculography, aortic root angiography, and abdominal aortography.   INDICATION:  Mr. Marsolek is a 77 year old male with a history of peripheral  vascular disease and coronary artery disease.  He has had progressive  exertional dyspnea.  He also has a history of aortic stenosis.  He has new  onset atrial fibrillation.  A recent stress Cardiolite in the office showed  evidence of inferoapical ischemia.  He was therefore referred for cardiac  catheterization to assess his coronary anatomy.  Of note, an echocardiogram  obtained in the office showed at least moderate aortic stenosis with a mean  aortic gradient of 26 mmHg, peak of 50 mmHg.   PROCEDURAL NOTE:  A 6-French sheath was placed in the right femoral artery.  Coronary angiography was performed with 6-French JL-5 and JR-4 catheters.  Left ventriculography, abdominal aortography and aortic root angiography  were performed with an angled pigtail catheter.  Contrast was Omnipaque.  There were no complications.   CATHETERIZATION RESULTS:  1. HEMODYNAMICS:  Left ventricular pressure 151/20, aortic pressure 130/72.     The mean gradient on pullback across the aortic valve was approximately     15 mmHg.  2. LEFT VENTRICULOGRAM:  Reveals mild to moderate global hypokinesis of the     left ventricle.  Ejection fraction is calculated at 42%.  There is a     question of trace mitral regurgitation.  3. AORTIC ROOT ANGIOGRAPHY:  Reveals a calcified aortic valve with  decreased     mobility and trace aortic insufficiency.  4. ABDOMINAL AORTOGRAM:  Reveals patent aortobifemoral bypass graft.  The     renal arteries are patent.  5. CORONARY ARTERIOGRAPHY (RIGHT DOMINANT):     A. LEFT MAIN:  Has a very eccentric 80% stenosis at its ostium.     B. LEFT ANTERIOR DESCENDING ARTERY:  Has a diffuse 30% stenosis extending        from the proximal to mid vessel.  The LAD gives rise to two normal        size diagonal branches.     C. LEFT CIRCUMFLEX:  Gives rise to a small first marginal and a large        second marginal.  There is also a very small third marginal branch.        There is a diffuse 20% stenosis in the mid circumflex extending into        the proximal portion of  the second obtuse marginal branch.     D. RIGHT CORONARY ARTERY:  A dominant vessel.  It is very heavily        calcified throughout.  There is a diffuse 50% stenosis in the proximal        vessel.  The mid vessel has a focal 90% stenosis with a discrete        filling defect with the appearance consistent with thrombus.  The        distal vessel has a 40% stenosis.  The distal right coronary artery        gives rise to a large posterior descending artery, normal size first        posterolateral branch and a small second posterolateral branch.   IMPRESSIONS:  1. Mild to moderate decrease in left ventricular systolic function with     probable moderate aortic stenosis.  2. Three vessel coronary artery disease characterized by 80% stenosis in the     ostium of the left main and a 90% stenosis in the mid right coronary     artery with the appearance of thrombus.   PLAN:  The patient will be admitted and started on intravenous heparin.  He  will be referred for coronary bypass surgery and aortic valve replacement.                                               Carole Binning, M.D. [facility]    MWP/MEDQ  D:  10/25/2003  T:  10/26/2003  Job:  [id number]   cc:   Gregary Signs A. Everardo All, M.D. [facility]    Maisie Fus C. Wall, M.D.   CARDIAC CATH LAB

## 2011-02-03 ENCOUNTER — Ambulatory Visit (INDEPENDENT_AMBULATORY_CARE_PROVIDER_SITE_OTHER): Payer: Medicare Other | Admitting: *Deleted

## 2011-02-03 DIAGNOSIS — I4891 Unspecified atrial fibrillation: Secondary | ICD-10-CM

## 2011-02-14 ENCOUNTER — Encounter: Payer: Self-pay | Admitting: Family Medicine

## 2011-02-14 ENCOUNTER — Ambulatory Visit (INDEPENDENT_AMBULATORY_CARE_PROVIDER_SITE_OTHER): Payer: Medicare Other | Admitting: Family Medicine

## 2011-02-14 VITALS — BP 120/68 | Temp 98.0°F | Wt 178.0 lb

## 2011-02-14 DIAGNOSIS — I10 Essential (primary) hypertension: Secondary | ICD-10-CM

## 2011-02-14 DIAGNOSIS — R609 Edema, unspecified: Secondary | ICD-10-CM

## 2011-02-14 DIAGNOSIS — G4733 Obstructive sleep apnea (adult) (pediatric): Secondary | ICD-10-CM

## 2011-02-14 DIAGNOSIS — I4891 Unspecified atrial fibrillation: Secondary | ICD-10-CM

## 2011-02-14 NOTE — Progress Notes (Signed)
Subjective:    Patient ID: [name], male    DOB: [birth year], 77 y.o.   MRN: [id number]  HPI Patient has history of hyperlipidemia, obstructive sleep apnea, CAD, hypertension, aortic stenosis, atrial fibrillation, cerebrovascular disease, peripheral vascular disease, COPD, and cognitive impairment. Seen for followup. Recent edema issues. Reduce amlodipine to 5 mg and blood pressures been stable. No orthostasis. Weight down 7 pounds from last visit and less edema. Remains on furosemide 40 mg daily. Good appetite and eating well.  Daughter states he falls asleep easily during the day but also seems to be sleeping fairly well at night. Recent TSH normal.  No sedating meds used during day.  No recent falls but does seem unsteady at times. Medications reviewed. No recent changes. On chronic Coumadin. No bleeding complications. Request INR checks here secondary to cost saving  Past Medical History  Diagnosis Date  . Encounter for long-term (current) use of anticoagulants 12/09/2010  . Hypoxemia 05/15/2009  . Atrial fibrillation 01/19/2009  . CORONARY ARTERY DISEASE 10/07/2007  . AORTIC STENOSIS, SEVERE 10/07/2007  . PVD 10/07/2007  . HYPERLIPIDEMIA 10/07/2007  . HYPERTENSION 10/07/2007  . CEREBROVASCULAR DISEASE 10/07/2007  . DYSPNEA 02/14/2008  . OBSTRUCTIVE SLEEP APNEA 10/07/2007  . Asbestosis 02/21/2008  . History of colon cancer   . DDD (degenerative disc disease)     low back  . Colon polyp    Past Surgical History  Procedure Date  . Abdominal aortic aneurysm repair 1992  . Coronary artery bypass graft 2005  . Aortic valve replacement 2005  . Knee surgery     left    reports that he quit smoking about 37 years ago. His smoking use included Cigarettes. He smoked 1 pack per day for 0 years. He does not have any smokeless tobacco history on file. He reports that he does not drink alcohol or use illicit drugs. family history includes COPD in an unspecified family member; Colon cancer in an  unspecified family member; Coronary artery disease in an unspecified family member; Heart disease in his father and unspecified family member; Lung cancer in an unspecified family member; and Stroke (age of onset:61) in his mother. Allergies  Allergen Reactions  . Morphine     REACTION: unk      Review of Systems  Constitutional: Negative for fever.  HENT: Positive for hearing loss (chronic).   Respiratory: Negative for cough.   Cardiovascular: Negative for chest pain, palpitations and leg swelling.  Gastrointestinal: Negative for abdominal pain.  Genitourinary: Negative for dysuria and difficulty urinating.  Neurological: Negative for syncope and headaches.       Objective:   Physical Exam  Constitutional:       Patient is alert hard of hearing  HENT:  Mouth/Throat: Oropharynx is clear and moist.  Neck: Neck supple.  Cardiovascular:       Irregularly irregular rhythm rate controlled  Pulmonary/Chest: Breath sounds normal. No respiratory distress. He has no wheezes. He has no rales.  Musculoskeletal:       Edema greatly improved. No pitting edema at this time  Neurological: He is alert.  Skin: No rash noted.  Psychiatric: He has a normal mood and affect.          Assessment & Plan:  #1 peripheral edema improved with reduced dose amlodipine #2 hypertension stable continue current medications #3 memory impairment. Discussed possible use of medication such as Aricept. Given potential side effects observation for now #4 atrial fibrillation with chronic Coumadin. They requesting INR here (  secondary to cost issues) and we will set up for end of month

## 2011-02-24 ENCOUNTER — Other Ambulatory Visit (INDEPENDENT_AMBULATORY_CARE_PROVIDER_SITE_OTHER): Payer: Medicare Other

## 2011-02-24 ENCOUNTER — Encounter: Payer: Medicare Other | Admitting: *Deleted

## 2011-02-24 DIAGNOSIS — I4891 Unspecified atrial fibrillation: Secondary | ICD-10-CM

## 2011-02-24 NOTE — Patient Instructions (Addendum)
2.Check in 2 weeks5mg . Monday-Thursday and Friday-Sunday take 5mg .

## 2011-02-26 ENCOUNTER — Ambulatory Visit: Payer: Medicare Other | Admitting: Critical Care Medicine

## 2011-02-26 ENCOUNTER — Encounter: Payer: Self-pay | Admitting: Critical Care Medicine

## 2011-02-28 ENCOUNTER — Ambulatory Visit (INDEPENDENT_AMBULATORY_CARE_PROVIDER_SITE_OTHER)
Admission: RE | Admit: 2011-02-28 | Discharge: 2011-02-28 | Disposition: A | Discharge status: Home / Self Care | Payer: Medicare Other | Source: Ambulatory Visit | Attending: Critical Care Medicine | Admitting: Critical Care Medicine

## 2011-02-28 ENCOUNTER — Encounter: Payer: Self-pay | Admitting: Critical Care Medicine

## 2011-02-28 ENCOUNTER — Ambulatory Visit (INDEPENDENT_AMBULATORY_CARE_PROVIDER_SITE_OTHER): Payer: Medicare Other | Admitting: Critical Care Medicine

## 2011-02-28 DIAGNOSIS — J4489 Other specified chronic obstructive pulmonary disease: Secondary | ICD-10-CM

## 2011-02-28 DIAGNOSIS — J61 Pneumoconiosis due to asbestos and other mineral fibers: Secondary | ICD-10-CM

## 2011-02-28 DIAGNOSIS — J449 Chronic obstructive pulmonary disease, unspecified: Secondary | ICD-10-CM

## 2011-02-28 DIAGNOSIS — G4733 Obstructive sleep apnea (adult) (pediatric): Secondary | ICD-10-CM

## 2011-02-28 NOTE — Patient Instructions (Signed)
A cpap titration study will be obtained with [facility] A chest xray will be obtained and sent to lawyer office No change in medications Return 6 months

## 2011-02-28 NOTE — Progress Notes (Signed)
Subjective:    Patient ID: [name], male    DOB: 04-23-1922, 77 y.o.   MRN: [id number]  HPI 77 yo WM history of obstructive sleep apnea, asbestosis, asbestos pleural disease.  PFTs have shown severe restrictive lung disease.   June 18, 2010 11:47 AM  The pt started brovana and budesonide and this has helped significantly.  The pt is now off oxygen. DYspnea is much better. No new issues.   October 03, 2010 2:15 PM  f/u copd, pt is getting more forgetful. Gets up confused in the am.  No new issues on breathing   02/28/2011 Gradually getting worse over time.  Confusion is worse.  Neb med helps better Is slow to get up in the AM and fall asleep easily No real edema.  Weight is less   Review of Systems Constitutional:   Notes   weight  No , night sweats,  Fevers, chills, fatigue, lassitude. HEENT:   No headaches,  Difficulty swallowing,  Tooth/dental problems,  Sore throat,                No sneezing, itching, ear ache, nasal congestion, post nasal drip,   CV:  No chest pain,  Orthopnea, PND, swelling in lower extremities, anasarca, dizziness, palpitations  GI  No heartburn, indigestion, abdominal pain, nausea, vomiting, diarrhea, change in bowel habits, loss of appetite  Resp: Notes  shortness of breath with exertion none  at rest.  No excess mucus, no productive cough,  No non-productive cough,  No coughing up of blood.  No change in color of mucus.  No wheezing.  No chest wall deformity  Skin: no rash or lesions.  GU: no dysuria, change in color of urine, no urgency or frequency.  No flank pain.  MS:  No joint pain or swelling.  No decreased range of motion.  No back pain.  Psych:  No change in mood or affect. No depression or anxiety.  No memory loss.     Objective:   Physical Exam Filed Vitals:   02/28/11 1545  BP: 118/60  Pulse: 60  Temp: 98.1 F (36.7 C)  TempSrc: Oral  Height: 5\' 7"  (1.702 m)  Weight: 176 lb 6.4 oz (80.015 kg)  SpO2: 100%    Gen:  Pleasant, well-nourished, in no distress,  normal affect  ENT: No lesions,  mouth clear,  oropharynx clear, no postnasal drip  Neck: No JVD, no TMG, no carotid bruits  Lungs: No use of accessory muscles, no dullness to percussion, distant BS Cardiovascular: RRR, heart sounds normal, no murmur or gallops, no peripheral edema  Abdomen: soft and NT, no HSM,  BS normal  Musculoskeletal: No deformities, no cyanosis or clubbing  Neuro: alert, non focal  Skin: Warm, no lesions or rashes       Assessment & Plan:   Asbestosis Asbestosis  Plan CXR stable,  Will be sent to attorney office    COPD chk ono     Updated Medication List Outpatient Encounter Prescriptions as of 02/28/2011  Medication Sig Dispense Refill  . amLODipine (NORVASC) 5 MG tablet Take 1 tablet (5 mg total) by mouth daily.  90 tablet  3  . arformoterol (BROVANA) 15 MCG/2ML NEBU Take 15 mcg by nebulization 2 (two) times daily.        Marland Kitchen aspirin 81 MG tablet Take 81 mg by mouth daily.        . budesonide (PULMICORT) 0.25 MG/2ML nebulizer solution Take 0.25 mg by nebulization 2 (two) times  daily.        . esomeprazole (NEXIUM) 40 MG capsule Take 40 mg by mouth daily before breakfast.        . furosemide (LASIX) 40 MG tablet Take 40 mg by mouth every other day.        . lisinopril (PRINIVIL,ZESTRIL) 40 MG tablet Take 40 mg by mouth daily.        . metoprolol (TOPROL-XL) 50 MG 24 hr tablet Take 50 mg by mouth daily.        . Multiple Vitamin (MULTIVITAMIN) tablet Take 1 tablet by mouth daily.        . NON FORMULARY CPAP and O2 2 liters at bedtime, sleeping  O2 4 liters nasal canula with activity       . pravastatin (PRAVACHOL) 40 MG tablet Take 40 mg by mouth daily.        Marland Kitchen warfarin (COUMADIN) 5 MG tablet Take by mouth as directed.        Marland Kitchen DISCONTD: furosemide (LASIX) 40 MG tablet Take 1 tablet (40 mg total) by mouth daily.  30 tablet  11  . potassium chloride SA (K-DUR,KLOR-CON) 20 MEQ tablet Take 1 tablet (20  mEq total) by mouth daily.  30 tablet  11  . DISCONTD: ALPRAZolam (XANAX) 0.5 MG tablet Take 0.5 mg by mouth at bedtime as needed.

## 2011-03-02 NOTE — Assessment & Plan Note (Signed)
chk ono

## 2011-03-02 NOTE — Assessment & Plan Note (Signed)
Asbestosis  Plan CXR stable,  Will be sent to attorney office

## 2011-03-03 ENCOUNTER — Telehealth: Payer: Self-pay | Admitting: Critical Care Medicine

## 2011-03-03 NOTE — Progress Notes (Signed)
Quick Note:  Called, spoke with pt's wife. She was informed CXR is stable per PW and we will send copy on disc to [employer]. She verbalized understanding, stated she would inform pt of this, and voiced no further concerns at this time. ______

## 2011-03-03 NOTE — Telephone Encounter (Signed)
Will forward to PCC's so sleep study may be faxed to DME company if this has not been done.

## 2011-03-04 NOTE — Progress Notes (Signed)
Quick Note:  Spoke with Dena in Medical Records. CXR on Disc was mailed to [facility] today. ______

## 2011-03-06 ENCOUNTER — Other Ambulatory Visit: Payer: Self-pay | Admitting: Critical Care Medicine

## 2011-03-06 ENCOUNTER — Telehealth: Payer: Self-pay | Admitting: Critical Care Medicine

## 2011-03-06 DIAGNOSIS — G4730 Sleep apnea, unspecified: Secondary | ICD-10-CM

## 2011-03-06 DIAGNOSIS — G4733 Obstructive sleep apnea (adult) (pediatric): Secondary | ICD-10-CM

## 2011-03-06 NOTE — Telephone Encounter (Signed)
Spoke with Tammy at [facility] and she states she needs 3 things from Dr. Delford Field: 1) An order for a new cpap machine at 5-20cm  2) Copy of pt sleep study, I do not see this in the system and,   3) She needs the pt OV note to state that the pt "continues to use the cpap and benefits from the use of the cpap." I advised I will send a message to Dr. Delford Field with these requests. Carron Curie, CMA

## 2011-03-06 NOTE — Telephone Encounter (Signed)
See encounter and orders pls route to pcc

## 2011-03-06 NOTE — Telephone Encounter (Signed)
[name] FROM [facility] IN [place] CALLED BACK TO ADD THE FOLLOWING: PLEASE ASK DR Delford Field TO DOCUMENT ON OV NOTES THAT " PT CONTINUES TO USE AND BENEFITS FROM CPAP DEVICE". [name]

## 2011-03-06 NOTE — Telephone Encounter (Signed)
pt continues to use the cpap and benefits from the use of the cpap  Pt to receive cpap 5-20  Pt had sleep study many years ago, I believe at [facility].  Lookup sleep study in echart or call sleep center for old study

## 2011-03-06 NOTE — Progress Notes (Signed)
Pt needs a new cpap machine Pt is aware ONO on 2L plus cpap showed sats 99%, no change in therapy needed

## 2011-03-06 NOTE — Telephone Encounter (Signed)
Order faxed to [facility] to get pt new cpap

## 2011-03-12 ENCOUNTER — Encounter: Payer: Self-pay | Admitting: Critical Care Medicine

## 2011-03-12 ENCOUNTER — Ambulatory Visit (INDEPENDENT_AMBULATORY_CARE_PROVIDER_SITE_OTHER): Payer: Medicare Other | Admitting: Family Medicine

## 2011-03-12 ENCOUNTER — Telehealth: Payer: Self-pay | Admitting: Critical Care Medicine

## 2011-03-12 DIAGNOSIS — I4891 Unspecified atrial fibrillation: Secondary | ICD-10-CM

## 2011-03-12 DIAGNOSIS — Z9989 Dependence on other enabling machines and devices: Secondary | ICD-10-CM

## 2011-03-12 DIAGNOSIS — Z7901 Long term (current) use of anticoagulants: Secondary | ICD-10-CM

## 2011-03-12 NOTE — Telephone Encounter (Signed)
This pt needs a new cpap machine , based on autoset download,  Needs 18cmh20  Will need gentle ramp up Pt uses [facility].  Pt is too frail to come in for another sleep study Try to get machine without new study

## 2011-03-12 NOTE — Telephone Encounter (Signed)
tammy called back. She says pt wants order for cpap to read "5 -20". Caller says order also needs to state that pt continues to use AND benefits from cpap. Also needs a heated humidifier and supplies. Fax # [phone]. Contact # is [phone] (I have verified ea. # to make sure these weren't transposed). Hazel Sams

## 2011-03-12 NOTE — Progress Notes (Signed)
Pt continues to use CPAP and benefits from this use.

## 2011-03-12 NOTE — Patient Instructions (Addendum)
2.Check in 2 weeks5mg . Mon. Wed. Fri. All other days take 5mg .

## 2011-03-12 NOTE — Telephone Encounter (Signed)
Dr. Delford Field, sorry for the confusion but I spoke with Tammy at [facility] and she states that the patient is requesting to keep his cpap on auto at 5-20 setting because he feels more comfortable at this setting. Tammy states that insurance will cover Cpap at this setting. Pt also needs humidifier and supplies. Please advise if ok to have pt cpap set at 5-20. Thanks. Carron Curie, CMA

## 2011-03-12 NOTE — Telephone Encounter (Signed)
This is all ok

## 2011-03-13 ENCOUNTER — Encounter: Payer: Self-pay | Admitting: Critical Care Medicine

## 2011-03-27 ENCOUNTER — Ambulatory Visit (INDEPENDENT_AMBULATORY_CARE_PROVIDER_SITE_OTHER): Payer: Medicare Other | Admitting: Family Medicine

## 2011-03-27 DIAGNOSIS — I4891 Unspecified atrial fibrillation: Secondary | ICD-10-CM

## 2011-03-27 LAB — POCT INR: INR: 1.6

## 2011-03-27 NOTE — Patient Instructions (Signed)
2.5mg . Wednesday only and all other days of the week 5mg .

## 2011-04-10 ENCOUNTER — Ambulatory Visit (INDEPENDENT_AMBULATORY_CARE_PROVIDER_SITE_OTHER): Payer: Medicare Other | Admitting: Family Medicine

## 2011-04-10 DIAGNOSIS — I4891 Unspecified atrial fibrillation: Secondary | ICD-10-CM

## 2011-04-10 DIAGNOSIS — Z7901 Long term (current) use of anticoagulants: Secondary | ICD-10-CM

## 2011-04-10 LAB — POCT INR: INR: 2.2

## 2011-04-10 NOTE — Patient Instructions (Signed)
2.5mg . Wednesday only and all other days of the week 5mg .check in 2 weeks

## 2011-04-23 ENCOUNTER — Ambulatory Visit: Payer: Medicare Other | Admitting: Critical Care Medicine

## 2011-04-24 ENCOUNTER — Ambulatory Visit (INDEPENDENT_AMBULATORY_CARE_PROVIDER_SITE_OTHER): Payer: Medicare Other | Admitting: Family Medicine

## 2011-04-24 DIAGNOSIS — I4891 Unspecified atrial fibrillation: Secondary | ICD-10-CM

## 2011-04-24 NOTE — Patient Instructions (Signed)
Same dose 

## 2011-05-14 ENCOUNTER — Other Ambulatory Visit: Payer: Self-pay | Admitting: Cardiology

## 2011-05-16 ENCOUNTER — Ambulatory Visit: Payer: Medicare Other | Admitting: Family Medicine

## 2011-05-22 ENCOUNTER — Encounter: Payer: Self-pay | Admitting: Family Medicine

## 2011-05-22 ENCOUNTER — Ambulatory Visit (INDEPENDENT_AMBULATORY_CARE_PROVIDER_SITE_OTHER): Payer: Medicare Other | Admitting: Family Medicine

## 2011-05-22 DIAGNOSIS — Z23 Encounter for immunization: Secondary | ICD-10-CM

## 2011-05-22 DIAGNOSIS — I4891 Unspecified atrial fibrillation: Secondary | ICD-10-CM

## 2011-05-22 DIAGNOSIS — I10 Essential (primary) hypertension: Secondary | ICD-10-CM

## 2011-05-22 DIAGNOSIS — E785 Hyperlipidemia, unspecified: Secondary | ICD-10-CM

## 2011-05-22 DIAGNOSIS — I2510 Atherosclerotic heart disease of native coronary artery without angina pectoris: Secondary | ICD-10-CM

## 2011-05-22 DIAGNOSIS — M48061 Spinal stenosis, lumbar region without neurogenic claudication: Secondary | ICD-10-CM

## 2011-05-22 LAB — BASIC METABOLIC PANEL
BUN: 21 mg/dL (ref 6–23)
Creatinine, Ser: 1 mg/dL (ref 0.4–1.5)
GFR: 78.75 mL/min (ref >60.00)
Potassium: 4.3 mEq/L (ref 3.5–5.1)

## 2011-05-22 LAB — POCT INR: INR: 2.6

## 2011-05-22 MED ORDER — TRAMADOL HCL 50 MG PO TABS: 50.0000 mg | ORAL_TABLET | Freq: Four times a day (QID) | ORAL | Status: AC | PRN

## 2011-05-22 MED ORDER — RIVASTIGMINE 4.6 MG/24HR TD PT24: 1.0000 | MEDICATED_PATCH | Freq: Every day | TRANSDERMAL | Status: DC

## 2011-05-22 NOTE — Progress Notes (Signed)
Subjective:    Patient ID: [name], male    DOB: 1922/02/08, 77 y.o.   MRN: [id number]  HPI Patient seen with multiple issues. Accompanied by wife and daughter. His current problems are obstructive sleep apnea, hyperlipidemia, hypertension, CAD, atrial fibrillation, cerebrovascular disease, COPD and progressive memory loss.  He is getting much more forgetful with day-to-day activities. Recent B12 and TSH were normal. Family is requesting medication to help with memory issues at this time.  History of atrial fibrillation. On Coumadin. No recent bleeding complications. Needs followup INR today  Hypertension treated with amlodipine, furosemide, lisinopril, and metoprolol. Blood pressure stable by home reading. No orthostatic symptoms.  Progressive low back pain. Has seen orthopedist. MRI scan last fall revealing lumbar stenosis. Pain mostly lower lumbar region. Somewhat worse with ambulation. Very high risk of falls. Progressively less mobile and more unsteady with gait per family.  Past Medical History  Diagnosis Date  . Encounter for long-term (current) use of anticoagulants 12/09/2010  . Hypoxemia 05/15/2009  . Atrial fibrillation 01/19/2009  . CORONARY ARTERY DISEASE 10/07/2007  . AORTIC STENOSIS, SEVERE 10/07/2007  . PVD 10/07/2007  . HYPERLIPIDEMIA 10/07/2007  . HYPERTENSION 10/07/2007  . CEREBROVASCULAR DISEASE 10/07/2007  . DYSPNEA 02/14/2008  . OBSTRUCTIVE SLEEP APNEA 10/07/2007  . Asbestosis 02/21/2008  . History of colon cancer   . DDD (degenerative disc disease)     low back  . Colon polyp    Past Surgical History  Procedure Date  . Abdominal aortic aneurysm repair 1992  . Coronary artery bypass graft 2005  . Aortic valve replacement 2005  . Knee surgery     left    reports that he quit smoking about 37 years ago. His smoking use included Cigarettes. He has a 20 pack-year smoking history. He has never used smokeless tobacco. He reports that he does not drink alcohol or use illicit  drugs. family history includes COPD in an unspecified family member; Colon cancer in an unspecified family member; Coronary artery disease in an unspecified family member; Heart disease in his father and unspecified family member; Lung cancer in an unspecified family member; and Stroke (age of onset:61) in his mother. Allergies  Allergen Reactions  . Morphine     REACTION: unk      Review of Systems  Constitutional: Negative for fever and chills.  Respiratory: Negative for cough and shortness of breath.   Cardiovascular: Negative for chest pain and palpitations.  Gastrointestinal: Negative for nausea, vomiting and abdominal pain.  Genitourinary: Negative for dysuria.  Neurological: Positive for weakness. Negative for dizziness, syncope and headaches.       Objective:   Physical Exam  Constitutional: He appears well-developed and well-nourished.  HENT:  Mouth/Throat: Oropharynx is clear and moist.  Neck: Neck supple.  Cardiovascular:       Irregularly irregular rhythm rate controlled  Pulmonary/Chest: Effort normal and breath sounds normal. No respiratory distress. He has no wheezes. He has no rales.  Musculoskeletal:       Trace nonpitting edema legs bilaterally  Lymphadenopathy:    He has no cervical adenopathy.  Neurological: He is alert. No cranial nerve deficit.       Patient has very slow gait and has stooped posture. Ambulates with a cane          Assessment & Plan:  #1 lumbar stenosis. High risk of falls. Schedule home physical therapy.  Ultram as needed for low back pain and review possible side effects noted below  #2 atrial  fibrillation. Rate control. Reassess INR  #3 hypertension. Stable. Reassess basic metabolic panel on furosemide #4 progressive memory loss. Long discussion with family regarding pros and cons of medication and they're requesting trial of Exelon patch. He has no history of seizures. Start lowest dose and review possible GI side effects.   Reassess one month #5 health maintenance. Flu vaccine given

## 2011-05-22 NOTE — Patient Instructions (Signed)
2.5mg . Wednesday only and all other days of the week 5mg .check in 4 weeks

## 2011-05-23 NOTE — Progress Notes (Signed)
Quick Note:  Pt wife informed ______ 

## 2011-05-26 LAB — CBC
HCT: 35.5 — ABNORMAL LOW
HCT: 37.6 — ABNORMAL LOW
HCT: 38.8 — ABNORMAL LOW
Hemoglobin: 12 — ABNORMAL LOW
Hemoglobin: 13.1
MCHC: 33.6
MCHC: 33.7
MCV: 93.4
MCV: 94
MCV: 94.1
Platelets: 210
Platelets: 252
RBC: 3.8 — ABNORMAL LOW
RBC: 4.03 — ABNORMAL LOW
RBC: 4.12 — ABNORMAL LOW
RBC: 4.34
WBC: 10.7 — ABNORMAL HIGH
WBC: 9.1
WBC: 9.5
WBC: 9.8

## 2011-05-26 LAB — PROTIME-INR
INR: 1
Prothrombin Time: 13.8
Prothrombin Time: 14.6
Prothrombin Time: 30 — ABNORMAL HIGH

## 2011-05-26 LAB — BASIC METABOLIC PANEL
BUN: 20
Creatinine, Ser: 1.19
GFR calc non Af Amer: 58 — ABNORMAL LOW
Potassium: 4

## 2011-05-26 LAB — CARDIAC PANEL(CRET KIN+CKTOT+MB+TROPI)
CK, MB: 1.6
Relative Index: INVALID
Relative Index: INVALID
Total CK: 52

## 2011-05-26 LAB — DIFFERENTIAL
Basophils Absolute: 0
Eosinophils Absolute: 0.1
Lymphocytes Relative: 21
Lymphs Abs: 1.8
Neutrophils Relative %: 70

## 2011-05-26 LAB — COMPREHENSIVE METABOLIC PANEL
ALT: 25
CO2: 24
Calcium: 9.1
Creatinine, Ser: 0.93
GFR calc non Af Amer: >60
Glucose, Bld: 152 — ABNORMAL HIGH

## 2011-05-26 LAB — LIPID PANEL
LDL Cholesterol: 48
Total CHOL/HDL Ratio: 2.9
VLDL: 10

## 2011-05-26 LAB — HEMOGLOBIN A1C: Hgb A1c MFr Bld: 6.1

## 2011-05-26 LAB — HEPARIN LEVEL (UNFRACTIONATED)
Heparin Unfractionated: 0.4
Heparin Unfractionated: 0.53

## 2011-05-26 LAB — TSH: TSH: 0.952

## 2011-05-26 LAB — POCT CARDIAC MARKERS: CKMB, poc: 1.3

## 2011-05-26 LAB — D-DIMER, QUANTITATIVE: D-Dimer, Quant: 1.24 — ABNORMAL HIGH

## 2011-05-27 LAB — PROTIME-INR
INR: 2.1 — ABNORMAL HIGH
Prothrombin Time: 24.9 — ABNORMAL HIGH

## 2011-05-29 ENCOUNTER — Telehealth: Payer: Self-pay | Admitting: Critical Care Medicine

## 2011-05-29 LAB — DIFFERENTIAL
Basophils Absolute: 0
Lymphocytes Relative: 31
Monocytes Absolute: 0.9
Neutro Abs: 5.1

## 2011-05-29 LAB — POCT I-STAT, CHEM 8
Creatinine, Ser: 1.3
HCT: 38 — ABNORMAL LOW
Hemoglobin: 12.9 — ABNORMAL LOW
Potassium: 4.2
Sodium: 138

## 2011-05-29 LAB — CBC
Hemoglobin: 12.6 — ABNORMAL LOW
RDW: 14.1

## 2011-05-29 LAB — PROTIME-INR
INR: 2.7 — ABNORMAL HIGH
Prothrombin Time: 29.8 — ABNORMAL HIGH

## 2011-05-29 MED ORDER — ESOMEPRAZOLE MAGNESIUM 40 MG PO CPDR: 40.0000 mg | DELAYED_RELEASE_CAPSULE | Freq: Every day | ORAL | Status: DC

## 2011-05-29 NOTE — Telephone Encounter (Signed)
I spoke with pt wife and she states pt needs 90 day supply [facility]. I advised pt wife will send rx

## 2011-06-13 DIAGNOSIS — F0390 Unspecified dementia without behavioral disturbance: Secondary | ICD-10-CM

## 2011-06-13 DIAGNOSIS — R269 Unspecified abnormalities of gait and mobility: Secondary | ICD-10-CM

## 2011-06-13 DIAGNOSIS — M4800 Spinal stenosis, site unspecified: Secondary | ICD-10-CM

## 2011-06-13 DIAGNOSIS — M6281 Muscle weakness (generalized): Secondary | ICD-10-CM

## 2011-06-17 ENCOUNTER — Encounter: Payer: Self-pay | Admitting: Cardiology

## 2011-06-17 ENCOUNTER — Ambulatory Visit (INDEPENDENT_AMBULATORY_CARE_PROVIDER_SITE_OTHER): Payer: Medicare Other | Admitting: Cardiology

## 2011-06-17 DIAGNOSIS — Z951 Presence of aortocoronary bypass graft: Secondary | ICD-10-CM

## 2011-06-17 DIAGNOSIS — I679 Cerebrovascular disease, unspecified: Secondary | ICD-10-CM

## 2011-06-17 DIAGNOSIS — R413 Other amnesia: Secondary | ICD-10-CM

## 2011-06-17 DIAGNOSIS — I359 Nonrheumatic aortic valve disorder, unspecified: Secondary | ICD-10-CM

## 2011-06-17 DIAGNOSIS — I739 Peripheral vascular disease, unspecified: Secondary | ICD-10-CM

## 2011-06-17 DIAGNOSIS — I4891 Unspecified atrial fibrillation: Secondary | ICD-10-CM

## 2011-06-17 DIAGNOSIS — I2510 Atherosclerotic heart disease of native coronary artery without angina pectoris: Secondary | ICD-10-CM

## 2011-06-17 DIAGNOSIS — Z7901 Long term (current) use of anticoagulants: Secondary | ICD-10-CM

## 2011-06-17 MED ORDER — ASPIRIN EC 325 MG PO TBEC: 325.0000 mg | DELAYED_RELEASE_TABLET | Freq: Every day | ORAL | Status: AC

## 2011-06-17 NOTE — Assessment & Plan Note (Signed)
He has fallen 3 or more times the last several months. His balance is not good his dementia has progressed. I think the risk of continuing Coumadin outweighs benefit. I discussed this with his daughter. We will change to 325 mg of aspirin per day and stop Coumadin.

## 2011-06-17 NOTE — Progress Notes (Signed)
HPI Mr. [name] comes in today for evaluation management of his multiple cardiac and vascular problems.  Her daughter comes with him today. His dementia is gotten worse and his mobility requires a walker for support. He has fallen 3 or more times the last several months. In fact, he got tangled up in the garden hose this morning and fell flat on his face. His nose is red as is his upper lip and swollen. He has had no spells that his changes since.  He denies any chest pain or angina. He said orthopnea PND or syncope.  He remains on Coumadin. Past Medical History  Diagnosis Date  . Encounter for long-term (current) use of anticoagulants 12/09/2010  . Hypoxemia 05/15/2009  . Atrial fibrillation 01/19/2009  . CORONARY ARTERY DISEASE 10/07/2007  . AORTIC STENOSIS, SEVERE 10/07/2007  . PVD 10/07/2007  . HYPERLIPIDEMIA 10/07/2007  . HYPERTENSION 10/07/2007  . CEREBROVASCULAR DISEASE 10/07/2007  . DYSPNEA 02/14/2008  . OBSTRUCTIVE SLEEP APNEA 10/07/2007  . Asbestosis 02/21/2008  . History of colon cancer   . DDD (degenerative disc disease)     low back  . Colon polyp     Past Surgical History  Procedure Date  . Abdominal aortic aneurysm repair 1992  . Coronary artery bypass graft 2005  . Aortic valve replacement 2005  . Knee surgery     left    Family History  Problem Relation Age of Onset  . COPD    . Colon cancer    . Lung cancer    . Heart disease    . Coronary artery disease    . Stroke Mother 4  . Heart disease Father     History   Social History  . Marital Status: Married    Spouse Name: N/A    Number of Children: N/A  . Years of Education: N/A   Occupational History  . Not on file.   Social History Main Topics  . Smoking status: Former Smoker -- 1.0 packs/day for 20 years    Types: Cigarettes    Quit date: 12/16/1973  . Smokeless tobacco: Never Used  . Alcohol Use: No  . Drug Use: No  . Sexually Active: Not on file   Other Topics Concern  . Not on file   Social  History Narrative  . No narrative on file    Allergies  Allergen Reactions  . Morphine     REACTION: unk    Current Outpatient Prescriptions  Medication Sig Dispense Refill  . amLODipine (NORVASC) 5 MG tablet Take 1 tablet (5 mg total) by mouth daily.  90 tablet  3  . arformoterol (BROVANA) 15 MCG/2ML NEBU Take 15 mcg by nebulization 2 (two) times daily.        . budesonide (PULMICORT) 0.25 MG/2ML nebulizer solution Take 0.25 mg by nebulization 2 (two) times daily.        Marland Kitchen esomeprazole (NEXIUM) 40 MG capsule Take 1 capsule (40 mg total) by mouth daily before breakfast.  90 capsule  2  . furosemide (LASIX) 40 MG tablet Take 40 mg by mouth every other day.        . lisinopril (PRINIVIL,ZESTRIL) 40 MG tablet Take 40 mg by mouth daily.        . metoprolol (TOPROL-XL) 50 MG 24 hr tablet Take 50 mg by mouth daily.        . Multiple Vitamin (MULTIVITAMIN) tablet Take 1 tablet by mouth daily.        . NON FORMULARY  CPAP and O2 2 liters at bedtime, sleeping  O2 4 liters nasal canula with activity       . potassium chloride SA (K-DUR,KLOR-CON) 20 MEQ tablet Take 1 tablet (20 mEq total) by mouth daily.  30 tablet  11  . pravastatin (PRAVACHOL) 40 MG tablet Take 40 mg by mouth daily.        . rivastigmine (EXELON) 4.6 mg/24hr Place 1 patch (4.6 mg total) onto the skin daily.  30 patch  12    ROS Negative other than HPI.   PE General Appearance: well developed, well nourished in no acute distress, elderly, frail HEENT: symmetrical face, PERRLA, good dentition, swollen right upper lip with no active bleeding, dried blood from his right nare  Neck: no JVD, thyromegaly, or adenopathy, trachea midline Chest: symmetric without deformity Cardiac: PMI non-displaced, A regular rate and rhythm, normal S1, S2, no gallop, soft systolic murmur Lung: clear to ausculation and percussion Vascular: all pulses full without bruits  Abdominal: nondistended, nontender, good bowel sounds, no HSM, no  bruits Extremities: no cyanosis, clubbing or edema, no sign of DVT, no varicosities  Skin: normal color, no rashes Neuro: alert and oriented x 3, non-focal Pysch: normal affect Filed Vitals:   06/17/11 1437  BP: 146/60  Pulse: 70  Height: 5\' 7"  (1.702 m)  Weight: 187 lb (84.823 kg)    EKG Chronic atrial fib with a right axis, incomplete right bundle, no change Labs and Studies Reviewed.   Lab Results  Component Value Date   WBC 9.6 12/17/2010   HGB 12.4* 12/17/2010   HCT 36.7* 12/17/2010   MCV 93.6 12/17/2010   PLT 158.0 12/17/2010      Chemistry      Component Value Date/Time   NA 141 05/22/2011 1423   K 4.3 05/22/2011 1423   CL 104 05/22/2011 1423   CO2 31 05/22/2011 1423   BUN 21 05/22/2011 1423   CREATININE 1.0 05/22/2011 1423      Component Value Date/Time   CALCIUM 9.0 05/22/2011 1423   ALKPHOS 93 12/17/2010 1158   AST 24 12/17/2010 1158   ALT 20 12/17/2010 1158   BILITOT 1.1 12/17/2010 1158       Lab Results  Component Value Date   CHOL 108 12/17/2010   CHOL  Value: 89        ATP III CLASSIFICATION:  <200     mg/dL   Desirable  161-096  mg/dL   Borderline High  >=045    mg/dL   High 12/08/8117   Lab Results  Component Value Date   HDL 48.70 12/17/2010   HDL 31* 11/24/2007   Lab Results  Component Value Date   LDLCALC 49 12/17/2010   LDLCALC  Value: 48        Total Cholesterol/HDL:CHD Risk Coronary Heart Disease Risk Table                     Men   Women  1/2 Average Risk   3.4   3.3 11/24/2007   Lab Results  Component Value Date   TRIG 54.0 12/17/2010   TRIG 51 11/24/2007   Lab Results  Component Value Date   CHOLHDL 2 12/17/2010   CHOLHDL 2.9 11/24/2007   Lab Results  Component Value Date   HGBA1C  Value: 6.1 (NOTE)   The ADA recommends the following therapeutic goals for glycemic   control related to Hgb A1C measurement:   Goal of Therapy:   < 7.0%  Hgb A1C   Action Suggested:  > 8.0% Hgb A1C   Ref:  Diabetes Care, 22, Suppl. 1, 1999 11/24/2007   Lab Results   Component Value Date   ALT 20 12/17/2010   AST 24 12/17/2010   ALKPHOS 93 12/17/2010   BILITOT 1.1 12/17/2010   Lab Results  Component Value Date   TSH 0.73 12/17/2010

## 2011-06-17 NOTE — Patient Instructions (Signed)
Your physician has recommended you make the following change in your medication:  Stop Coumadin Increase Aspirin  Your physician wants you to follow-up in: 1 year with Dr. Daleen Squibb.  You will receive a reminder letter in the mail two months in advance. If you don't receive a letter, please call our office to schedule the follow-up appointment.

## 2011-06-17 NOTE — Assessment & Plan Note (Signed)
Deteriorated.  

## 2011-06-20 ENCOUNTER — Telehealth: Payer: Self-pay | Admitting: *Deleted

## 2011-06-20 NOTE — Telephone Encounter (Addendum)
PT from [facility] called to let Dr. Caryl Never know pt fell again this week and saw Cardiology.  They took him off of is Coumadin and increase ASA to 325 mg.  Appt next Thursday with Dr. Caryl Never.  PT will DC him as he and his wife are non -compliant with all safety measures that they are attempting to impress upon them.  They do not feel PT will benefit him at this point.  He is not using his O2 as directed, and he goes to 82 percent when he walks.

## 2011-06-26 ENCOUNTER — Ambulatory Visit (INDEPENDENT_AMBULATORY_CARE_PROVIDER_SITE_OTHER): Payer: Medicare Other | Admitting: Family Medicine

## 2011-06-26 ENCOUNTER — Ambulatory Visit: Payer: Medicare Other | Admitting: Family Medicine

## 2011-06-26 ENCOUNTER — Encounter: Payer: Self-pay | Admitting: Family Medicine

## 2011-06-26 DIAGNOSIS — Z9181 History of falling: Secondary | ICD-10-CM

## 2011-06-26 DIAGNOSIS — M48061 Spinal stenosis, lumbar region without neurogenic claudication: Secondary | ICD-10-CM

## 2011-06-26 DIAGNOSIS — R413 Other amnesia: Secondary | ICD-10-CM

## 2011-06-26 DIAGNOSIS — R296 Repeated falls: Secondary | ICD-10-CM

## 2011-06-26 NOTE — Patient Instructions (Signed)
Touch base in one month regarding his memory issues.  We will consider titrating Exelon at that time. Notify me if he has any nausea or vomiting on Exelon patch.

## 2011-06-26 NOTE — Progress Notes (Signed)
Subjective:    Patient ID: [name], male    DOB: [birth year], 77 y.o.   MRN: [id number]  HPI  Medical followup. Patient has multiple chronic problems including history of hyperlipidemia, hypertension, CAD, atrial fibrillation, cerebrovascular disease, COPD, dementia, and lumbar stenosis. Frequent falls. Coumadin discontinued by cardiology. Family thinks he is feeling better off the Coumadin. Has gone back to regular aspirin.  Ongoing low back pain. Lumbar stenosis. Tramadol family thought made him too sedated. He is previously seeing orthopedic specialist and has had what sounds like epidural injections previously. They'll consider followup with them.  Memory loss. Trial Exelon patch and only took for about 2 weeks. He did not have any true side effects but family did not feel this was working. We explained that this was a starting dose and has to be titrated and also this is not a quick improvement process. He did not have any nausea or vomiting. He had some sedation but we explained this is likely secondary to tramadol. No fall since last visit  Past Medical History  Diagnosis Date  . Encounter for long-term (current) use of anticoagulants 12/09/2010  . Hypoxemia 05/15/2009  . Atrial fibrillation 01/19/2009  . CORONARY ARTERY DISEASE 10/07/2007  . AORTIC STENOSIS, SEVERE 10/07/2007  . PVD 10/07/2007  . HYPERLIPIDEMIA 10/07/2007  . HYPERTENSION 10/07/2007  . CEREBROVASCULAR DISEASE 10/07/2007  . DYSPNEA 02/14/2008  . OBSTRUCTIVE SLEEP APNEA 10/07/2007  . Asbestosis 02/21/2008  . History of colon cancer   . DDD (degenerative disc disease)     low back  . Colon polyp    Past Surgical History  Procedure Date  . Abdominal aortic aneurysm repair 1992  . Coronary artery bypass graft 2005  . Aortic valve replacement 2005  . Knee surgery     left    reports that he quit smoking about 37 years ago. His smoking use included Cigarettes. He has a 20 pack-year smoking history. He has never used smokeless  tobacco. He reports that he does not drink alcohol or use illicit drugs. family history includes COPD in an unspecified family member; Colon cancer in an unspecified family member; Coronary artery disease in an unspecified family member; Heart disease in his father and unspecified family member; Lung cancer in an unspecified family member; and Stroke (age of onset:61) in his mother. Allergies  Allergen Reactions  . Morphine     REACTION: unk      Review of Systems  Constitutional: Negative for fever and chills.  Respiratory: Negative for cough and shortness of breath.   Cardiovascular: Negative for chest pain and palpitations.  Gastrointestinal: Negative for abdominal pain.  Neurological: Negative for dizziness, weakness and headaches.  Psychiatric/Behavioral: Negative for dysphoric mood and agitation.       Objective:   Physical Exam  Constitutional: He appears well-developed and well-nourished.  Neck: Neck supple.  Cardiovascular: Normal rate.        Irregular rhythm rate controlled  Pulmonary/Chest: Effort normal and breath sounds normal. No respiratory distress. He has no wheezes. He has no rales.  Musculoskeletal:       Trace edema lower legs bilaterally          Assessment & Plan:  #1 memory loss. We've recommended starting back Exelon patch since he denies any clear side effects. We explained the titration process-in one month consider titrate to 9.5 mg. Touch base in one month of no side effects then consider further titration. #2 history of atrial fibrillation. Currently off Coumadin on aspirin.  High risk of falls #3 lumbar stenosis. They are encouraged to followup with orthopedist whom they've seen previously regarding this. He is not a good surgical candidate

## 2011-06-29 ENCOUNTER — Observation Stay (HOSPITAL_COMMUNITY)
Admission: EM | Admit: 2011-06-29 | Discharge: 2011-07-07 | DRG: 064 | Disposition: A | Discharge status: Skilled Nursing Facility | Payer: Medicare Other | Attending: Neurology | Admitting: Neurology

## 2011-06-29 ENCOUNTER — Emergency Department (HOSPITAL_COMMUNITY): Payer: Medicare Other

## 2011-06-29 DIAGNOSIS — I639 Cerebral infarction, unspecified: Secondary | ICD-10-CM | POA: Diagnosis present

## 2011-06-29 DIAGNOSIS — Z951 Presence of aortocoronary bypass graft: Secondary | ICD-10-CM | POA: Insufficient documentation

## 2011-06-29 DIAGNOSIS — Z79899 Other long term (current) drug therapy: Secondary | ICD-10-CM | POA: Insufficient documentation

## 2011-06-29 DIAGNOSIS — Z9181 History of falling: Secondary | ICD-10-CM | POA: Insufficient documentation

## 2011-06-29 DIAGNOSIS — I4891 Unspecified atrial fibrillation: Secondary | ICD-10-CM | POA: Insufficient documentation

## 2011-06-29 DIAGNOSIS — E785 Hyperlipidemia, unspecified: Secondary | ICD-10-CM | POA: Insufficient documentation

## 2011-06-29 DIAGNOSIS — N390 Urinary tract infection, site not specified: Secondary | ICD-10-CM | POA: Insufficient documentation

## 2011-06-29 DIAGNOSIS — I6350 Cerebral infarction due to unspecified occlusion or stenosis of unspecified cerebral artery: Principal | ICD-10-CM | POA: Insufficient documentation

## 2011-06-29 DIAGNOSIS — I509 Heart failure, unspecified: Secondary | ICD-10-CM | POA: Insufficient documentation

## 2011-06-29 DIAGNOSIS — J690 Pneumonitis due to inhalation of food and vomit: Secondary | ICD-10-CM | POA: Diagnosis not present

## 2011-06-29 DIAGNOSIS — G8190 Hemiplegia, unspecified affecting unspecified side: Secondary | ICD-10-CM | POA: Insufficient documentation

## 2011-06-29 DIAGNOSIS — I10 Essential (primary) hypertension: Secondary | ICD-10-CM | POA: Insufficient documentation

## 2011-06-29 DIAGNOSIS — R1319 Other dysphagia: Secondary | ICD-10-CM | POA: Insufficient documentation

## 2011-06-29 LAB — DIFFERENTIAL
Basophils Absolute: 0 10*3/uL (ref 0.0–0.1)
Eosinophils Relative: 1 % (ref 0–5)
Lymphocytes Relative: 34 % (ref 12–46)
Monocytes Relative: 6 % (ref 3–12)
Neutrophils Relative %: 59 % (ref 43–77)

## 2011-06-29 LAB — PROTIME-INR
INR: 1.03 (ref 0.00–1.49)
Prothrombin Time: 13.7 seconds (ref 11.6–15.2)

## 2011-06-29 LAB — POCT I-STAT, CHEM 8
Chloride: 104 mEq/L (ref 96–112)
HCT: 40 % (ref 39.0–52.0)
Potassium: 4.1 mEq/L (ref 3.5–5.1)
Sodium: 139 mEq/L (ref 135–145)

## 2011-06-29 LAB — CK TOTAL AND CKMB (NOT AT ARMC)
CK, MB: 6.6 ng/mL (ref 0.3–4.0)
Relative Index: 6.2 — ABNORMAL HIGH (ref 0.0–2.5)

## 2011-06-29 LAB — COMPREHENSIVE METABOLIC PANEL
Albumin: 4.1 g/dL (ref 3.5–5.2)
BUN: 27 mg/dL — ABNORMAL HIGH (ref 6–23)
Chloride: 100 mEq/L (ref 96–112)
Creatinine, Ser: 1.03 mg/dL (ref 0.50–1.35)
GFR calc Af Amer: 73 mL/min — ABNORMAL LOW (ref >90)
Glucose, Bld: 126 mg/dL — ABNORMAL HIGH (ref 70–99)
Total Bilirubin: 0.7 mg/dL (ref 0.3–1.2)
Total Protein: 7.1 g/dL (ref 6.0–8.3)

## 2011-06-29 LAB — CBC
HCT: 38.9 % — ABNORMAL LOW (ref 39.0–52.0)
Hemoglobin: 13.1 g/dL (ref 13.0–17.0)
WBC: 11.7 10*3/uL — ABNORMAL HIGH (ref 4.0–10.5)

## 2011-06-29 LAB — URINALYSIS, ROUTINE W REFLEX MICROSCOPIC
Glucose, UA: NEGATIVE mg/dL
Ketones, ur: NEGATIVE mg/dL
Leukocytes, UA: NEGATIVE
Nitrite: NEGATIVE
Protein, ur: NEGATIVE mg/dL
Urobilinogen, UA: 1 mg/dL (ref 0.0–1.0)

## 2011-06-29 LAB — APTT: aPTT: 31 seconds (ref 24–37)

## 2011-06-30 ENCOUNTER — Inpatient Hospital Stay (HOSPITAL_COMMUNITY): Payer: Medicare Other

## 2011-06-30 DIAGNOSIS — I059 Rheumatic mitral valve disease, unspecified: Secondary | ICD-10-CM

## 2011-06-30 LAB — CARDIAC PANEL(CRET KIN+CKTOT+MB+TROPI)
CK, MB: 12.2 ng/mL (ref 0.3–4.0)
CK, MB: 11.4 ng/mL (ref 0.3–4.0)
Relative Index: 3.5 — ABNORMAL HIGH (ref 0.0–2.5)
Total CK: 347 U/L — ABNORMAL HIGH (ref 7–232)
Total CK: 355 U/L — ABNORMAL HIGH (ref 7–232)
Troponin I: <0.3 ng/mL (ref <0.30)
Troponin I: <0.3 ng/mL (ref <0.30)
Troponin I: <0.3 ng/mL (ref <0.30)

## 2011-06-30 LAB — GLUCOSE, CAPILLARY
Glucose-Capillary: 131 mg/dL — ABNORMAL HIGH (ref 70–99)
Glucose-Capillary: 133 mg/dL — ABNORMAL HIGH (ref 70–99)
Glucose-Capillary: 150 mg/dL — ABNORMAL HIGH (ref 70–99)
Glucose-Capillary: 154 mg/dL — ABNORMAL HIGH (ref 70–99)

## 2011-06-30 LAB — LIPID PANEL: LDL Cholesterol: 68 mg/dL (ref 0–99)

## 2011-06-30 LAB — HEMOGLOBIN A1C: Mean Plasma Glucose: 128 mg/dL — ABNORMAL HIGH (ref <117)

## 2011-06-30 NOTE — Telephone Encounter (Signed)
Dr. Burchette notified. 

## 2011-07-01 ENCOUNTER — Telehealth: Payer: Self-pay | Admitting: Critical Care Medicine

## 2011-07-01 ENCOUNTER — Telehealth: Payer: Self-pay | Admitting: Family Medicine

## 2011-07-01 ENCOUNTER — Inpatient Hospital Stay (HOSPITAL_COMMUNITY): Payer: Medicare Other

## 2011-07-01 LAB — GLUCOSE, CAPILLARY
Glucose-Capillary: 103 mg/dL — ABNORMAL HIGH (ref 70–99)
Glucose-Capillary: 113 mg/dL — ABNORMAL HIGH (ref 70–99)

## 2011-07-01 NOTE — Telephone Encounter (Signed)
Noted, we will try to round on this pt.

## 2011-07-01 NOTE — Telephone Encounter (Signed)
I spoke with the pt wife and she states the pt is in [facility] room 3706 due to having a stroke. She states that he keeps pulling the cpap mask off that Dr. Delford Field prescribed and the respiratory tech requested that the pt wife call Dr. Delford Field for any suggestions and to see if he wanted to come see the patient. I advised of proper protocol that his attending request a consult from Dr. Delford Field, but the wife insists on a message being sent in the meantime. Please advise. Carron Curie, CMA

## 2011-07-01 NOTE — Telephone Encounter (Signed)
Pt is in room 3735 at [facility] .

## 2011-07-01 NOTE — H&P (Signed)
Joshua, Walsh NO.:  [id number]  MEDICAL RECORD NO.:  [id number]  LOCATION:  3735                         FACILITY:  [facility]  PHYSICIAN:  Lajuana Carry, MD       DATE OF BIRTH:  1922-08-21  DATE OF ADMISSION:  06/29/2011 DATE OF DISCHARGE:                             HISTORY & PHYSICAL   CHIEF COMPLAINT:  Left-sided weakness.  HISTORY OF PRESENT ILLNESS:  [name] is an 77 year old male with a history of atrial fibrillation who presented tonight with acute onset of left-sided weakness.  The patient was at home, this morning when he is in his typical state of health.  The patient states that around 2 o'clock he started experiencing troubles but he cannot elaborate on this further.  Later in the afternoon, the patient's wife laid down for a nap and when she woke up, she found him having difficulty with speech and weakness on the left side of his body.  Due to this, EMS was called and the patient was transported here.  A code stroke was initiated on arrival but was canceled due to the unknown type of onset.  The patient denies any chest pain, palpitations, cough, shortness of breath, double vision, vertigo, numbness, abdominal pain, incontinence, arthralgias, or myalgias.  PAST MEDICAL HISTORY: 1. Hyperlipidemia. 2. Atrial fibrillation. 3. Hypertension. 4. CABG. 5. CHF.  MEDICATIONS: 1. Norvasc 5 mg daily. 2. Nexium 40 mg daily. 3. Lasix 40 mg daily. 4. Lisinopril 40 mg daily. 5. Metoprolol XL 50 mg daily. 6. K-Dur 20 mEq daily. 7. Pravachol 40 mg daily. 8. Exelon patch 4.6 mg per 24 hours. 9. Aspirin 325 mg daily.  ALLERGIES:  None.  FAMILY HISTORY:  There is no family history of stroke.  SOCIAL HISTORY:  The patient denies tobacco, alcohol, and illicit drug use.  The patient lives at home with his wife.  REVIEW OF SYSTEMS:  A complete review of systems was performed and was negative except for that which was mentioned above.  PHYSICAL  EXAMINATION:  VITAL SIGNS:  Blood pressure 143/77, heart rate of 85, respirations 15, and oxygen saturation of 97%. GENERAL:  The patient is awake, alert, and easily responsive. HEENT:  The patient's oropharynx was clear and he had no cervical lymphadenopathy. CARDIOVASCULAR:  He had regular rate and rhythm without murmur.  No carotid bruits were noted. LUNGS:  Clear to auscultation bilaterally. ABDOMEN:  Soft, nontender, nondistended with bowel sounds. SKIN:  He has 2+ bilateral pitting edema of his lower extremities to his mention. MENTAL STATUS:  The patient was awake, alert, and oriented x3.  He could follow simple and complex commands.  Attention and memory appear to be intact.  He was moderately dysarthric with his speech, but fluent. CRANIAL NERVES:  Cranial nerves II, the patient's visual fields appeared full to confrontation.  The patient deferred funduscopic exam.  Cranial nerves III, IV, and VI, his extraocular movements were intact.  His pupils were equal, round, reactive to light and accommodation.  Cranial nerves V, facial sensation was symmetric.  Cranial nerves VII, the patient demonstrated a left lower facial weakness.  Cranial nerves VIII and he has decreased hearing  bilaterally.  Cranial nerve IX and X, palate and uvula were in the midline and elevated symmetrically. Cranial nerves XI, the patient could turn his head in both directions. Cranial nerve XII with tongue protrudes in the midline. MOTOR:  This demonstrated left-sided hemiplegia with full strength on the right side.  He did have increased rigidity on the left arm and leg with some spontaneous ballistic movements. SENSATION:  Sensation was intact to pain in both, bilateral upper and lower extremities.  His reflexes were 1+ throughout with bilateral Babinski's and left-sided triple flexion.  There was some spontaneous left triple flexion present also. COORDINATION:  He had intact finger-to-nose on the right  and heel-to- shin on the right but he could not perform this on the left. GAIT:  Deferred due to his left weakness.  TESTS:  His CBC, CMP, and cardiac enzymes were performed today and these results were reviewed.  The patient had mild leukocytosis of 11.7 and a slightly elevated CK-MB of 6.6.  Otherwise, his lab work was normal.  EKG demonstrated atrial fibrillation.  CT scan of the head demonstrated diffuse atrophy and multiple small prior strokes, but there was no acute intracranial abnormality seen.  ASSESSMENT:  This is an 77 year old male with atrial fibrillation, now with new left onset of weakness and increased tone in the left arm and leg consistent with his right basal ganglia stroke.  Due to the poor history on the time of onset, the patient is not a TPA candidate. However, he has clearly had a stroke and therefore, we will admit him to the stroke service for further workup and evaluation.  Most likely etiology to his stroke, his cardioembolic given that he is actively in atrial fibrillation on anticoagulation.  However, the patient does have multiple risk factors or small vessel disease and carotid disease.  PLAN: 1. Right basal ganglia stroke.  We will obtain an MRI of the brain     without contrast,  carotid Dopplers, and transcranial Dopplers, A 2-     D echo, and place the patient on telemetry.  We will also obtain     risk factor lab including an A1c, fasting lipid profile, and a TSH.     We will keep the patient n.p.o. until he has had his stroke swallow     screen.  We will place him on aspirin today unless we can confirm     with his wife the reason as to why Coumadin was stopped previously. 2. Atrial fibrillation.  We will continue the patient's metoprolol at     this time with a slight increase in cardiac enzymes.  We will cycle     these q.6 h. 3. Hypertension.  We will continue the patient's lisinopril, Lasix,     and Norvasc. 4. Hyperlipidemia.  We will  continue the patient's pravastatin. 5. Obstructive sleep apnea.  We will continue the patient's home CPAP. 6. Fluids, electrolytes, and nutrition.  We will place the patient's     on normal saline at 75 mL per hour overnight and obtain repeat     blood work in the morning.          ______________________________ Lajuana Carry, MD     DM/MEDQ  D:  06/29/2011  T:  06/30/2011  Job:  [id number]  Electronically Signed by Lajuana Carry MD on 07/01/2011 10:46:10 AM

## 2011-07-01 NOTE — Telephone Encounter (Signed)
Opened in error

## 2011-07-02 LAB — GLUCOSE, CAPILLARY
Glucose-Capillary: 118 mg/dL — ABNORMAL HIGH (ref 70–99)
Glucose-Capillary: 118 mg/dL — ABNORMAL HIGH (ref 70–99)
Glucose-Capillary: 113 mg/dL — ABNORMAL HIGH (ref 70–99)

## 2011-07-03 ENCOUNTER — Inpatient Hospital Stay (HOSPITAL_COMMUNITY): Payer: Medicare Other

## 2011-07-03 LAB — CBC
HCT: 38.9 % — ABNORMAL LOW (ref 39.0–52.0)
MCHC: 32.4 g/dL (ref 30.0–36.0)
Platelets: 150 10*3/uL (ref 150–400)
RDW: 14.7 % (ref 11.5–15.5)

## 2011-07-03 LAB — BASIC METABOLIC PANEL
BUN: 20 mg/dL (ref 6–23)
Creatinine, Ser: 0.99 mg/dL (ref 0.50–1.35)
GFR calc Af Amer: 83 mL/min — ABNORMAL LOW (ref >90)
GFR calc non Af Amer: 71 mL/min — ABNORMAL LOW (ref >90)
Potassium: 3.6 mEq/L (ref 3.5–5.1)

## 2011-07-03 LAB — GLUCOSE, CAPILLARY: Glucose-Capillary: 168 mg/dL — ABNORMAL HIGH (ref 70–99)

## 2011-07-04 LAB — URINE MICROSCOPIC-ADD ON

## 2011-07-04 LAB — URINALYSIS, ROUTINE W REFLEX MICROSCOPIC
Bilirubin Urine: NEGATIVE
Ketones, ur: NEGATIVE mg/dL
Nitrite: POSITIVE — AB
Protein, ur: NEGATIVE mg/dL
pH: 5 (ref 5.0–8.0)

## 2011-07-04 LAB — DIFFERENTIAL
Basophils Absolute: 0 10*3/uL (ref 0.0–0.1)
Lymphocytes Relative: 14 % (ref 12–46)
Monocytes Relative: 7 % (ref 3–12)

## 2011-07-04 LAB — CBC
Hemoglobin: 12 g/dL — ABNORMAL LOW (ref 13.0–17.0)
RBC: 3.93 MIL/uL — ABNORMAL LOW (ref 4.22–5.81)
WBC: 23.6 10*3/uL — ABNORMAL HIGH (ref 4.0–10.5)

## 2011-07-04 LAB — GLUCOSE, CAPILLARY
Glucose-Capillary: 130 mg/dL — ABNORMAL HIGH (ref 70–99)
Glucose-Capillary: 141 mg/dL — ABNORMAL HIGH (ref 70–99)
Glucose-Capillary: 245 mg/dL — ABNORMAL HIGH (ref 70–99)

## 2011-07-05 LAB — CBC
Hemoglobin: 11.3 g/dL — ABNORMAL LOW (ref 13.0–17.0)
MCH: 30.5 pg (ref 26.0–34.0)
MCHC: 32.4 g/dL (ref 30.0–36.0)
MCV: 94.1 fL (ref 78.0–100.0)
Platelets: 144 10*3/uL — ABNORMAL LOW (ref 150–400)
RBC: 3.71 MIL/uL — ABNORMAL LOW (ref 4.22–5.81)

## 2011-07-05 MED ORDER — SIMVASTATIN 20 MG PO TABS
20.0000 mg | ORAL_TABLET | Freq: Every day | ORAL | Status: DC
  Administered 2011-07-06: 20 mg via ORAL
  Filled 2011-07-05 (×3): qty 1

## 2011-07-05 MED ORDER — SODIUM CHLORIDE 0.9 % IV SOLN
INTRAVENOUS | Status: DC
  Administered 2011-07-07: via INTRAVENOUS

## 2011-07-05 MED ORDER — ENOXAPARIN SODIUM 40 MG/0.4ML ~~LOC~~ SOLN
40.0000 mg | SUBCUTANEOUS | Status: DC
  Administered 2011-07-06 – 2011-07-07 (×2): 40 mg via SUBCUTANEOUS
  Filled 2011-07-05 (×2): qty 0.4

## 2011-07-05 MED ORDER — TRAMADOL HCL 50 MG PO TABS
50.0000 mg | ORAL_TABLET | Freq: Four times a day (QID) | ORAL | Status: DC | PRN
  Administered 2011-07-06 (×2): 50 mg via ORAL
  Filled 2011-07-05 (×2): qty 1

## 2011-07-05 MED ORDER — PANTOPRAZOLE SODIUM 40 MG PO TBEC
80.0000 mg | DELAYED_RELEASE_TABLET | Freq: Every day | ORAL | Status: DC
  Administered 2011-07-06 – 2011-07-07 (×2): 80 mg via ORAL
  Filled 2011-07-05 (×2): qty 1

## 2011-07-05 MED ORDER — BIOTENE DRY MOUTH MT LIQD
15.0000 mL | Freq: Two times a day (BID) | OROMUCOSAL | Status: DC
  Administered 2011-07-07: 15 mL via OROMUCOSAL

## 2011-07-05 MED ORDER — FUROSEMIDE 40 MG PO TABS
40.0000 mg | ORAL_TABLET | Freq: Every day | ORAL | Status: DC
  Administered 2011-07-06 – 2011-07-07 (×2): 40 mg via ORAL
  Filled 2011-07-05 (×3): qty 1

## 2011-07-05 MED ORDER — LABETALOL HCL 5 MG/ML IV SOLN: 10.0000 mg | INTRAVENOUS | Status: DC | PRN

## 2011-07-05 MED ORDER — AMLODIPINE BESYLATE 5 MG PO TABS
5.0000 mg | ORAL_TABLET | Freq: Every day | ORAL | Status: DC
  Administered 2011-07-06 – 2011-07-07 (×2): 5 mg via ORAL
  Filled 2011-07-05 (×3): qty 1

## 2011-07-05 MED ORDER — PIPERACILLIN-TAZOBACTAM 3.375 G IVPB
3.3750 g | Freq: Three times a day (TID) | INTRAVENOUS | Status: DC
  Administered 2011-07-06 – 2011-07-07 (×5): 3.375 g via INTRAVENOUS
  Filled 2011-07-05 (×8): qty 50

## 2011-07-05 MED ORDER — STARCH (THICKENING) PO POWD: ORAL | Status: DC | PRN

## 2011-07-05 MED ORDER — ACETAMINOPHEN 650 MG RE SUPP: 650.0000 mg | RECTAL | Status: DC | PRN

## 2011-07-05 MED ORDER — CLOPIDOGREL BISULFATE 75 MG PO TABS
75.0000 mg | ORAL_TABLET | Freq: Every day | ORAL | Status: DC
  Administered 2011-07-06 – 2011-07-07 (×2): 75 mg via ORAL
  Filled 2011-07-05 (×2): qty 1

## 2011-07-05 MED ORDER — ACETAMINOPHEN 325 MG PO TABS: 650.0000 mg | ORAL_TABLET | ORAL | Status: DC | PRN

## 2011-07-05 MED ORDER — ENSURE PUDDING PO PUDG
1.0000 | Freq: Three times a day (TID) | ORAL | Status: DC
  Administered 2011-07-06 – 2011-07-07 (×6): 1 via ORAL

## 2011-07-05 MED ORDER — ONDANSETRON HCL 4 MG/2ML IJ SOLN: 4.0000 mg | Freq: Four times a day (QID) | INTRAMUSCULAR | Status: DC | PRN

## 2011-07-05 MED ORDER — ARFORMOTEROL TARTRATE 15 MCG/2ML IN NEBU
15.0000 ug | INHALATION_SOLUTION | Freq: Two times a day (BID) | RESPIRATORY_TRACT | Status: DC
  Filled 2011-07-05 (×5): qty 2

## 2011-07-05 MED ORDER — POTASSIUM CHLORIDE CRYS ER 20 MEQ PO TBCR
20.0000 meq | EXTENDED_RELEASE_TABLET | Freq: Every day | ORAL | Status: DC
  Administered 2011-07-06 – 2011-07-07 (×2): 20 meq via ORAL
  Filled 2011-07-05: qty 1

## 2011-07-05 MED ORDER — LISINOPRIL 40 MG PO TABS
40.0000 mg | ORAL_TABLET | Freq: Every day | ORAL | Status: DC
  Administered 2011-07-06 – 2011-07-07 (×2): 40 mg via ORAL
  Filled 2011-07-05 (×3): qty 1

## 2011-07-05 MED ORDER — BUDESONIDE 0.25 MG/2ML IN SUSP
0.2500 mg | Freq: Two times a day (BID) | RESPIRATORY_TRACT | Status: DC
  Filled 2011-07-05 (×5): qty 2

## 2011-07-05 MED ORDER — METOPROLOL SUCCINATE ER 50 MG PO TB24
50.0000 mg | ORAL_TABLET | Freq: Every day | ORAL | Status: DC
  Administered 2011-07-06 – 2011-07-07 (×2): 50 mg via ORAL
  Filled 2011-07-05 (×3): qty 1

## 2011-07-05 MED ORDER — ASPIRIN EC 81 MG PO TBEC
81.0000 mg | DELAYED_RELEASE_TABLET | Freq: Every day | ORAL | Status: DC
  Administered 2011-07-06 – 2011-07-07 (×2): 81 mg via ORAL
  Filled 2011-07-05 (×3): qty 1

## 2011-07-06 DIAGNOSIS — I639 Cerebral infarction, unspecified: Secondary | ICD-10-CM | POA: Diagnosis present

## 2011-07-06 DIAGNOSIS — N390 Urinary tract infection, site not specified: Secondary | ICD-10-CM | POA: Diagnosis not present

## 2011-07-06 DIAGNOSIS — J690 Pneumonitis due to inhalation of food and vomit: Secondary | ICD-10-CM | POA: Diagnosis not present

## 2011-07-06 LAB — DIFFERENTIAL
Basophils Absolute: 0 10*3/uL (ref 0.0–0.1)
Eosinophils Absolute: 0.3 10*3/uL (ref 0.0–0.7)
Lymphocytes Relative: 22 % (ref 12–46)
Lymphs Abs: 2.4 10*3/uL (ref 0.7–4.0)
Neutrophils Relative %: 67 % (ref 43–77)

## 2011-07-06 LAB — CBC
Platelets: 159 10*3/uL (ref 150–400)
RBC: 4.12 MIL/uL — ABNORMAL LOW (ref 4.22–5.81)
WBC: 11 10*3/uL — ABNORMAL HIGH (ref 4.0–10.5)

## 2011-07-06 LAB — GLUCOSE, CAPILLARY
Glucose-Capillary: 186 mg/dL — ABNORMAL HIGH (ref 70–99)
Glucose-Capillary: 157 mg/dL — ABNORMAL HIGH (ref 70–99)
Glucose-Capillary: 164 mg/dL — ABNORMAL HIGH (ref 70–99)

## 2011-07-06 MED ORDER — BUDESONIDE 0.25 MG/2ML IN SUSP
0.2500 mg | Freq: Two times a day (BID) | RESPIRATORY_TRACT | Status: DC
  Administered 2011-07-06 – 2011-07-07 (×3): 0.25 mg via RESPIRATORY_TRACT
  Filled 2011-07-06 (×5): qty 2

## 2011-07-06 MED ORDER — ARFORMOTEROL TARTRATE 15 MCG/2ML IN NEBU
15.0000 ug | INHALATION_SOLUTION | Freq: Two times a day (BID) | RESPIRATORY_TRACT | Status: DC
  Administered 2011-07-06 – 2011-07-07 (×3): 15 ug via RESPIRATORY_TRACT
  Filled 2011-07-06 (×5): qty 2

## 2011-07-06 NOTE — Progress Notes (Signed)
  Subjective: No complaints. No events overnight.  Left leg and back pain is better.  Objective: Vital signs in last 24 hours: Blood pressure 170/76, pulse 98, temperature 97.7 F (36.5 C), temperature source Oral, resp. rate 20, height 5' 6.93" (1.7 m), weight 83 kg (182 lb 15.7 oz), SpO2 96.00%. Temp:  [97.7 F (36.5 C)-99.3 F (37.4 C)] 97.7 F (36.5 C) (11/04 0800) Pulse Rate:  [70-98] 98  (11/04 0800) Resp:  [16-20] 20  (11/04 0800) BP: (119-170)/(65-87) 170/76 mmHg (11/04 0800) SpO2:  [95 %-99 %] 96 % (11/04 0800) FiO2 (%):  [2 %] 2 % (11/03 1400) Weight:  [83 kg (182 lb 15.7 oz)] 182 lb 15.7 oz (83 kg) (11/03 1300)  Intake/Output from previous day: 11/03 0701 - 11/04 0700 In: 500 [P.O.:500] Out: -   GENERAL EXAM: Patient is in no distress  CARDIOVASCULAR: Regular rate and rhythm, no murmurs, no carotid bruits  NEUROLOGIC: MENTAL STATUS: awake, CONFUSED; DECR FLUENCY AND COMPREHENSION CRANIAL NERVE: pupils equal and reactive to light, visual fields full to confrontation, extraocular muscles intact, no nystagmus, facial sensation and strength symmetric, uvula midline, shoulder shrug symmetric, tongue midline. MOTOR: normal bulk and tone; SLIGHTLY DECR STRENGTH ON LUE AND LLE (4/5).  RIGHT SIDE FULL STRENGTH. SENSORY: normal and symmetric to light touch COORDINATION: finger-nose-finger SLOW  Lab Results:  Basename 07/06/11 0635 07/05/11 0500  WBC 11.0* 17.6*  HGB 12.5* 11.3*  HCT 39.0 34.9*  PLT 159 144*   CBG (last 3)   Basename 07/05/11 1154 07/05/11 0610 07/05/11 0003  GLUCAP 144* 126* 166*      Medications:  Scheduled:   . amLODipine  5 mg Oral Daily  . antiseptic oral rinse  15 mL Mouth Rinse BID  . arformoterol  15 mcg Nebulization BID  . aspirin EC  81 mg Oral Daily  . budesonide  0.25 mg Nebulization BID  . clopidogrel  75 mg Oral Q breakfast  . enoxaparin (LOVENOX) injection  40 mg Subcutaneous Q24H  . feeding supplement  1 Container Oral TID  WC  . furosemide  40 mg Oral Daily  . lisinopril  40 mg Oral Daily  . metoprolol succinate  50 mg Oral Daily  . pantoprazole  80 mg Oral Q1200  . piperacillin-tazobactam (ZOSYN)  IV  3.375 g Intravenous Q8H  . potassium chloride  20 mEq Oral Daily  . simvastatin  20 mg Oral q1800    Assessment/Plan: Patient Active Hospital Problem List: Stroke, small vessel, RIGHT PONTINE (07/06/2011)   Assessment: stable    Plan: continue aspirin, plavix, BP control, statin; placement to SNF  Atrial Fibrillation   Assessment: rate controlled   PLAN: not coumadin candidate due to fall risk; continue dual antiplatelet  Mild dementia   PLAN: observation; outpatient follow up  Aspiration pneumonia (07/06/2011)   Assessment: stable; WBC improving   Plan: zosyn  UTI (urinary tract infection) (07/06/2011)   Assessment: stable   Plan: zosyn   LOS: 7 days   PENUMALLI,VIKRAM 07/06/2011, 10:04 AM

## 2011-07-07 LAB — DIFFERENTIAL
Eosinophils Relative: 4 % (ref 0–5)
Lymphocytes Relative: 28 % (ref 12–46)
Monocytes Absolute: 0.7 10*3/uL (ref 0.1–1.0)
Monocytes Relative: 7 % (ref 3–12)
Neutro Abs: 6.5 10*3/uL (ref 1.7–7.7)

## 2011-07-07 LAB — GLUCOSE, CAPILLARY
Glucose-Capillary: 144 mg/dL — ABNORMAL HIGH (ref 70–99)
Glucose-Capillary: 125 mg/dL — ABNORMAL HIGH (ref 70–99)

## 2011-07-07 LAB — CBC
HCT: 40.5 % (ref 39.0–52.0)
Hemoglobin: 12.8 g/dL — ABNORMAL LOW (ref 13.0–17.0)
MCV: 94.8 fL (ref 78.0–100.0)
RDW: 14.6 % (ref 11.5–15.5)
WBC: 10.6 10*3/uL — ABNORMAL HIGH (ref 4.0–10.5)

## 2011-07-07 LAB — BASIC METABOLIC PANEL
BUN: 26 mg/dL — ABNORMAL HIGH (ref 6–23)
CO2: 27 mEq/L (ref 19–32)
Calcium: 9.7 mg/dL (ref 8.4–10.5)
Chloride: 100 mEq/L (ref 96–112)
Creatinine, Ser: 0.98 mg/dL (ref 0.50–1.35)
Glucose, Bld: 146 mg/dL — ABNORMAL HIGH (ref 70–99)

## 2011-07-07 MED ORDER — MOXIFLOXACIN HCL 400 MG PO TABS: 400.0000 mg | ORAL_TABLET | Freq: Every day | ORAL | Status: AC

## 2011-07-07 MED ORDER — ASPIRIN 81 MG PO TBEC: 81.0000 mg | DELAYED_RELEASE_TABLET | Freq: Every day | ORAL | Status: DC

## 2011-07-07 MED ORDER — STARCH (THICKENING) PO POWD: ORAL | Status: DC

## 2011-07-07 MED ORDER — ENSURE PUDDING PO PUDG: 1.0000 | Freq: Three times a day (TID) | ORAL | Status: DC

## 2011-07-07 MED ORDER — CLOPIDOGREL BISULFATE 75 MG PO TABS: 75.0000 mg | ORAL_TABLET | Freq: Every day | ORAL | Status: DC

## 2011-07-07 NOTE — Progress Notes (Signed)
Pt is ready for discharge to [facility]. [employer] has approved both SNF and [facility]. SNF is ready to accept pt and has discharge summary. [facility] will be providing transportation to facility. Pt and family are aware and agreeable to discharge plan. CSW signing off.   Dede Query, MSW, [facility] [phone]

## 2011-07-07 NOTE — Progress Notes (Signed)
PT Note  Pt with current d/c SNF orders. Spoke with SW and pt to transfer to [facility] today. Will defer further PT to SNF.

## 2011-07-07 NOTE — Discharge Summary (Signed)
Physician Discharge Summary  Patient ID: [name] MRN: [id number] DOB/AGE: [birth year] 77 y.o.  Admit date: 06/29/2011 Discharge date: 07/07/2011  Discharge Diagnoses:  Principal Problem:  *Stroke, small vessel, RIGHT PONTINE Active Problems:  HYPERLIPIDEMIA  HYPERTENSION  Aspiration pneumonia  UTI (urinary tract infection) Atrial Fibrillation CABG CHF  BMI: Body mass index is 28.72 kg/(m^2).   Discharge Medications: Current Discharge Medication List    START taking these medications   Details  aspirin EC 81 MG EC tablet Take 1 tablet (81 mg total) by mouth daily. Qty: 30 tablet, Refills: 11    clopidogrel (PLAVIX) 75 MG tablet Take 1 tablet (75 mg total) by mouth daily with breakfast. Qty: 30 tablet, Refills: 2    feeding supplement (ENSURE) PUDG Take 1 Container by mouth 3 (three) times daily with meals. Qty: 90 Can, Refills: 11    food thickener (THICK IT) POWD Use amount for nectar thick consistency as needed Qty: 1 Can, Refills: 2    moxifloxacin (AVELOX) 400 MG tablet Take 1 tablet (400 mg total) by mouth daily. Qty: 7 tablet, Refills: 0      CONTINUE these medications which have NOT CHANGED   Details  amLODipine (NORVASC) 5 MG tablet Take 5 mg by mouth daily.      arformoterol (BROVANA) 15 MCG/2ML NEBU Take 15 mcg by nebulization 2 (two) times daily.     budesonide (PULMICORT) 0.25 MG/2ML nebulizer solution Take 0.25 mg by nebulization 2 (two) times daily.      esomeprazole (NEXIUM) 40 MG capsule Take 40 mg by mouth daily before breakfast.      furosemide (LASIX) 40 MG tablet Take 40 mg by mouth every other day.     lisinopril (PRINIVIL,ZESTRIL) 40 MG tablet Take 40 mg by mouth daily.      metoprolol (TOPROL-XL) 50 MG 24 hr tablet Take 50 mg by mouth daily.      Multiple Vitamin (MULTIVITAMIN) tablet Take 1 tablet by mouth daily.     NON FORMULARY CPAP and O2 2 liters at bedtime, sleeping  O2 4 liters nasal canula with activity     potassium  chloride SA (K-DUR,KLOR-CON) 20 MEQ tablet Take 1 tablet (20 mEq total) by mouth daily. Qty: 30 tablet, Refills: 11    pravastatin (PRAVACHOL) 40 MG tablet Take 40 mg by mouth at bedtime.     rivastigmine (EXELON) 4.6 mg/24hr Place 1 patch onto the skin daily.        STOP taking these medications     aspirin 325 MG tablet         Discharge Diet: Dysphagia 2, nectar thick liquids  Studies Performed: CT of the head on admission was normal. MRI of the brain show a R pontine infarct. MRA of the brain show decreased flow in the distal basilar, L VA diseased and hypoplastic. Carotid Dopplers shows no ICA stenosis. 2D echo shows EF 55-65%, no source of embolus.  EKG shows atrial fibrillation, pulmonary disease patter, incomplete right bundle branch block, left anterior fasicular block, septal infarct, age undetermined. Transcranial doppler limited study due to poor bony windows, low normal mean flow velocities. Globally elevated pulsatility indices suggest diffuse intracranial atherosclerosis   Lab Results: ; Lab Results  Component Value Date   CHOL 135 06/30/2011   HDL 56 06/30/2011   LDLCALC 68 06/30/2011   TRIG 56 06/30/2011   CHOLHDL 2.4 06/30/2011  ;  Lab Results  Component Value Date   HGBA1C 6.1* 06/29/2011  ; CBC  Component Value Date/Time   WBC 10.6* 07/07/2011 0735   RBC 4.27 07/07/2011 0735   HGB 12.8* 07/07/2011 0735   HCT 40.5 07/07/2011 0735   PLT 177 07/07/2011 0735   MCV 94.8 07/07/2011 0735   MCH 30.0 07/07/2011 0735   MCHC 31.6 07/07/2011 0735   RDW 14.6 07/07/2011 0735   LYMPHSABS 2.9 07/07/2011 0735   MONOABS 0.7 07/07/2011 0735   EOSABS 0.4 07/07/2011 0735   BASOSABS 0.0 07/07/2011 0735   ; CMP     Component Value Date/Time   NA 139 07/07/2011 0735   K 3.9 07/07/2011 0735   CL 100 07/07/2011 0735   CO2 27 07/07/2011 0735   GLUCOSE 146* 07/07/2011 0735   BUN 26* 07/07/2011 0735   CREATININE 0.98 07/07/2011 0735   CALCIUM 9.7 07/07/2011 0735   PROT 7.1 06/29/2011  1917   ALBUMIN 4.1 06/29/2011 1917   AST 19 06/29/2011 1917   ALT 16 06/29/2011 1917   ALKPHOS 90 06/29/2011 1917   BILITOT 0.7 06/29/2011 1917   GFRNONAA 72* 07/07/2011 0735   GFRAA 83* 07/07/2011 0735     History of Present Illness:  Mr. Strole is an 77 year old male with a history of a fib who presented tonight with acute onset of left-sided weakness. The patient was at home, this morning when he is in his typical state of health. The patient states that around 2 p.m. He started experiencing troubles but he cannot elaborate on this further. Later in the afternoon, the patient's wife laid down for a map and when she woke up, she found him having difficulty with speech and weakness on the left side of his body. Due to this, EMS was called and the patient was transported here. A code stroke was initiated on arrival but was canceled due to the unknown type of onset. The patient was not a tpa candidate due to delay in arrival. He was admitted to the hospital for further evaluation.   Hospital Course: MRI confirmed a Right pontine infarct. His infarct was secondary to small vessel disease. He was on ASA 325 mg daily prior to admission. He was changed to ASA 81 and Plavix for secondary stroke prevention secondary to his afib. He is not a coumadin candidate due to risk of falls. He has associated dysphagia and L hemiparesis secondary to the stroke.  In hospital, patient developed leukocytosis due to presumed aspiration pneumonia. He  was started on Zosyn with good results. WBC have dropped from max of 23.6 to 10.6.  PT, OT & ST evaluated patient. All recommended rehab. His family is unable to provide care at discharge. Social worker obtained SNF bed for discharge for continuing therapies.   Consults: rehabilitation medicine  Discharge Exam: Blood pressure 124/66, pulse 74, temperature 98.3 F (36.8 C), temperature source Oral, resp. rate 18, height 5' 6.93" (1.7 m), weight 83 kg (182 lb 15.7 oz),  SpO2 100.00%. General appearance: alert and cooperative, follows simple commands only Eye movements full Left facial weakness Resp: clear to auscultation bilaterally Cardio: irregularly irregular rhythm Neurologic: LUE 2/5 strength with purposeful movements on the R.  Disposition:  discharge to SNF for ongoing PT, OT & ST   ASA 81 and Plavix for secondary stroke prevention   No coumadin due to fall risk   Follow up Dr. Pearlean Brownie in 2 months.  Discharge Orders    Future Appointments: Provider: Department: Dept Phone: Center:   09/25/2011 11:00 AM Elberta Fortis [facility] [phone] [facility]  Signed: Remona Boom 07/07/2011, 1:10 PM

## 2011-07-14 ENCOUNTER — Telehealth: Payer: Self-pay | Admitting: Cardiology

## 2011-07-16 ENCOUNTER — Telehealth: Payer: Self-pay | Admitting: *Deleted

## 2011-07-16 NOTE — Telephone Encounter (Signed)
Returning pt wife call. LMTCB Mylo Red RN

## 2011-08-17 ENCOUNTER — Emergency Department (HOSPITAL_COMMUNITY): Payer: Medicare Other

## 2011-08-17 ENCOUNTER — Inpatient Hospital Stay (HOSPITAL_COMMUNITY)
Admission: EM | Admit: 2011-08-17 | Discharge: 2011-08-23 | DRG: 189 | Disposition: A | Discharge status: Skilled Nursing Facility | Payer: Medicare Other | Source: Ambulatory Visit | Attending: Internal Medicine | Admitting: Internal Medicine

## 2011-08-17 ENCOUNTER — Other Ambulatory Visit: Payer: Self-pay

## 2011-08-17 ENCOUNTER — Encounter (HOSPITAL_COMMUNITY): Payer: Self-pay

## 2011-08-17 DIAGNOSIS — I639 Cerebral infarction, unspecified: Secondary | ICD-10-CM

## 2011-08-17 DIAGNOSIS — J8410 Pulmonary fibrosis, unspecified: Secondary | ICD-10-CM

## 2011-08-17 DIAGNOSIS — G4733 Obstructive sleep apnea (adult) (pediatric): Secondary | ICD-10-CM | POA: Diagnosis present

## 2011-08-17 DIAGNOSIS — J690 Pneumonitis due to inhalation of food and vomit: Secondary | ICD-10-CM

## 2011-08-17 DIAGNOSIS — Z8673 Personal history of transient ischemic attack (TIA), and cerebral infarction without residual deficits: Secondary | ICD-10-CM

## 2011-08-17 DIAGNOSIS — E871 Hypo-osmolality and hyponatremia: Secondary | ICD-10-CM | POA: Diagnosis present

## 2011-08-17 DIAGNOSIS — J61 Pneumoconiosis due to asbestos and other mineral fibers: Secondary | ICD-10-CM | POA: Diagnosis present

## 2011-08-17 DIAGNOSIS — R404 Transient alteration of awareness: Secondary | ICD-10-CM | POA: Diagnosis not present

## 2011-08-17 DIAGNOSIS — R609 Edema, unspecified: Secondary | ICD-10-CM

## 2011-08-17 DIAGNOSIS — J208 Acute bronchitis due to other specified organisms: Secondary | ICD-10-CM | POA: Diagnosis present

## 2011-08-17 DIAGNOSIS — E869 Volume depletion, unspecified: Secondary | ICD-10-CM | POA: Diagnosis present

## 2011-08-17 DIAGNOSIS — M48061 Spinal stenosis, lumbar region without neurogenic claudication: Secondary | ICD-10-CM

## 2011-08-17 DIAGNOSIS — R413 Other amnesia: Secondary | ICD-10-CM | POA: Diagnosis present

## 2011-08-17 DIAGNOSIS — R0902 Hypoxemia: Secondary | ICD-10-CM

## 2011-08-17 DIAGNOSIS — Z951 Presence of aortocoronary bypass graft: Secondary | ICD-10-CM

## 2011-08-17 DIAGNOSIS — I679 Cerebrovascular disease, unspecified: Secondary | ICD-10-CM

## 2011-08-17 DIAGNOSIS — J209 Acute bronchitis, unspecified: Secondary | ICD-10-CM | POA: Diagnosis present

## 2011-08-17 DIAGNOSIS — F0390 Unspecified dementia without behavioral disturbance: Secondary | ICD-10-CM | POA: Diagnosis present

## 2011-08-17 DIAGNOSIS — Z7901 Long term (current) use of anticoagulants: Secondary | ICD-10-CM

## 2011-08-17 DIAGNOSIS — D696 Thrombocytopenia, unspecified: Secondary | ICD-10-CM | POA: Diagnosis present

## 2011-08-17 DIAGNOSIS — R0603 Acute respiratory distress: Secondary | ICD-10-CM

## 2011-08-17 DIAGNOSIS — Z87891 Personal history of nicotine dependence: Secondary | ICD-10-CM

## 2011-08-17 DIAGNOSIS — I2510 Atherosclerotic heart disease of native coronary artery without angina pectoris: Secondary | ICD-10-CM | POA: Diagnosis present

## 2011-08-17 DIAGNOSIS — I4891 Unspecified atrial fibrillation: Secondary | ICD-10-CM | POA: Diagnosis present

## 2011-08-17 DIAGNOSIS — J449 Chronic obstructive pulmonary disease, unspecified: Secondary | ICD-10-CM | POA: Diagnosis present

## 2011-08-17 DIAGNOSIS — E785 Hyperlipidemia, unspecified: Secondary | ICD-10-CM

## 2011-08-17 DIAGNOSIS — N390 Urinary tract infection, site not specified: Secondary | ICD-10-CM | POA: Diagnosis present

## 2011-08-17 DIAGNOSIS — J849 Interstitial pulmonary disease, unspecified: Secondary | ICD-10-CM

## 2011-08-17 DIAGNOSIS — I739 Peripheral vascular disease, unspecified: Secondary | ICD-10-CM

## 2011-08-17 DIAGNOSIS — J9601 Acute respiratory failure with hypoxia: Secondary | ICD-10-CM | POA: Diagnosis present

## 2011-08-17 DIAGNOSIS — E861 Hypovolemia: Secondary | ICD-10-CM | POA: Diagnosis not present

## 2011-08-17 DIAGNOSIS — I959 Hypotension, unspecified: Secondary | ICD-10-CM | POA: Diagnosis present

## 2011-08-17 DIAGNOSIS — J309 Allergic rhinitis, unspecified: Secondary | ICD-10-CM

## 2011-08-17 DIAGNOSIS — J440 Chronic obstructive pulmonary disease with acute lower respiratory infection: Secondary | ICD-10-CM | POA: Diagnosis present

## 2011-08-17 DIAGNOSIS — D72829 Elevated white blood cell count, unspecified: Secondary | ICD-10-CM | POA: Diagnosis present

## 2011-08-17 DIAGNOSIS — I10 Essential (primary) hypertension: Secondary | ICD-10-CM | POA: Diagnosis present

## 2011-08-17 DIAGNOSIS — I498 Other specified cardiac arrhythmias: Secondary | ICD-10-CM | POA: Diagnosis not present

## 2011-08-17 DIAGNOSIS — T380X5A Adverse effect of glucocorticoids and synthetic analogues, initial encounter: Secondary | ICD-10-CM | POA: Diagnosis present

## 2011-08-17 DIAGNOSIS — J4489 Other specified chronic obstructive pulmonary disease: Secondary | ICD-10-CM | POA: Diagnosis present

## 2011-08-17 DIAGNOSIS — J9620 Acute and chronic respiratory failure, unspecified whether with hypoxia or hypercapnia: Principal | ICD-10-CM | POA: Diagnosis present

## 2011-08-17 DIAGNOSIS — R410 Disorientation, unspecified: Secondary | ICD-10-CM | POA: Diagnosis not present

## 2011-08-17 HISTORY — DX: Pneumonia, unspecified organism: J18.9

## 2011-08-17 LAB — URINALYSIS, ROUTINE W REFLEX MICROSCOPIC
Bilirubin Urine: NEGATIVE
Glucose, UA: NEGATIVE mg/dL
Hgb urine dipstick: NEGATIVE
Ketones, ur: NEGATIVE mg/dL
Nitrite: NEGATIVE
Specific Gravity, Urine: 1.026 (ref 1.005–1.030)
pH: 5 (ref 5.0–8.0)

## 2011-08-17 LAB — URINE MICROSCOPIC-ADD ON

## 2011-08-17 LAB — DIFFERENTIAL
Eosinophils Absolute: 0.3 10*3/uL (ref 0.0–0.7)
Lymphocytes Relative: 38 % (ref 12–46)
Lymphs Abs: 3.2 10*3/uL (ref 0.7–4.0)
Neutro Abs: 4.2 10*3/uL (ref 1.7–7.7)
Neutrophils Relative %: 50 % (ref 43–77)

## 2011-08-17 LAB — CBC
Hemoglobin: 11.4 g/dL — ABNORMAL LOW (ref 13.0–17.0)
MCH: 30.7 pg (ref 26.0–34.0)
Platelets: 133 10*3/uL — ABNORMAL LOW (ref 150–400)
RBC: 3.71 MIL/uL — ABNORMAL LOW (ref 4.22–5.81)
WBC: 8.4 10*3/uL (ref 4.0–10.5)

## 2011-08-17 LAB — COMPREHENSIVE METABOLIC PANEL
ALT: 28 U/L (ref 0–53)
Alkaline Phosphatase: 90 U/L (ref 39–117)
Chloride: 107 mEq/L (ref 96–112)
GFR calc Af Amer: 87 mL/min — ABNORMAL LOW (ref >90)
Glucose, Bld: 126 mg/dL — ABNORMAL HIGH (ref 70–99)
Potassium: 4.2 mEq/L (ref 3.5–5.1)
Sodium: 140 mEq/L (ref 135–145)
Total Bilirubin: 0.2 mg/dL — ABNORMAL LOW (ref 0.3–1.2)
Total Protein: 6.5 g/dL (ref 6.0–8.3)

## 2011-08-17 LAB — CARDIAC PANEL(CRET KIN+CKTOT+MB+TROPI)
CK, MB: 3.3 ng/mL (ref 0.3–4.0)
Troponin I: <0.3 ng/mL (ref <0.30)

## 2011-08-17 LAB — PRO B NATRIURETIC PEPTIDE: Pro B Natriuretic peptide (BNP): 887.6 pg/mL — ABNORMAL HIGH (ref 0–450)

## 2011-08-17 LAB — LACTIC ACID, PLASMA: Lactic Acid, Venous: 1.2 mmol/L (ref 0.5–2.2)

## 2011-08-17 MED ORDER — ALBUTEROL (5 MG/ML) CONTINUOUS INHALATION SOLN
10.0000 mg/h | INHALATION_SOLUTION | RESPIRATORY_TRACT | Status: DC
  Administered 2011-08-17: 10 mg/h via RESPIRATORY_TRACT
  Filled 2011-08-17: qty 20

## 2011-08-17 MED ORDER — METOPROLOL TARTRATE 25 MG PO TABS
25.0000 mg | ORAL_TABLET | Freq: Two times a day (BID) | ORAL | Status: DC
  Administered 2011-08-18 (×2): 25 mg via ORAL
  Filled 2011-08-17 (×4): qty 1

## 2011-08-17 MED ORDER — ASPIRIN 81 MG PO CHEW
81.0000 mg | CHEWABLE_TABLET | Freq: Every day | ORAL | Status: DC
  Administered 2011-08-18 – 2011-08-23 (×6): 81 mg via ORAL
  Filled 2011-08-17 (×6): qty 1

## 2011-08-17 MED ORDER — FUROSEMIDE 40 MG PO TABS
40.0000 mg | ORAL_TABLET | ORAL | Status: DC
  Administered 2011-08-18: 40 mg via ORAL
  Filled 2011-08-17: qty 1

## 2011-08-17 MED ORDER — IPRATROPIUM BROMIDE 0.02 % IN SOLN
0.5000 mg | Freq: Once | RESPIRATORY_TRACT | Status: AC
  Administered 2011-08-17: 0.5 mg via RESPIRATORY_TRACT
  Filled 2011-08-17: qty 2.5

## 2011-08-17 MED ORDER — AMLODIPINE BESYLATE 5 MG PO TABS
5.0000 mg | ORAL_TABLET | Freq: Every day | ORAL | Status: DC
  Administered 2011-08-18: 5 mg via ORAL
  Filled 2011-08-17 (×2): qty 1

## 2011-08-17 MED ORDER — ALBUTEROL SULFATE (5 MG/ML) 0.5% IN NEBU
2.5000 mg | INHALATION_SOLUTION | Freq: Four times a day (QID) | RESPIRATORY_TRACT | Status: DC
  Administered 2011-08-18 (×3): 2.5 mg via RESPIRATORY_TRACT
  Filled 2011-08-17 (×3): qty 0.5

## 2011-08-17 MED ORDER — DONEPEZIL HCL 5 MG PO TABS
5.0000 mg | ORAL_TABLET | Freq: Every day | ORAL | Status: DC
  Administered 2011-08-18 – 2011-08-21 (×4): 5 mg via ORAL
  Filled 2011-08-17 (×5): qty 1

## 2011-08-17 MED ORDER — SIMVASTATIN 20 MG PO TABS
20.0000 mg | ORAL_TABLET | Freq: Every day | ORAL | Status: DC
  Administered 2011-08-18 – 2011-08-22 (×5): 20 mg via ORAL
  Filled 2011-08-17 (×6): qty 1

## 2011-08-17 MED ORDER — LEVOFLOXACIN 500 MG PO TABS
500.0000 mg | ORAL_TABLET | Freq: Every day | ORAL | Status: DC
  Administered 2011-08-18 – 2011-08-23 (×6): 500 mg via ORAL
  Filled 2011-08-17 (×6): qty 1

## 2011-08-17 MED ORDER — ENOXAPARIN SODIUM 40 MG/0.4ML ~~LOC~~ SOLN
40.0000 mg | Freq: Every day | SUBCUTANEOUS | Status: DC
  Administered 2011-08-18 – 2011-08-20 (×3): 40 mg via SUBCUTANEOUS
  Filled 2011-08-17 (×3): qty 0.4

## 2011-08-17 MED ORDER — ALBUTEROL SULFATE (5 MG/ML) 0.5% IN NEBU: 2.5000 mg | INHALATION_SOLUTION | RESPIRATORY_TRACT | Status: DC

## 2011-08-17 MED ORDER — CLOPIDOGREL BISULFATE 75 MG PO TABS
75.0000 mg | ORAL_TABLET | Freq: Every day | ORAL | Status: DC
  Administered 2011-08-18 – 2011-08-21 (×4): 75 mg via ORAL
  Filled 2011-08-17 (×5): qty 1

## 2011-08-17 MED ORDER — BIOTENE DRY MOUTH MT LIQD
15.0000 mL | Freq: Two times a day (BID) | OROMUCOSAL | Status: DC
  Administered 2011-08-18 – 2011-08-20 (×5): 15 mL via OROMUCOSAL

## 2011-08-17 MED ORDER — METHYLPREDNISOLONE SODIUM SUCC 40 MG IJ SOLR
40.0000 mg | Freq: Two times a day (BID) | INTRAMUSCULAR | Status: DC
  Administered 2011-08-18 (×2): 40 mg via INTRAVENOUS
  Filled 2011-08-17 (×3): qty 1

## 2011-08-17 MED ORDER — ALBUTEROL SULFATE (5 MG/ML) 0.5% IN NEBU
INHALATION_SOLUTION | RESPIRATORY_TRACT | Status: AC
  Filled 2011-08-17: qty 2

## 2011-08-17 MED ORDER — SODIUM CHLORIDE 0.9 % IV SOLN: 250.0000 mL | INTRAVENOUS | Status: DC | PRN

## 2011-08-17 MED ORDER — IPRATROPIUM BROMIDE 0.02 % IN SOLN
0.5000 mg | RESPIRATORY_TRACT | Status: DC
  Administered 2011-08-18 (×5): 0.5 mg via RESPIRATORY_TRACT
  Filled 2011-08-17 (×5): qty 2.5

## 2011-08-17 MED ORDER — LISINOPRIL 40 MG PO TABS
40.0000 mg | ORAL_TABLET | Freq: Every day | ORAL | Status: DC
  Administered 2011-08-18: 40 mg via ORAL
  Filled 2011-08-17 (×2): qty 1

## 2011-08-17 MED ORDER — IPRATROPIUM BROMIDE 0.02 % IN SOLN
0.5000 mg | RESPIRATORY_TRACT | Status: DC
  Filled 2011-08-17: qty 2.5

## 2011-08-17 MED ORDER — PANTOPRAZOLE SODIUM 40 MG IV SOLR
40.0000 mg | Freq: Every day | INTRAVENOUS | Status: DC
  Administered 2011-08-18 – 2011-08-21 (×4): 40 mg via INTRAVENOUS
  Filled 2011-08-17 (×4): qty 40

## 2011-08-17 NOTE — ED Notes (Signed)
Pt moved to sitting position to eat.

## 2011-08-17 NOTE — ED Notes (Signed)
Called Company secretary.  She stated the pt will most likely be here until tomorrow am or afternoon.  Family to be notified.

## 2011-08-17 NOTE — ED Notes (Signed)
Respiratory called and they will meet pt to give hour long nebx upstairs.

## 2011-08-17 NOTE — ED Notes (Signed)
Pulmonary called for Diet.  Pt given meal tray.  Tolerating well.

## 2011-08-17 NOTE — ED Notes (Signed)
Per [facility] pt from home, [facility] CNA found pt today w/sob, rales in all lung fields and congested unable to cough anything up or talk, talking w/1 word phrases, 88% ra, pt place CPAP @ 10 L/min w/O2 sats increasing to 100%, pt then able to answer questions and communicate thoroughly. 12 lead showed A-fib w/slow ventricular response, EDP Webb at bedside. 20g LFA wrapped w/gauze

## 2011-08-17 NOTE — ED Notes (Signed)
Bi-pap removed per verbal order of pulmonologist.  Pt tolerating well.  Maintaining sats of 98% on RA.

## 2011-08-17 NOTE — ED Provider Notes (Signed)
History     CSN: [id number] Arrival date & time: 08/17/2011  4:28 PM   First MD Initiated Contact with Patient 08/17/11 1630      Chief Complaint  Patient presents with  . Respiratory Distress    Level 5 caveat 2/2 Bipap, tachypnea  HPI  89yoM h/o asbestos exposire, severe AS, CHF, CAD s/p CABG, on plavix pw shortness of breath. Per family pt undergoing rehab at this time. This afternoon pt with diffuse wheezing. Family/home health called EMS. Per EMS pt hypoxic and in respiratory distress on arrival but able to speak in complete sentences.  Pt currently being treated for UTI, taking ciprofloxacin     Gerarda Fraction, RN 08/17/2011 16:35     Per [facility] pt from home, [facility] CNA found pt today w/sob, rales in all lung fields and congested unable to cough anything up or talk, talking w/1 word phrases, 88% ra, pt place CPAP @ 10 L/min w/O2 sats increasing to 100%, pt then able to answer questions and communicate thoroughly. 12 lead showed A-fib w/slow ventricular response, EDP Amiir Heckard at bedside. 20g LFA wrapped w/gauze     Past Medical History  Diagnosis Date  . Encounter for long-term (current) use of anticoagulants 12/09/2010  . Hypoxemia 05/15/2009  . Atrial fibrillation 01/19/2009  . CORONARY ARTERY DISEASE 10/07/2007  . AORTIC STENOSIS, SEVERE 10/07/2007  . PVD 10/07/2007  . HYPERLIPIDEMIA 10/07/2007  . HYPERTENSION 10/07/2007  . CEREBROVASCULAR DISEASE 10/07/2007  . DYSPNEA 02/14/2008  . OBSTRUCTIVE SLEEP APNEA 10/07/2007  . Asbestosis 02/21/2008  . History of colon cancer   . DDD (degenerative disc disease)     low back  . Colon polyp   . Pneumonia     Past Surgical History  Procedure Date  . Abdominal aortic aneurysm repair 1992  . Coronary artery bypass graft 2005  . Aortic valve replacement 2005  . Knee surgery     left  . Eye surgery     Family History  Problem Relation Age of Onset  . COPD    . Colon cancer    . Lung cancer    . Heart disease    . Coronary artery  disease    . Stroke Mother 30  . Heart disease Father     History  Substance Use Topics  . Smoking status: Former Smoker -- 1.0 packs/day for 20 years    Types: Cigarettes    Quit date: 12/16/1973  . Smokeless tobacco: Never Used  . Alcohol Use: No      Review of Systems  All other systems reviewed and are negative.   except as noted HPI   Allergies  Morphine and Ultram  Home Medications   Current Outpatient Rx  Name Route Sig Dispense Refill  . AMLODIPINE BESYLATE 5 MG PO TABS Oral Take 5 mg by mouth daily.      . ARFORMOTEROL TARTRATE 15 MCG/2ML IN NEBU Nebulization Take 15 mcg by nebulization 2 (two) times daily.     . ASPIRIN 81 MG PO TBEC Oral Take 81 mg by mouth daily.      . BUDESONIDE 0.25 MG/2ML IN SUSP Nebulization Take 0.25 mg by nebulization 2 (two) times daily.      Marland Kitchen CIPROFLOXACIN HCL 250 MG PO TABS Oral Take 250 mg by mouth 2 (two) times daily. For 10 days.  Started about a week ago.     Marland Kitchen CLOPIDOGREL BISULFATE 75 MG PO TABS Oral Take 75 mg by mouth daily with breakfast.      .  DONEPEZIL HCL 5 MG PO TABS Oral Take 5 mg by mouth daily.      . FUROSEMIDE 40 MG PO TABS Oral Take 40 mg by mouth every other day.     Marland Kitchen LANSOPRAZOLE 30 MG PO CPDR Oral Take 30 mg by mouth daily.      Marland Kitchen LISINOPRIL 40 MG PO TABS Oral Take 40 mg by mouth daily.      Marland Kitchen METOPROLOL TARTRATE 25 MG PO TABS Oral Take 25 mg by mouth daily.      Marland Kitchen ONE-DAILY MULTI VITAMINS PO TABS Oral Take 1 tablet by mouth daily.     Marland Kitchen POTASSIUM CHLORIDE 10 MEQ PO TBCR Oral Take 20 mEq by mouth daily.      Marland Kitchen PRAVASTATIN SODIUM 40 MG PO TABS Oral Take 40 mg by mouth at bedtime.     Marland Kitchen PROBIOTIC PO Oral Take 1 tablet by mouth daily.        BP 111/48  Pulse 65  Temp(Src) 97.9 F (36.6 C) (Oral)  Resp 18  SpO2 100%  Physical Exam  Nursing note and vitals reviewed. Constitutional: He is oriented to person, place, and time. He appears well-developed and well-nourished. No distress.  HENT:  Head:  Atraumatic.       Mm dry  Eyes: Conjunctivae are normal. Pupils are equal, round, and reactive to light.  Neck: Neck supple.  Cardiovascular: Normal rate, regular rhythm, normal heart sounds and intact distal pulses.  Exam reveals no gallop and no friction rub.   No murmur heard. Pulmonary/Chest: He is in respiratory distress. He has wheezes. He has no rales.       Diffuse wheezing, bibasilar rales  Abdominal: Soft. Bowel sounds are normal. There is no tenderness. There is no rebound and no guarding.  Musculoskeletal: Normal range of motion. He exhibits no edema and no tenderness.  Neurological: He is alert and oriented to person, place, and time.  Skin: Skin is warm and dry.  Psychiatric: He has a normal mood and affect.    Date: 08/17/2011  Rate: 45  Rhythm: atrial fibrillation  QRS Axis: normal  Intervals: indet  ST/T Wave abnormalities: normal  Conduction Disutrbances:none  Narrative Interpretation: incomplete RBBB, consider anteroseptal infarct  Old EKG Reviewed: changes noted slower v rate  CRITICAL CARE Performed by: Forbes Cellar   Total critical care time:  Critical care time was exclusive of separately billable procedures and treating other patients.  Critical care was necessary to treat or prevent imminent or life-threatening deterioration.  Critical care was time spent personally by me on the following activities: development of treatment plan with patient and/or surrogate as well as nursing, discussions with consultants, evaluation of patient's response to treatment, examination of patient, obtaining history from patient or surrogate, ordering and performing treatments and interventions, ordering and review of laboratory studies, ordering and review of radiographic studies, pulse oximetry and re-evaluation of patient's condition.   ED Course  Procedures (including critical care time)  Labs Reviewed  CBC - Abnormal; Notable for the following:    RBC 3.71  (*)    Hemoglobin 11.4 (*)    HCT 34.6 (*)    Platelets 133 (*)    All other components within normal limits  COMPREHENSIVE METABOLIC PANEL - Abnormal; Notable for the following:    Glucose, Bld 126 (*)    BUN 29 (*)    Albumin 2.9 (*)    Total Bilirubin 0.2 (*)    GFR calc non Af Amer 75 (*)  GFR calc Af Amer 87 (*)    All other components within normal limits  PRO B NATRIURETIC PEPTIDE - Abnormal; Notable for the following:    Pro B Natriuretic peptide (BNP) 887.6 (*)    All other components within normal limits  DIFFERENTIAL  MAGNESIUM  PHOSPHORUS  CARDIAC PANEL(CRET KIN+CKTOT+MB+TROPI)  LACTIC ACID, PLASMA  CULTURE, BLOOD (ROUTINE X 2)  CULTURE, BLOOD (ROUTINE X 2)  URINALYSIS, ROUTINE W REFLEX MICROSCOPIC   Dg Chest Portable 1 View  08/17/2011  *RADIOLOGY REPORT*  Clinical Data: Cough, congestion, shortness of breath  PORTABLE CHEST - 1 VIEW  Comparison: 07/03/2011  Findings: Chronic interstitial markings with calcified pleural plaques.  No focal consolidation or frank interstitial edema. No pleural effusion or pneumothorax.  Cardiomegaly.  IMPRESSION: No evidence of acute cardiopulmonary disease.  Cardiomegaly with chronic interstitial markings and calcified pleural plaques.  Original Report Authenticated By: Charline Bills, M.D.     1. Respiratory distress     MDM  Moderate respiratory distress. DDx incl CHF/AS, Acute exacerbation of chronic lung disease, PE. Doing well on bipap. CXR, labs, albuterol, reassess. Anticipate admission to PCCM.  Labs reviewed and remarkable for elevated pro BNP from baseline.   Pt has been evaluated by PCCM in the ED. Admitted for further w/u and evaluation. Off Bipap at this time      Forbes Cellar, MD 08/17/11 2028

## 2011-08-17 NOTE — H&P (Signed)
Patient name: [name] Medical record number: [id number] Date of birth: Oct 29, 1921 Age: 77 y.o. Gender: male PCP: Kristian Covey, MD  Date: 08/17/2011 Reason for Consult: SOB, wheezing Referring Physician: [facility] ED  Brief history This is a 77yo M with history of ILD who presented with cough, hypoxia, and wheezing. He required NIPPV in ER and rapidly improved.  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 His physiology is characterized by sever restriction. He had recent CVA and was living at rehab facility. He had roommate that has URI symptoms and a couple of days ago he started to have a productive cough and SOB. He was noted to have more wheezing today so he was brought to ER for further evaluation. In the ER he was mildly hypoxic and placed on NIPPV with rapid improvement. His CXR showed chronic interstitial markings but no new infiltrates or other remarkable findings.  ROS: +treated with cipro for UTI recently, no fevers or chills, no chest pain, no presyncopal or syncopal events  Past Medical History  Diagnosis Date  . Encounter for long-term (current) use of anticoagulants 12/09/2010  . Hypoxemia 05/15/2009  . Atrial fibrillation 01/19/2009  . CORONARY ARTERY DISEASE 10/07/2007  . AORTIC STENOSIS, SEVERE 10/07/2007  . PVD 10/07/2007  . HYPERLIPIDEMIA 10/07/2007  . HYPERTENSION 10/07/2007  . CEREBROVASCULAR DISEASE 10/07/2007  . DYSPNEA 02/14/2008  . OBSTRUCTIVE SLEEP APNEA 10/07/2007  . Asbestosis 02/21/2008  . History of colon cancer   . DDD (degenerative disc disease)     low back  . Colon polyp   . Pneumonia    Past Surgical History  Procedure Date  . Abdominal  aortic aneurysm repair 1992  . Coronary artery bypass graft 2005  . Aortic valve replacement 2005  . Knee surgery     left  . Eye surgery    Family History  Problem Relation Age of Onset  . COPD    . Colon cancer    . Lung cancer    . Heart disease    . Coronary artery disease    . Stroke Mother 58  . Heart disease Father    Social History:  reports that he quit smoking about 37 years ago. His smoking use included Cigarettes. He has a 20 pack-year smoking history. He has never used smokeless tobacco. He reports that he does not drink alcohol or use illicit drugs.  Allergies:  Allergies  Allergen Reactions  . Morphine Nausea And Vomiting  . Ultram (Tramadol Hcl) Other (See Comments)    Made "wild"    Medications:  Prior to Admission medications   Medication Sig Start Date End Date Taking? Authorizing Provider  amLODipine (NORVASC) 5 MG tablet Take 5 mg by mouth daily.   01/14/11 01/14/12 Yes Bruce W Burchette  arformoterol (BROVANA) 15 MCG/2ML NEBU Take 15 mcg by nebulization 2 (two) times daily.    Yes Historical Provider, MD  aspirin 81 MG EC tablet Take 81 mg by mouth daily.   07/07/11 07/06/12 Yes Annie Main, NP  budesonide (PULMICORT) 0.25 MG/2ML nebulizer solution Take 0.25 mg by nebulization 2 (two) times daily.     Yes Historical Provider, MD  ciprofloxacin (CIPRO) 250 MG tablet Take 250 mg by  mouth 2 (two) times daily. For 10 days.  Started about a week ago.    Yes Historical Provider, MD  clopidogrel (PLAVIX) 75 MG tablet Take 75 mg by mouth daily with breakfast.   07/07/11 07/06/12 Yes Annie Main, NP  donepezil (ARICEPT) 5 MG tablet Take 5 mg by mouth daily.     Yes Historical Provider, MD  furosemide (LASIX) 40 MG tablet Take 40 mg by mouth every other day.  12/13/10  Yes Valera Castle, MD  lansoprazole (PREVACID) 30 MG capsule Take 30 mg by mouth daily.     Yes Historical Provider, MD  lisinopril (PRINIVIL,ZESTRIL) 40 MG tablet Take 40 mg by mouth daily.     Yes Historical  Provider, MD  metoprolol tartrate (LOPRESSOR) 25 MG tablet Take 25 mg by mouth daily.     Yes Historical Provider, MD  Multiple Vitamin (MULTIVITAMIN) tablet Take 1 tablet by mouth daily.    Yes Historical Provider, MD  potassium chloride (KLOR-CON) 10 MEQ CR tablet Take 20 mEq by mouth daily.     Yes Historical Provider, MD  pravastatin (PRAVACHOL) 40 MG tablet Take 40 mg by mouth at bedtime.    Yes Historical Provider, MD  Probiotic Product (PROBIOTIC PO) Take 1 tablet by mouth daily.     Yes Historical Provider, MD   Temp:  [97.9 F (36.6 C)] 97.9 F (36.6 C) (12/16 1857) Pulse Rate:  [47-65] 65  (12/16 1857) Resp:  [16-23] 18  (12/16 1857) BP: (96-119)/(48-54) 111/48 mmHg (12/16 1857) SpO2:  [88 %-100 %] 100 % (12/16 1857) FiO2 (%):  [50 %] 50 % (12/16 1709)  gen NAD ENT moist mucous membranes Neck no masses or JVD Lymph no cervical or supraclavicular lymphadenopathy CV irreg irreg, no murmur pulm mild wheezing abd soft, non-tender, and non-distended Ext no LE edema or clubbing Skin no rashes   Lab Results  Component Value Date   CREATININE 0.87 08/17/2011   BUN 29* 08/17/2011   NA 140 08/17/2011   K 4.2 08/17/2011   CL 107 08/17/2011   CO2 25 08/17/2011   Lab Results  Component Value Date   WBC 8.4 08/17/2011   HGB 11.4* 08/17/2011   HCT 34.6* 08/17/2011   MCV 93.3 08/17/2011   PLT 133* 08/17/2011   Lab Results  Component Value Date   ALT 28 08/17/2011   AST 22 08/17/2011   ALKPHOS 90 08/17/2011   BILITOT 0.2* 08/17/2011   Lab Results  Component Value Date   INR 1.03 06/29/2011   INR 2.6 05/22/2011   INR 2.3 04/24/2011   PROTIME 16.5 02/16/2009   Assessment and Plan  This is an 77yo M with history of asbestosis who presented with cough and hypoxia. CXR is unremarkable. This is likley secondary to viral URI.  1. Cough, hypoxia -- swab for flu -- FQ x 5d -- bronchodilators -- steroids x short course -- NIPPV overnight -- blood cx were sent in ER  2.  UTI -- secondary coverage with FQ as above  3. HTN -- cont home meds  4. Social -- family is having hard time taking care of him at home, this should be addressed prior to discharge  FULL CODE  The patient is critically ill requiring positive pressure ventilation and required complexity decision making for assessment and support, frequent evaluation and titration of therapies, application of advanced monitoring technologies and extensive interpretation of multiple databases.  Total critical care time excluding procedures:  Joie Bimler MD, MPH  Tyshika Baldridge 08/17/2011, 8:06 PM

## 2011-08-17 NOTE — ED Notes (Signed)
Pt toileted and turned.

## 2011-08-18 DIAGNOSIS — J810 Acute pulmonary edema: Secondary | ICD-10-CM

## 2011-08-18 DIAGNOSIS — E119 Type 2 diabetes mellitus without complications: Secondary | ICD-10-CM

## 2011-08-18 DIAGNOSIS — J9600 Acute respiratory failure, unspecified whether with hypoxia or hypercapnia: Secondary | ICD-10-CM

## 2011-08-18 LAB — BLOOD GAS, ARTERIAL
Drawn by: 331761
FIO2: 1 %
pCO2 arterial: 37.9 mmHg (ref 35.0–45.0)
pO2, Arterial: 280 mmHg — ABNORMAL HIGH (ref 80.0–100.0)

## 2011-08-18 LAB — BASIC METABOLIC PANEL
CO2: 23 mEq/L (ref 19–32)
Chloride: 107 mEq/L (ref 96–112)
Creatinine, Ser: 0.77 mg/dL (ref 0.50–1.35)

## 2011-08-18 LAB — CBC
Hemoglobin: 11.9 g/dL — ABNORMAL LOW (ref 13.0–17.0)
MCV: 92.5 fL (ref 78.0–100.0)
Platelets: 128 10*3/uL — ABNORMAL LOW (ref 150–400)
RBC: 3.86 MIL/uL — ABNORMAL LOW (ref 4.22–5.81)
WBC: 10 10*3/uL (ref 4.0–10.5)

## 2011-08-18 LAB — PHOSPHORUS: Phosphorus: 3 mg/dL (ref 2.3–4.6)

## 2011-08-18 LAB — MAGNESIUM: Magnesium: 1.7 mg/dL (ref 1.5–2.5)

## 2011-08-18 MED ORDER — METHYLPREDNISOLONE SODIUM SUCC 40 MG IJ SOLR
40.0000 mg | Freq: Two times a day (BID) | INTRAMUSCULAR | Status: DC
  Administered 2011-08-19 – 2011-08-20 (×3): 40 mg via INTRAVENOUS
  Filled 2011-08-18 (×5): qty 1

## 2011-08-18 MED ORDER — MAGNESIUM SULFATE BOLUS VIA INFUSION
2.0000 g | Freq: Once | INTRAVENOUS | Status: DC
  Filled 2011-08-18 (×3): qty 500

## 2011-08-18 MED ORDER — MIDAZOLAM BOLUS VIA INFUSION
1.0000 mg | INTRAVENOUS | Status: DC | PRN
  Filled 2011-08-18: qty 2

## 2011-08-18 MED ORDER — FENTANYL BOLUS VIA INFUSION
50.0000 ug | Freq: Four times a day (QID) | INTRAVENOUS | Status: DC | PRN
  Filled 2011-08-18: qty 100

## 2011-08-18 MED ORDER — SODIUM CHLORIDE 0.9 % IV SOLN
50.0000 ug/h | INTRAVENOUS | Status: DC
  Filled 2011-08-18: qty 50

## 2011-08-18 MED ORDER — SODIUM CHLORIDE 0.9 % IV SOLN
2.0000 mg/h | INTRAVENOUS | Status: DC
  Filled 2011-08-18: qty 10

## 2011-08-18 MED ORDER — SODIUM CHLORIDE 0.9 % IV BOLUS (SEPSIS): 500.0000 mL | Freq: Once | INTRAVENOUS | Status: DC

## 2011-08-18 MED ORDER — IPRATROPIUM BROMIDE 0.02 % IN SOLN
0.5000 mg | Freq: Four times a day (QID) | RESPIRATORY_TRACT | Status: DC
  Administered 2011-08-18 – 2011-08-19 (×5): 0.5 mg via RESPIRATORY_TRACT
  Filled 2011-08-18 (×5): qty 2.5

## 2011-08-18 MED ORDER — METHYLPREDNISOLONE SODIUM SUCC 125 MG IJ SOLR: 60.0000 mg | Freq: Four times a day (QID) | INTRAMUSCULAR | Status: DC

## 2011-08-18 MED ORDER — ALBUTEROL SULFATE (5 MG/ML) 0.5% IN NEBU
2.5000 mg | INHALATION_SOLUTION | Freq: Four times a day (QID) | RESPIRATORY_TRACT | Status: DC
  Administered 2011-08-18 – 2011-08-19 (×5): 2.5 mg via RESPIRATORY_TRACT
  Filled 2011-08-18 (×5): qty 0.5

## 2011-08-18 MED ORDER — MAGNESIUM SULFATE 40 MG/ML IJ SOLN
2.0000 g | Freq: Once | INTRAMUSCULAR | Status: AC
  Administered 2011-08-18: 2 g via INTRAVENOUS
  Filled 2011-08-18: qty 50

## 2011-08-18 NOTE — Procedures (Deleted)
Arterial Catheter Insertion Procedure Note [name] [id number] December 27, 1921  Procedure: Insertion of Arterial Catheter  Indications: Blood pressure monitoring  Procedure Details Consent: Risks of procedure as well as the alternatives and risks of each were explained to the (patient/caregiver).  Consent for procedure obtained. and Unable to obtain consent because of altered level of consciousness. Time Out: Verified patient identification, verified procedure, site/side was marked, verified correct patient position, special equipment/implants available, medications/allergies/relevent history reviewed, required imaging and test results available.  Performed  Maximum sterile technique was used including antiseptics, cap, gloves, gown, hand hygiene, mask and sheet. Skin prep: Chlorhexidine; local anesthetic administered 20 gauge catheter was inserted into right radial artery using the Seldinger technique.  Evaluation Blood flow good; BP tracing good. Complications: No apparent complications.   [name] 08/18/2011

## 2011-08-18 NOTE — Procedures (Deleted)
Intubation Procedure Note MEIR ELWOOD [id number] May 14, 1922  Procedure: Intubation Indications: Respiratory insufficiency  Procedure Details Consent: Risks of procedure as well as the alternatives and risks of each were explained to the (patient/caregiver).  Consent for procedure obtained. Time Out: Verified patient identification, verified procedure, site/side was marked, verified correct patient position, special equipment/implants available, medications/allergies/relevent history reviewed, required imaging and test results available.  Performed  Maximum sterile technique was used including cap, gown and mask.  Miller    Evaluation Hemodynamic Status: BP stable throughout; O2 sats: transiently fell during during procedure Patient's Current Condition: stable Complications: No apparent complications Patient did tolerate procedure well. Chest X-ray ordered to verify placement.  CXR: pending.   Koren Bound 08/18/2011

## 2011-08-18 NOTE — Progress Notes (Signed)
Patient's discharge plan is home with family.  They prefer to take him home with [facility] and HH PT.  They are also concerned about his blood sugar.  He is not diabetic but pt's daughter, Lawrence Santiago, stated that it is always high.

## 2011-08-18 NOTE — Procedures (Deleted)
Central Venous Catheter Insertion Procedure Note [name] [id number] 21-May-1922  Procedure: Insertion of Central Venous Catheter Indications: Drug and/or fluid administration  Procedure Details Consent: Risks of procedure as well as the alternatives and risks of each were explained to the (patient/caregiver).  Consent for procedure obtained. Time Out: Verified patient identification, verified procedure, site/side was marked, verified correct patient position, special equipment/implants available, medications/allergies/relevent history reviewed, required imaging and test results available.  Performed  Maximum sterile technique was used including antiseptics, cap, gloves, gown, hand hygiene, mask and sheet. Skin prep: Chlorhexidine; local anesthetic administered A antimicrobial bonded/coated triple lumen catheter was placed in the left internal jugular vein using the Seldinger technique.  Evaluation Blood flow good Complications: No apparent complications Patient did tolerate procedure well. Chest X-ray ordered to verify placement.  CXR: pending.  [name] 08/18/2011, 5:41 PM

## 2011-08-18 NOTE — Progress Notes (Signed)
Placed pt. On CPAP auto titrate via FFM (Per MD order). CPAP is set on auto titrate with a min. of 5 & max. Of 15. Pt. Is tolerating CPAP well at this time.

## 2011-08-18 NOTE — Progress Notes (Deleted)
Spoke with the family, informed them that the patient is responding poorly in the sense of BP and sats. Told them that we will continue to do our best to maintain her but be aware that these are bad signs in patients with pulmonary HTN.   Total CC time of the day of 90 mmHg.  

## 2011-08-18 NOTE — H&P (Signed)
HPI:  This is a 77yo M with history of ILD who presented with cough, hypoxia, and wheezing. He required NIPPV in ER and rapidly improved.  Antibiotics:   Levaquin 12/16>>>  Cultures/Sepsis Markers:   Blood Cx 12/16>>> Influenza 12/16>>>  Access/Protocols:  PIV  Best Practice: DVT: LMWH GI: PPI while on steroids.  Subjective: Feels better today.  Physical Exam: Filed Vitals:   08/18/11 1009  BP: 107/45  Pulse: 104  Temp:   Resp:     Intake/Output Summary (Last 24 hours) at 08/18/11 1139 Last data filed at 08/18/11 1100  Gross per 24 hour  Intake    188 ml  Output    100 ml  Net     88 ml   Vent Mode:  [-]  FiO2 (%):  [50 %] 50 %  Neuro: Alert but pleasantly confused. Cardiac: IRIR, Nl S1/S2, -M/R/G. Pulmonary: Clear anteriorily. GI: Soft, NT, ND and +BS. Extremities: -edema and -tenderness.  Labs: CBC    Component Value Date/Time   WBC 10.0 08/18/2011 0600   RBC 3.86* 08/18/2011 0600   HGB 11.9* 08/18/2011 0600   HCT 35.7* 08/18/2011 0600   PLT 128* 08/18/2011 0600   MCV 92.5 08/18/2011 0600   MCH 30.8 08/18/2011 0600   MCHC 33.3 08/18/2011 0600   RDW 14.7 08/18/2011 0600   LYMPHSABS 3.2 08/17/2011 1721   MONOABS 0.6 08/17/2011 1721   EOSABS 0.3 08/17/2011 1721   BASOSABS 0.0 08/17/2011 1721    BMET    Component Value Date/Time   NA 139 08/18/2011 0600   K 4.2 08/18/2011 0600   CL 107 08/18/2011 0600   CO2 23 08/18/2011 0600   GLUCOSE 169* 08/18/2011 0600   BUN 23 08/18/2011 0600   CREATININE 0.77 08/18/2011 0600   CALCIUM 9.2 08/18/2011 0600   GFRNONAA 79* 08/18/2011 0600   GFRAA >90 08/18/2011 0600   ABG    Component Value Date/Time   PHART 7.404 08/18/2011 0315   PCO2ART 37.9 08/18/2011 0315   PO2ART 280.0* 08/18/2011 0315   HCO3 23.3 08/18/2011 0315   TCO2 24.4 08/18/2011 0315   ACIDBASEDEF 0.8 08/18/2011 0315   O2SAT 99.7 08/18/2011 0315    Lab 08/18/11 0600  MG 1.7   Lab Results  Component Value Date   CALCIUM 9.2  08/18/2011   PHOS 3.0 08/18/2011    Chest Xray:   Assessment & Plan: This is an 77yo M with history of asbestosis who presented with cough and hypoxia. CXR is unremarkable. This is likley secondary to viral URI.  1. Cough, hypoxia  - Swab for flu  - Levaquin x 5d  - Bronchodilators  - Steroids x short course x5 days.  - Change NIPPV to PRN. - Blood cx were sent in ER not yet final. - Low dose lasix today. - Titrate O2 for sat of 88-92%. - IS and flutter valve.  2. UTI  - Secondary coverage with Levquin as above.   3. HTN  - Cont home meds.  4. Social  - Family wish to take patient home not to rehab.  5. CBG: needs AC/HS coverage.  Transfer patient to [facility] hospitalist team 8 and PCCM signing off, please call back if needed.  FULL Jeni Salles, MD [phone]

## 2011-08-18 NOTE — Plan of Care (Signed)
Problem: Phase I Progression Outcomes Goal: Hemodynamically stable Outcome: Progressing Patient is still on Cardizem drip.

## 2011-08-18 NOTE — Plan of Care (Signed)
Unable to complete admission history at present time.  Pt. Alert and oriented to self. Pt. Is notably confused and unable to answer simple questions appropriately

## 2011-08-18 NOTE — Progress Notes (Deleted)
Patient intubated successfully and TLC placed. The saturation has been very poor prior and after. However, when I attempted to increase PEEP SBP dropped dramatically. Will need additional volume and D/C of lasix inorder to be able to recruit and increase oxygenation.  At this point, it is also difficult to ascertain if patient has a PNA, will start vanc/zosyn/aztreonam and pan culture.  Prognosis is very poor, the patient is not responding to mechanical ventilation and decompensating very rapidly.      

## 2011-08-19 ENCOUNTER — Inpatient Hospital Stay (HOSPITAL_COMMUNITY): Payer: Medicare Other

## 2011-08-19 DIAGNOSIS — R410 Disorientation, unspecified: Secondary | ICD-10-CM | POA: Diagnosis not present

## 2011-08-19 DIAGNOSIS — J208 Acute bronchitis due to other specified organisms: Secondary | ICD-10-CM | POA: Diagnosis present

## 2011-08-19 DIAGNOSIS — D72829 Elevated white blood cell count, unspecified: Secondary | ICD-10-CM | POA: Diagnosis not present

## 2011-08-19 DIAGNOSIS — I959 Hypotension, unspecified: Secondary | ICD-10-CM | POA: Diagnosis not present

## 2011-08-19 DIAGNOSIS — F0390 Unspecified dementia without behavioral disturbance: Secondary | ICD-10-CM | POA: Diagnosis present

## 2011-08-19 DIAGNOSIS — E861 Hypovolemia: Secondary | ICD-10-CM | POA: Diagnosis not present

## 2011-08-19 DIAGNOSIS — D696 Thrombocytopenia, unspecified: Secondary | ICD-10-CM | POA: Diagnosis present

## 2011-08-19 DIAGNOSIS — J9601 Acute respiratory failure with hypoxia: Secondary | ICD-10-CM | POA: Diagnosis present

## 2011-08-19 LAB — CBC
HCT: 34 % — ABNORMAL LOW (ref 39.0–52.0)
Hemoglobin: 11.4 g/dL — ABNORMAL LOW (ref 13.0–17.0)
MCH: 30.5 pg (ref 26.0–34.0)
MCHC: 33.5 g/dL (ref 30.0–36.0)

## 2011-08-19 LAB — GLUCOSE, CAPILLARY
Glucose-Capillary: 219 mg/dL — ABNORMAL HIGH (ref 70–99)
Glucose-Capillary: 232 mg/dL — ABNORMAL HIGH (ref 70–99)

## 2011-08-19 LAB — BASIC METABOLIC PANEL
BUN: 35 mg/dL — ABNORMAL HIGH (ref 6–23)
Calcium: 9.4 mg/dL (ref 8.4–10.5)
GFR calc non Af Amer: 59 mL/min — ABNORMAL LOW (ref >90)
Glucose, Bld: 157 mg/dL — ABNORMAL HIGH (ref 70–99)

## 2011-08-19 MED ORDER — SODIUM CHLORIDE 0.9 % IV BOLUS (SEPSIS)
250.0000 mL | Freq: Once | INTRAVENOUS | Status: AC
  Administered 2011-08-19: 250 mL via INTRAVENOUS

## 2011-08-19 MED ORDER — ENSURE CLINICAL ST REVIGOR PO LIQD
237.0000 mL | Freq: Two times a day (BID) | ORAL | Status: DC
  Administered 2011-08-20 – 2011-08-23 (×7): 237 mL via ORAL

## 2011-08-19 MED ORDER — LISINOPRIL 40 MG PO TABS
40.0000 mg | ORAL_TABLET | Freq: Every day | ORAL | Status: DC
  Administered 2011-08-19 – 2011-08-22 (×4): 40 mg via ORAL
  Filled 2011-08-19 (×4): qty 1

## 2011-08-19 MED ORDER — SODIUM CHLORIDE 0.9 % IV SOLN
250.0000 mL | INTRAVENOUS | Status: DC
  Administered 2011-08-19: 250 mL via INTRAVENOUS

## 2011-08-19 MED ORDER — METOPROLOL TARTRATE 25 MG PO TABS
25.0000 mg | ORAL_TABLET | Freq: Two times a day (BID) | ORAL | Status: DC
  Administered 2011-08-19 – 2011-08-22 (×6): 25 mg via ORAL
  Filled 2011-08-19 (×8): qty 1

## 2011-08-19 MED ORDER — AMLODIPINE BESYLATE 5 MG PO TABS
5.0000 mg | ORAL_TABLET | Freq: Every day | ORAL | Status: DC
  Administered 2011-08-19 – 2011-08-22 (×4): 5 mg via ORAL
  Filled 2011-08-19 (×4): qty 1

## 2011-08-19 NOTE — Progress Notes (Signed)
Placed pt. On CPAP auto titrate (Min: 5, Max: 15) via FFM (per MD order). Pt. Is tolerating CPAP well at this time.

## 2011-08-19 NOTE — Progress Notes (Signed)
INITIAL ADULT NUTRITION ASSESSMENT Date: 08/19/2011   Time: 3:37 PM  Reason for Assessment: Nutrition Risk Report (unintentional weight loss, dysphagia)  ASSESSMENT: Male 77 y.o.  Dx: Acute respiratory failure with hypoxia  Hx:  Past Medical History  Diagnosis Date  . Encounter for long-term (current) use of anticoagulants 12/09/2010  . Hypoxemia 05/15/2009  . Atrial fibrillation 01/19/2009  . CORONARY ARTERY DISEASE 10/07/2007  . AORTIC STENOSIS, SEVERE 10/07/2007  . PVD 10/07/2007  . HYPERLIPIDEMIA 10/07/2007  . HYPERTENSION 10/07/2007  . CEREBROVASCULAR DISEASE 10/07/2007  . DYSPNEA 02/14/2008  . OBSTRUCTIVE SLEEP APNEA 10/07/2007  . Asbestosis 02/21/2008  . History of colon cancer   . DDD (degenerative disc disease)     low back  . Colon polyp   . Pneumonia     Scheduled Meds:    . ipratropium  0.5 mg Nebulization Q6H   And  . albuterol  2.5 mg Nebulization Q6H  . amLODipine  5 mg Oral Daily  . antiseptic oral rinse  15 mL Mouth Rinse BID  . aspirin  81 mg Oral Daily  . clopidogrel  75 mg Oral Q breakfast  . donepezil  5 mg Oral QHS  . enoxaparin  40 mg Subcutaneous Daily  . levofloxacin  500 mg Oral Daily  . lisinopril  40 mg Oral Daily  . magnesium sulfate 1 - 4 g bolus IVPB  2 g Intravenous Once  . methylPREDNISolone (SOLU-MEDROL) injection  40 mg Intravenous Q12H  . metoprolol tartrate  25 mg Oral BID  . pantoprazole (PROTONIX) IV  40 mg Intravenous Daily  . simvastatin  20 mg Oral q1800  . sodium chloride  250 mL Intravenous Once  . DISCONTD: albuterol  2.5 mg Nebulization Q6H  . DISCONTD: amLODipine  5 mg Oral Daily  . DISCONTD: furosemide  40 mg Oral Q48H  . DISCONTD: ipratropium  0.5 mg Nebulization Q4H  . DISCONTD: lisinopril  40 mg Oral Daily  . DISCONTD: magnesium  2 g Intravenous Once  . DISCONTD: methylPREDNISolone (SOLU-MEDROL) injection  60 mg Intravenous Q6H  . DISCONTD: methylPREDNISolone (SOLU-MEDROL) injection  40 mg Intravenous Q12H  . DISCONTD:  metoprolol tartrate  25 mg Oral BID  . DISCONTD: sodium chloride  500 mL Intravenous Once   Continuous Infusions:    . sodium chloride    . DISCONTD: albuterol Stopped (08/17/11 2354)  . DISCONTD: fentaNYL infusion INTRAVENOUS    . DISCONTD: midazolam (VERSED) infusion     PRN Meds:.DISCONTD: sodium chloride, DISCONTD: fentaNYL, DISCONTD: midazolam   Ht: 5\' 9"  (175.3 cm)  Wt: 165 lb 12.6 oz (75.2 kg)  Ideal Wt: 160 lb (72.6 kg) % Ideal Wt: 104%  Usual Wt: 83 kg (06/29/11) % Usual Wt: 91%  Body mass index is 24.48 kg/(m^2).  Normal weight  Food/Nutrition Related Hx: 9% weight loss x <2 months  Labs:  CMP     Component Value Date/Time   NA 136 08/19/2011 0348   K 4.5 08/19/2011 0348   CL 102 08/19/2011 0348   CO2 24 08/19/2011 0348   GLUCOSE 157* 08/19/2011 0348   BUN 35* 08/19/2011 0348   CREATININE 1.09 08/19/2011 0348   CALCIUM 9.4 08/19/2011 0348   PROT 6.5 08/17/2011 1721   ALBUMIN 2.9* 08/17/2011 1721   AST 22 08/17/2011 1721   ALT 28 08/17/2011 1721   ALKPHOS 90 08/17/2011 1721   BILITOT 0.2* 08/17/2011 1721   GFRNONAA 59* 08/19/2011 0348   GFRAA 68* 08/19/2011 0348    CBG (last 3)  Basename 08/19/11 1135 08/18/11 2145  GLUCAP 219* 257*    Lab Results  Component Value Date   HGBA1C 6.1* 06/29/2011   HGBA1C  Value: 6.1 (NOTE)   The ADA recommends the following therapeutic goals for glycemic   control related to Hgb A1C measurement:   Goal of Therapy:   < 7.0% Hgb A1C   Action Suggested:  > 8.0% Hgb A1C   Ref:  Diabetes Care, 22, Suppl. 1, 1999 11/24/2007   Lab Results  Component Value Date   LDLCALC 68 06/30/2011   CREATININE 1.09 08/19/2011     Intake/Output Summary (Last 24 hours) at 08/19/11 1537 Last data filed at 08/19/11 1610  Gross per 24 hour  Intake    570 ml  Output   1000 ml  Net   -430 ml    Diet Order: Regular diet, 75-100% of meals  Supplements/Tube Feeding:  none  Estimated Nutritional Needs:   Kcal:1674-1813  kcal/day Protein:75-95 grams/day Fluid:1.9 L/day  NUTRITION DIAGNOSIS: -Unintended weight loss  RELATED TO: inadequate oral intake vs. Increased energy needs  AS EVIDENCED BY: 9% weight loss x 2 months PTA  MONITORING/EVALUATION(Goals): Patient to meet minimum estimated needs with po intake  EDUCATION NEEDS: -No education needs identified at this time  INTERVENTION: Please offer Ensure Clinical Strength bid  Dietitian #:[phone]  DOCUMENTATION CODES Per approved criteria  -Not Applicable    Otto Herb 08/19/2011, 3:37 PM

## 2011-08-19 NOTE — Progress Notes (Signed)
Subjective: We have assumed care from pulmonary critical care medicine today. During the night patient respiratory status declined to require placement on CPAP and then later on BiPAP. According to the RN caring for the patient he is less confused than yesterday. He is arousable but somewhat lethargic. He seems to have some difficulty in answering her questions. His exam is otherwise nonfocal.  Objective: Vital signs in last 24 hours: Temp:  [97 F (36.1 C)-99.6 F (37.6 C)] 97 F (36.1 C) (12/18 0403) Pulse Rate:  [63-95] 69  (12/18 0445) Resp:  [18-26] 18  (12/18 0445) BP: (94-126)/(48-73) 126/73 mmHg (12/18 0925) SpO2:  [94 %-99 %] 97 % (12/18 0900) Weight:  [75.2 kg (165 lb 12.6 oz)-76.1 kg (167 lb 12.3 oz)] 165 lb 12.6 oz (75.2 kg) (12/18 0500) Weight change: -2.3 kg (-5 lb 1.1 oz)    Intake/Output from previous day: 12/17 0701 - 12/18 0700 In: 338 [P.O.:218; I.V.:120] Out: 1100 [Urine:1100] Intake/Output this shift:    General appearance: mild distress, slowed mentation and somewhat toxic appearing Resp: clear to auscultation bilaterally anteriorly, posteriorly seems somewhat diminished with basilar crackles, he is on BiPAP with oxygen bleed in Cardio: irregularly irregular rhythm and S1, S2 normal, atrial fibrillation rate controlled GI: soft, non-tender; bowel sounds normal; no masses,  no organomegaly Extremities: extremities normal, atraumatic, no cyanosis or edema Neurologic: Lethargic but arousable, spontaneously moving all extremities x4, otherwise exam nonfocal  Lab Results:  Basename 08/19/11 0348 08/18/11 0600  WBC 13.8* 10.0  HGB 11.4* 11.9*  HCT 34.0* 35.7*  PLT 180 128*   BMET  Basename 08/19/11 0348 08/18/11 0600  NA 136 139  K 4.5 4.2  CL 102 107  CO2 24 23  GLUCOSE 157* 169*  BUN 35* 23  CREATININE 1.09 0.77  CALCIUM 9.4 9.2    Studies/Results: Dg Chest Port 1 View  08/19/2011  *RADIOLOGY REPORT*  Clinical Data: Shortness of breath.   PORTABLE CHEST - 1 VIEW  Comparison: 08/17/2011  Findings: There is no visible endotracheal tube.  The patient has calcified pleural plaques bilaterally.  Heart size and vascularity are normal.  Tortuosity of the thoracic aorta.  No acute infiltrates or effusions.  IMPRESSION: Chronic changes in the lungs with pleural plaques.  No acute abnormalities.  Original Report Authenticated By: Gwynn Burly, M.D.   Dg Chest Portable 1 View  08/17/2011  *RADIOLOGY REPORT*  Clinical Data: Cough, congestion, shortness of breath  PORTABLE CHEST - 1 VIEW  Comparison: 07/03/2011  Findings: Chronic interstitial markings with calcified pleural plaques.  No focal consolidation or frank interstitial edema. No pleural effusion or pneumothorax.  Cardiomegaly.  IMPRESSION: No evidence of acute cardiopulmonary disease.  Cardiomegaly with chronic interstitial markings and calcified pleural plaques.  Original Report Authenticated By: Charline Bills, M.D.    Medications:  I have reviewed the patient's current medications. Scheduled:   . ipratropium  0.5 mg Nebulization Q6H   And  . albuterol  2.5 mg Nebulization Q6H  . amLODipine  5 mg Oral Daily  . antiseptic oral rinse  15 mL Mouth Rinse BID  . aspirin  81 mg Oral Daily  . clopidogrel  75 mg Oral Q breakfast  . donepezil  5 mg Oral QHS  . enoxaparin  40 mg Subcutaneous Daily  . levofloxacin  500 mg Oral Daily  . lisinopril  40 mg Oral Daily  . magnesium sulfate 1 - 4 g bolus IVPB  2 g Intravenous Once  . methylPREDNISolone (SOLU-MEDROL) injection  40  mg Intravenous Q12H  . metoprolol tartrate  25 mg Oral BID  . pantoprazole (PROTONIX) IV  40 mg Intravenous Daily  . simvastatin  20 mg Oral q1800  . sodium chloride  250 mL Intravenous Once  . DISCONTD: albuterol  2.5 mg Nebulization Q6H  . DISCONTD: amLODipine  5 mg Oral Daily  . DISCONTD: furosemide  40 mg Oral Q48H  . DISCONTD: ipratropium  0.5 mg Nebulization Q4H  . DISCONTD: lisinopril  40 mg Oral  Daily  . DISCONTD: magnesium  2 g Intravenous Once  . DISCONTD: methylPREDNISolone (SOLU-MEDROL) injection  60 mg Intravenous Q6H  . DISCONTD: methylPREDNISolone (SOLU-MEDROL) injection  40 mg Intravenous Q12H  . DISCONTD: metoprolol tartrate  25 mg Oral BID  . DISCONTD: sodium chloride  500 mL Intravenous Once    Assessment/Plan:  Principal Problem:  *Acute respiratory failure with hypoxia/ Viral bronchitis Did require CPAP/BiPAP during the night. Suspect this was likely due to chronic issues related to sleep apnea. According to the flow record previously he was on no oxygen and maintained saturations of 96%. Have asked respiratory therapy to titrate and wean off BiPAP in favor of Ventimask or nasal cannula oxygen as indicated by saturations. We'll continue empiric Levaquin to cover for infectious process today is day #3.  Active Problems:  Chronic respiratory failure secondary to COPD and Asbestosis OBSTRUCTIVE SLEEP APNEA Utilizing nocturnal BiPAP, unclear if is on nasal cannula oxygen at home. Continue low-dose IV Solu-Medrol. Is not on chronic prednisone at home. Continue supportive care with nebulizer treatments as well.   Chronic Atrial fibrillation not a Coumadin candidate secondary to fall risk Currently is rate controlled after restoration of fluid volume. We will continue to follow.    Leukocytosis/Thrombocytopenia Presented with low platelets likely from acute viral illness. This has improved with initiation of empiric steroid therapy for respiratory decompensation. Leukocytosis without fever has developed after admission and is likely due to steroid therapy.   Intravascular volume depletion/Hypotension Suspect had a degree of intravascular volume depletion at presentation since he presented with a viral illness. Because of acute respiratory decompensation it was suspected he had a degree of volume overload influencing and therefore was given IV Lasix yesterday. Since that time  he had developed recurrent atrial fibrillation with rapid ventricular response and relative hypotension and slightly altered mentation. We will go ahead and give her 250 cc normal saline bolus and begin IV fluids continuously at 75 cc per hour. We have reviewed echocardiogram results the patient has preserved LV function dating back to 2009.   HYPERTENSION He has a relative hypotension with blood pressures as low as 103 systolic yesterday evening. We will continue usual blood pressure medications but with hold parameters. He is on Norvasc. Lopressor and lisinopril.   CORONARY ARTERY DISEASE Currently appears asymptomatic without any endorsed complaints of chest pain nor EKG changes. We will continue low-dose aspirin, Plavix, Lopressor as prior to admission.   Memory loss/Dementia/Delirium, acute Preadmission medications include Aricept. He has memory loss listed as a chronic problem and likely has underlying dementia. Do to acute illness and change in physical location he'll patient has developed a mild degree of acute delirium. We will continue to monitor patient for progression of this problem. Would like to avoid benzodiazepine treatment if possible since this will likely worsen his acute delirium given his advanced age. As soon as he is stable enough from a respiratory standpoint would like to initiate mobilization therapy to include PT and OT evaluations.  Disposition Due to  tenuous respiratory status in the setting of an elderly patient with acute delirium we will continue observation in the step down unit.   LOS: 2 days   [name], ANP pager [phone]  [facility] hospitalists-team [email or web address] Password: [email or web address] 08/19/2011, 10:19 AM

## 2011-08-19 NOTE — Progress Notes (Signed)
Physical Therapy Evaluation Patient Details Name: [name] MRN: [id number] DOB: Sep 09, 1921 Today's Date: 08/19/2011  Problem List:  Patient Active Problem List  Diagnoses  . HYPERLIPIDEMIA  . OBSTRUCTIVE SLEEP APNEA  . HYPERTENSION  . CORONARY ARTERY DISEASE  . Atrial fibrillation  . CEREBROVASCULAR DISEASE  . PVD  . ALLERGIC RHINITIS, CHRONIC  . COPD  . Asbestosis  . EDEMA  . HYPOXEMIA  . CORONARY ARTERY BYPASS GRAFT, HX OF  . Memory loss  . Encounter for long-term (current) use of anticoagulants  . Lumbar stenosis  . Stroke, small vessel, RIGHT PONTINE  . Aspiration pneumonia  . UTI (urinary tract infection)  . Acute respiratory failure with hypoxia  . Delirium, acute  . Dementia  . Viral bronchitis  . Leukocytosis  . Thrombocytopenia  . Intravascular volume depletion  . Hypotension    Past Medical History:  Past Medical History  Diagnosis Date  . Encounter for long-term (current) use of anticoagulants 12/09/2010  . Hypoxemia 05/15/2009  . Atrial fibrillation 01/19/2009  . CORONARY ARTERY DISEASE 10/07/2007  . AORTIC STENOSIS, SEVERE 10/07/2007  . PVD 10/07/2007  . HYPERLIPIDEMIA 10/07/2007  . HYPERTENSION 10/07/2007  . CEREBROVASCULAR DISEASE 10/07/2007  . DYSPNEA 02/14/2008  . OBSTRUCTIVE SLEEP APNEA 10/07/2007  . Asbestosis 02/21/2008  . History of colon cancer   . DDD (degenerative disc disease)     low back  . Colon polyp   . Pneumonia    Past Surgical History:  Past Surgical History  Procedure Date  . Abdominal aortic aneurysm repair 1992  . Coronary artery bypass graft 2005  . Aortic valve replacement 2005  . Knee surgery     left  . Eye surgery     PT Assessment/Plan/Recommendation PT Assessment Clinical Impression Statement: Pt presents to PT after admission for respiratory failure.  Pt with CVA about 8 weeks ago from which he has made significant recovery per family.  Family struggled with assistance pt required at home.  Needs skilled PT to maximize  independence and safety to decreas burden of care and allow pt to go to least restricttive environment.  PT Recommendation/Assessment: Patient will need skilled PT in the acute care venue PT Problem List: Decreased strength;Decreased cognition;Decreased knowledge of use of DME;Decreased balance;Decreased mobility Barriers to Discharge: Other (comment) (amount of assist pt requiring.) PT Therapy Diagnosis : Difficulty walking;Generalized weakness PT Plan PT Frequency: Min 3X/week PT Treatment/Interventions: DME instruction;Gait training;Functional mobility training;Balance training;Cognitive remediation;Patient/family education PT Recommendation Recommendations for Other Services: Rehab consult Follow Up Recommendations: Inpatient Rehab (If CIR not an option will need ST-SNF or lift at home.) Equipment Recommended: Defer to next venue PT Goals  Acute Rehab PT Goals PT Goal Formulation: With patient/family Time For Goal Achievement: 2 weeks Pt will go Supine/Side to Sit: with mod assist;with cues (comment type and amount) (moderate verbal/tactile cues.) PT Goal: Supine/Side to Sit - Progress: Not met Pt will go Sit to Supine/Side: with mod assist;with cues (comment type and amount) (with moderate verbal/tactile cues) PT Goal: Sit to Supine/Side - Progress: Not met Pt will go Sit to Stand: with +2 total assist (pt = 70%) PT Goal: Sit to Stand - Progress: Not met Pt will go Stand to Sit: with +2 total assist (pt = 70%) PT Goal: Stand to Sit - Progress: Not met Pt will Stand: with bilateral upper extremity support;1 - 2 min;with mod assist;with cues (comment type and amount) (moderate verbal/tactile cues) PT Goal: Stand - Progress: Not met Pt will Ambulate: 1 -  15 feet;with +2 total assist;with least restrictive assistive device (may need sara plus) PT Goal: Ambulate - Progress: Not met  PT Evaluation Precautions/Restrictions  Precautions Precautions: Fall Prior Functioning  Home  Living Lives With: Spouse Receives Help From: Family Type of Home: House Home Layout: One level Home Access: Stairs to enter Entrance Stairs-Rails: None Entrance Stairs-Number of Steps: 2 Home Adaptive Equipment: Hospital bed;Bedside commode/3-in-1;Walker - rolling Prior Function Level of Independence: Needs assistance with tranfers (Nonambulatory since CVA in Oct. Max assist for transfers.) Driving: No Vocation: Retired Comments: Pt was home just one day from SNF before return to hospital.  Dtr. needs pt to be more mobile before return home. Cognition Cognition Arousal/Alertness: Awake/alert Overall Cognitive Status: Impaired Attention: Impaired Current Attention Level: Sustained Orientation Level: Oriented to person Following Commands: Follows one step commands consistently;Follows multi-step commands inconsistently Problem Solving: Requires assistance for problem solving Sensation/Coordination   Extremity Assessment RLE Strength RLE Overall Strength Comments: Unable to formally assess due to cognition but pt able to move against gravity. LLE Strength LLE Overall Strength Comments: Unable to formally assess due to cognition but pt able to move against gravity. Mobility (including Balance) Bed Mobility Bed Mobility: Yes Supine to Sit: Patient percentage (comment);1: +2 Total assist (pt = 40%) Supine to Sit Details (indicate cue type and reason): vebal/tactile cues and manual facilitation for technique. Transfers Transfers: Yes Sit to Stand: 1: +2 Total assist;From bed;Without upper extremity assist;Patient percentage (comment) (Pt = 40-50%.  using arms to pull up on walker/stedy) Sit to Stand Details (indicate cue type and reason): Verbal/tactile cues and manual facilitation to bring weight anteriorly for sit to stand. Stand to Sit: 1: +2 Total assist;Patient percentage (comment);To bed;To chair/3-in-1 (Pt = 40%) Stand to Sit Details: Manual facilitation to flex at hips to  sit. Transfer via Lift Equipment: Stedy  Posture/Postural Control Posture/Postural Control: Postural limitations Postural Limitations: pt initially with left lean in sitting EOB but after about 1 minute was able to maintain midline. Balance Balance Assessed: Yes Static Sitting Balance Static Sitting - Level of Assistance: 3: Mod assist;5: Stand by assistance (Improved after several minutes.) Static Sitting - Comment/# of Minutes: 20 minutes Exercise    End of Session PT - End of Session Equipment Utilized During Treatment: Gait belt;Other (comment) Antony Salmon.) Activity Tolerance: Patient tolerated treatment well Patient left: in chair;with call bell in reach;with family/visitor present Nurse Communication: Mobility status for transfers;Need for lift equipment (pt with maximove pad in place if needed for back to bed)  [facility] 08/19/2011, 4:49 PM [facility] PT [phone]

## 2011-08-19 NOTE — Progress Notes (Signed)
Inpatient Diabetes Program Recommendations  AACE/ADA: New Consensus Statement on Inpatient Glycemic Control (2009)  Target Ranges:  Prepandial:   less than 140 mg/dL      Peak postprandial:   less than 180 mg/dL (1-2 hours)      Critically ill patients:  140 - 180 mg/dL   Reason: HS JYN=[id number]   Inpatient Diabetes Program Recommendations Correction (SSI): start sensitive correction TID  Note:  Consider ordering CBGs TIDHS during steroid therapy. Thanks Piedad Climes RN, Diabetes Coordinator

## 2011-08-19 NOTE — Progress Notes (Signed)
I have examined the patient and reviewed the chart. I agree with the above note.

## 2011-08-19 NOTE — Progress Notes (Signed)
Placed pt. On CPAP auto titrate (min: 5, Max: 15) via [facility] (per MD order). Pt. Is tolerating CPAP well at this time.

## 2011-08-20 LAB — BASIC METABOLIC PANEL
CO2: 24 mEq/L (ref 19–32)
Chloride: 104 mEq/L (ref 96–112)
Creatinine, Ser: 0.93 mg/dL (ref 0.50–1.35)
Glucose, Bld: 173 mg/dL — ABNORMAL HIGH (ref 70–99)

## 2011-08-20 LAB — GLUCOSE, CAPILLARY
Glucose-Capillary: 217 mg/dL — ABNORMAL HIGH (ref 70–99)
Glucose-Capillary: 133 mg/dL — ABNORMAL HIGH (ref 70–99)

## 2011-08-20 LAB — HEMOGLOBIN A1C: Mean Plasma Glucose: 146 mg/dL — ABNORMAL HIGH (ref <117)

## 2011-08-20 LAB — MAGNESIUM: Magnesium: 1.8 mg/dL (ref 1.5–2.5)

## 2011-08-20 MED ORDER — INSULIN ASPART 100 UNIT/ML ~~LOC~~ SOLN
0.0000 [IU] | Freq: Three times a day (TID) | SUBCUTANEOUS | Status: DC
  Administered 2011-08-20: 2 [IU] via SUBCUTANEOUS
  Administered 2011-08-21 (×3): 1 [IU] via SUBCUTANEOUS
  Filled 2011-08-20: qty 3

## 2011-08-20 MED ORDER — IPRATROPIUM BROMIDE 0.02 % IN SOLN: 0.5000 mg | Freq: Four times a day (QID) | RESPIRATORY_TRACT | Status: DC | PRN

## 2011-08-20 MED ORDER — PREDNISONE 10 MG PO TABS: 10.0000 mg | ORAL_TABLET | Freq: Every day | ORAL | Status: DC

## 2011-08-20 MED ORDER — ALBUTEROL SULFATE (5 MG/ML) 0.5% IN NEBU: 2.5000 mg | INHALATION_SOLUTION | Freq: Four times a day (QID) | RESPIRATORY_TRACT | Status: DC | PRN

## 2011-08-20 MED ORDER — IPRATROPIUM BROMIDE 0.02 % IN SOLN: 0.5000 mg | Freq: Four times a day (QID) | RESPIRATORY_TRACT | Status: DC

## 2011-08-20 MED ORDER — PREDNISONE 5 MG PO TABS: 5.0000 mg | ORAL_TABLET | Freq: Every day | ORAL | Status: DC

## 2011-08-20 MED ORDER — PREDNISONE 20 MG PO TABS
20.0000 mg | ORAL_TABLET | Freq: Two times a day (BID) | ORAL | Status: DC
  Administered 2011-08-20 – 2011-08-21 (×3): 20 mg via ORAL
  Filled 2011-08-20 (×4): qty 1

## 2011-08-20 MED ORDER — SODIUM CHLORIDE 0.9 % IV SOLN
250.0000 mL | INTRAVENOUS | Status: DC
  Administered 2011-08-21: 1000 mL via INTRAVENOUS

## 2011-08-20 MED ORDER — PREDNISONE 10 MG PO TABS: 10.0000 mg | ORAL_TABLET | Freq: Two times a day (BID) | ORAL | Status: DC

## 2011-08-20 NOTE — Progress Notes (Signed)
Patient's daughter, Nicole Cella and pt's wife is here at the bedside and is updated on patient's medical status. They are aware that pt will be transferred to 5500.  Family is very supportive with the patient.  Report given to Ms. Alona Bene, RN of 5500.

## 2011-08-20 NOTE — Plan of Care (Signed)
Problem: Discharge Progression Outcomes Goal: Barriers To Progression Addressed/Resolved Outcome: Progressing Very weak.

## 2011-08-20 NOTE — Progress Notes (Signed)
Subjective: Much more alert today. Affect is slightly blunted which is consistent with underlying dementia. He seated upright in the bed and eating breakfast without any significant difficulty. He endorses feeling weak but states he feels much better than prior to admission. No family is available in the room  Objective: Vital signs in last 24 hours: Temp:  [97.6 F (36.4 C)-98.8 F (37.1 C)] 98.1 F (36.7 C) (12/19 0744) Pulse Rate:  [65-97] 85  (12/19 0943) Resp:  [13-23] 14  (12/19 0900) BP: (96-140)/(46-90) 140/71 mmHg (12/19 0943) SpO2:  [88 %-99 %] 96 % (12/19 0900) Weight:  [77.7 kg (171 lb 4.8 oz)] 171 lb 4.8 oz (77.7 kg) (12/19 0500) Weight change: 1.6 kg (3 lb 8.4 oz)    Intake/Output from previous day: 12/18 0701 - 12/19 0700 In: 1465 [P.O.:780; I.V.:685] Out: 750 [Urine:750] Intake/Output this shift: Total I/O In: 150 [I.V.:150] Out: -   General appearance: No further respiratory distress, distress, previous slowed mentation is resolved  Resp: clear to auscultation bilaterally anteriorly, posteriorly seems somewhat diminished with nearly basilar crackles, he is now tolerating room air with saturations between 88 and 97% Cardio: irregularly irregular rhythm and S1, S2 normal, atrial fibrillation rate controlled GI: soft, non-tender; bowel sounds normal; no masses,  no organomegaly Extremities: extremities normal, atraumatic, no cyanosis or edema Neurologic: Lethargic but arousable, spontaneously moving all extremities x4, otherwise exam nonfocal  Lab Results:  Basename 08/19/11 0348 08/18/11 0600  WBC 13.8* 10.0  HGB 11.4* 11.9*  HCT 34.0* 35.7*  PLT 180 128*   BMET  Basename 08/20/11 0405 08/19/11 0348  NA 135 136  K 4.5 4.5  CL 104 102  CO2 24 24  GLUCOSE 173* 157*  BUN 33* 35*  CREATININE 0.93 1.09  CALCIUM 9.2 9.4    Studies/Results: Dg Chest Port 1 View  08/19/2011  *RADIOLOGY REPORT*  Clinical Data: Shortness of breath.  PORTABLE CHEST - 1  VIEW  Comparison: 08/17/2011  Findings: There is no visible endotracheal tube.  The patient has calcified pleural plaques bilaterally.  Heart size and vascularity are normal.  Tortuosity of the thoracic aorta.  No acute infiltrates or effusions.  IMPRESSION: Chronic changes in the lungs with pleural plaques.  No acute abnormalities.  Original Report Authenticated By: Gwynn Burly, M.D.    Assessment/Plan:  Principal Problem:  *Acute respiratory failure with hypoxia/ Viral bronchitis Did require CPAP/BiPAP during the early a.m. hours of 08/19/2011 and  since then has remained on room air. Suspect need for CPAP was likely due to chronic issues related to sleep apnea. Continue empiric Levaquin to cover for infectious process today is day #4.  Active Problems:  Chronic respiratory failure secondary to COPD and Asbestosis OBSTRUCTIVE SLEEP APNEA Suspect utilizes nocturnal CPAP at home, also unclear if is on nasal cannula oxygen at home. Because the CPAP is being utilized only for sleep apnea and not for acute respiratory decompensation he can continue this on a non-stepdown level of care. Will discontinue low-dose IV Solu-Medrol in favor of prednisone taper. Is not on chronic prednisone at home. Continue supportive care with nebulizer treatments as well.   Chronic Atrial fibrillation not a Coumadin candidate secondary to fall risk Currently is rate controlled after restoration of fluid volume. We will continue to follow.  Hyperglycemia  Likely due to steroid therapy. No definitive underlying diabetes diagnosis. We'll check hemoglobin A1c and provide sliding scale insulin coverage.   Leukocytosis/Thrombocytopenia Presented with low platelets likely from acute viral illness. This has improved  with initiation of empiric steroid therapy for respiratory decompensation. Leukocytosis without fever has developed after admission and is likely due to steroid therapy.   Intravascular volume  depletion/Hypotension Suspect had a degree of intravascular volume depletion at presentation since he presented with a viral illness. Because of acute respiratory decompensation it was suspected he had a degree of volume overload influencing and therefore was given IV Lasix yesterday. Since that time he had developed recurrent atrial fibrillation with rapid ventricular response and relative hypotension and slightly altered mentation. He was subsequently given 250 cc normal saline bolus and IV fluids continuously at 75 cc per hour on 08/19/2011. Because his blood pressure is better today Will decrease IV fluids back to 40 cc per hour, he is also tolerating a diet. We have reviewed echocardiogram results the patient has preserved LV function dating back to 2009.   HYPERTENSION He has a relative hypotension with blood pressures as low as 103 systolic yesterday evening. We will continue usual blood pressure medications but with hold parameters. He is on Norvasc, Lopressor and lisinopril.   CORONARY ARTERY DISEASE Currently appears asymptomatic without any endorsed complaints of chest pain nor EKG changes. We will continue low-dose aspirin, Plavix, Lopressor as prior to admission.   Memory loss/Dementia/Delirium, acute Preadmission medications include Aricept. He has memory loss listed as a chronic problem and likely has underlying dementia. Do to acute illness and change in physical location patient has developed a mild degree of acute delirium. We will continue to monitor patient for progression of this problem. Would like to avoid benzodiazepine treatment if possible since this will likely worsen his acute delirium given his advanced age. As soon as he is stable enough from a respiratory standpoint would like to initiate mobilization therapy to include PT and OT evaluations.  Disposition Appropriate to transfer to non-telemetry medical floor. PT recommends CIR evaluation since family desires to take  patient home.  LOS: 3 days   Junious Silk, ANP pager [phone]  [facility] 8 [email or web address] Password: [email or web address] 08/20/2011, 11:36 AM  Addendum 1310: Received call from RN. Patient had 13 beat run of ventricular tachycardia but was asymptomatic/hemodynamically stable. Potassium today is 4.5. We'll check a magnesium level and replete if necessary. Echocardiogram in 2009 shows preserved LV function. Will transfer out of stepdown today as planned but will change status to telemetry instead.  I have personally examined this patient and reviewed the entire database. I have reviewed the above note, made any necessary editorial changes, and agree with its content.  Lonia Blood, MD [facility]

## 2011-08-20 NOTE — Progress Notes (Signed)
Patient had 13 runs of V-tach, Ms. Junious Silk, NP is made aware. No new order noted.

## 2011-08-20 NOTE — Progress Notes (Signed)
08/20/11 1635  Pt. Transferred from 2600 via bed by nurse; wife with pt. and friend;pt. Very quiet;[name];generalized weakness esp. legs-hx. of old stroke; pt. from [facility]. Wife hoping pt. To go inpt. Rehab. Here at [facility]. Skin intact except redness bil. Groins and peri area-using barrier cream and microguard powder. Leandrew Koyanagi Jeannette Maddy,RN

## 2011-08-21 DIAGNOSIS — R5381 Other malaise: Secondary | ICD-10-CM

## 2011-08-21 DIAGNOSIS — I6330 Cerebral infarction due to thrombosis of unspecified cerebral artery: Secondary | ICD-10-CM

## 2011-08-21 LAB — GLUCOSE, CAPILLARY
Glucose-Capillary: 139 mg/dL — ABNORMAL HIGH (ref 70–99)
Glucose-Capillary: 126 mg/dL — ABNORMAL HIGH (ref 70–99)

## 2011-08-21 MED ORDER — INSULIN ASPART 100 UNIT/ML ~~LOC~~ SOLN
0.0000 [IU] | Freq: Three times a day (TID) | SUBCUTANEOUS | Status: DC
  Administered 2011-08-22: 2 [IU] via SUBCUTANEOUS
  Administered 2011-08-22: 1 [IU] via SUBCUTANEOUS
  Filled 2011-08-21: qty 3

## 2011-08-21 MED ORDER — PREDNISONE 10 MG PO TABS: 10.0000 mg | ORAL_TABLET | Freq: Every day | ORAL | Status: DC

## 2011-08-21 MED ORDER — CLOPIDOGREL BISULFATE 75 MG PO TABS
75.0000 mg | ORAL_TABLET | Freq: Every day | ORAL | Status: DC
  Administered 2011-08-22 – 2011-08-23 (×2): 75 mg via ORAL
  Filled 2011-08-21 (×3): qty 1

## 2011-08-21 MED ORDER — METHOCARBAMOL 100 MG/ML IJ SOLN
500.0000 mg | INTRAMUSCULAR | Status: DC
  Filled 2011-08-21: qty 5

## 2011-08-21 MED ORDER — PREDNISONE 20 MG PO TABS
20.0000 mg | ORAL_TABLET | Freq: Two times a day (BID) | ORAL | Status: AC
  Administered 2011-08-22: 20 mg via ORAL
  Filled 2011-08-21: qty 1

## 2011-08-21 MED ORDER — MAGNESIUM OXIDE 400 MG PO TABS
200.0000 mg | ORAL_TABLET | Freq: Two times a day (BID) | ORAL | Status: DC
  Administered 2011-08-21 – 2011-08-23 (×4): 200 mg via ORAL
  Filled 2011-08-21 (×5): qty 0.5

## 2011-08-21 MED ORDER — PREDNISONE 10 MG PO TABS
10.0000 mg | ORAL_TABLET | Freq: Two times a day (BID) | ORAL | Status: DC
  Administered 2011-08-22 – 2011-08-23 (×2): 10 mg via ORAL
  Filled 2011-08-21 (×4): qty 1

## 2011-08-21 MED ORDER — PANTOPRAZOLE SODIUM 40 MG PO TBEC
40.0000 mg | DELAYED_RELEASE_TABLET | Freq: Every day | ORAL | Status: DC
  Administered 2011-08-22: 40 mg via ORAL
  Filled 2011-08-21: qty 1

## 2011-08-21 MED ORDER — PREDNISONE 5 MG PO TABS: 5.0000 mg | ORAL_TABLET | Freq: Every day | ORAL | Status: DC

## 2011-08-21 MED ORDER — GUAIFENESIN ER 600 MG PO TB12
600.0000 mg | ORAL_TABLET | Freq: Two times a day (BID) | ORAL | Status: DC | PRN
  Filled 2011-08-21: qty 1

## 2011-08-21 NOTE — Progress Notes (Signed)
The patient is receiving Protonix by the intravenous route.  Based on criteria approved by the Pharmacy and Therapeutics Committee and the Medical Executive Committee, the medication is being converted to the equivalent oral dose form.  These criteria include: -No Active GI bleeding -Able to tolerate diet of full liquids (or better) or tube feeding -Able to tolerate other medications by the oral or enteral route  If you have any questions about this conversion, please contact the Pharmacy Department (ext [phone]).  Thank you.   Christoper Fabian, PharmD 08/21/11 [phone]

## 2011-08-21 NOTE — Progress Notes (Signed)
Patient's family open to SNF search- prefer not to return to [facility] where he was just released. Will fax out FL2 and seek offers. Reece Levy, MSW, Theresia Majors [phone]

## 2011-08-21 NOTE — Progress Notes (Addendum)
Subjective: Awake, denies chest pain or dyspnea. Some what confused.  Objective: Filed Vitals:   08/20/11 2058 08/21/11 0439 08/21/11 1054 08/21/11 1348  BP: 136/68 145/64 143/68 148/76  Pulse: 77 69 69 69  Temp: 98.8 F (37.1 C) 98.5 F (36.9 C)  97.6 F (36.4 C)  TempSrc: Oral Oral  Oral  Resp: 19 20  20   Height:      Weight:  78.8 kg (173 lb 11.6 oz)    SpO2: 95% 99%  96%   Weight change: 1.1 kg (2 lb 6.8 oz)  Intake/Output Summary (Last 24 hours) at 08/21/11 1731 Last data filed at 08/21/11 1300  Gross per 24 hour  Intake    320 ml  Output   1100 ml  Net   -780 ml    General: Alert, awake, oriented x3, in no acute distress.  HEENT: No bruits, no goiter.  Heart: Regular rate and rhythm, without murmurs, rubs, gallops.  Lungs: Crackles bl, bilateral air movement.  Abdomen: Soft, nontender, nondistended, positive bowel sounds.  Neuro: Grossly intact, nonfocal. Extremities; No edema.   Lab Results:  Basename 08/20/11 1332 08/20/11 0405 08/19/11 0348  NA -- 135 136  K -- 4.5 4.5  CL -- 104 102  CO2 -- 24 24  GLUCOSE -- 173* 157*  BUN -- 33* 35*  CREATININE -- 0.93 1.09  CALCIUM -- 9.2 9.4  MG 1.8 -- 2.3  PHOS -- -- 2.8    Basename 08/19/11 0348  WBC 13.8*  NEUTROABS --  HGB 11.4*  HCT 34.0*  MCV 90.9  PLT 180    Basename 08/20/11 1200  HGBA1C 6.7*     Micro Results: Recent Results (from the past 240 hour(s))  CULTURE, BLOOD (ROUTINE X 2)     Status: Normal (Preliminary result)   Collection Time   08/17/11  5:12 PM      Component Value Range Status Comment   Specimen Description BLOOD RIGHT ARM   Final    Special Requests BOTTLES DRAWN AEROBIC AND ANAEROBIC 10CC   Final    Setup Time [id number]   Final    Culture     Final    Value:        BLOOD CULTURE RECEIVED NO GROWTH TO DATE CULTURE WILL BE HELD FOR 5 DAYS BEFORE ISSUING A FINAL NEGATIVE REPORT   Report Status PENDING   Incomplete   CULTURE, BLOOD (ROUTINE X 2)     Status: Normal  (Preliminary result)   Collection Time   08/17/11  5:23 PM      Component Value Range Status Comment   Specimen Description BLOOD RIGHT HAND   Final    Special Requests BOTTLES DRAWN AEROBIC ONLY 10CC   Final    Setup Time [id number]   Final    Culture     Final    Value:        BLOOD CULTURE RECEIVED NO GROWTH TO DATE CULTURE WILL BE HELD FOR 5 DAYS BEFORE ISSUING A FINAL NEGATIVE REPORT   Report Status PENDING   Incomplete   MRSA PCR SCREENING     Status: Normal   Collection Time   08/17/11 11:42 PM      Component Value Range Status Comment   MRSA by PCR NEGATIVE  NEGATIVE  Final   CULTURE, BLOOD (ROUTINE X 2)     Status: Normal (Preliminary result)   Collection Time   08/18/11  6:02 PM      Component Value Range Status Comment  Specimen Description BLOOD RIGHT ARM   Final    Special Requests BOTTLES DRAWN AEROBIC AND ANAEROBIC 10CC   Final    Setup Time [id number]   Final    Culture     Final    Value:        BLOOD CULTURE RECEIVED NO GROWTH TO DATE CULTURE WILL BE HELD FOR 5 DAYS BEFORE ISSUING A FINAL NEGATIVE REPORT   Report Status PENDING   Incomplete   CULTURE, BLOOD (ROUTINE X 2)     Status: Normal (Preliminary result)   Collection Time   08/18/11  6:12 PM      Component Value Range Status Comment   Specimen Description BLOOD RIGHT HAND   Final    Special Requests BOTTLES DRAWN AEROBIC AND ANAEROBIC 10CC   Final    Setup Time [id number]   Final    Culture     Final    Value:        BLOOD CULTURE RECEIVED NO GROWTH TO DATE CULTURE WILL BE HELD FOR 5 DAYS BEFORE ISSUING A FINAL NEGATIVE REPORT   Report Status PENDING   Incomplete     Studies/Results: No results found.  Medications: I have reviewed the patient's current medications.  Chronic respiratory failure secondary to COPD and Asbestosis OBSTRUCTIVE SLEEP APNEA   Continue supportive care with nebulizer treatments, prednisone taper. CIPAP for sleep apnea.  Respiratory status stable.  Continue with  Levaquin for total 7 days.  Chronic Atrial fibrillation not a Coumadin candidate secondary to fall risk  Currently is rate controlled. Continue with metoprolol.  Hyperglycemia  Likely due to steroid therapy. HB A1c at 6.7. Continue with SSI.  Leukocytosis/Thrombocytopenia  Presented with low platelets likely from acute viral illness. This has improved with initiation of empiric steroid therapy for respiratory decompensation. Leukocytosis without fever has developed after admission and is likely due to steroid therapy.  Intravascular volume depletion/Hypotension  Hypotension resolved. I will NSL to avoid volume overload. Restarted on BP medications.  HYPERTENSION  Continue with lisinopril, norvasc and metoprolol.  CORONARY ARTERY DISEASE  We will continue low-dose aspirin, Plavix, Lopressor as prior to admission.  Memory loss/Dementia/Delirium, acute  Preadmission medications include Aricept. He has memory loss listed as a chronic problem and likely has underlying dementia. Do to acute illness and change in physical location patient has developed a mild degree of acute delirium. Stable.  Would like to avoid benzodiazepine treatment if possible since this will likely worsen his acute delirium given his advanced age. Dispo: SNF when bed available.     LOS: 4 days   Lennell Shanks M.D.  [facility] 08/21/2011, 5:31 PM  Runs of VT 12-19: no more episode reported. Replete magnesium. Repeat mg in am.

## 2011-08-21 NOTE — Consult Note (Signed)
Physical Medicine and Rehabilitation Consult Reason for Consult: Weakness, respiratory failure Referring Phsyician: Dr. Sharon Seller    HPI: [name] is an 77 y.o. male with history of dementia, recent CVA 12/10, recent URI, admitted with hypoxia and respiratory distress.  Placed on NIPPV in ED and then required CPAP due to woresning on 12/17.  On steroids and antibiotics for treatment.  Has had issues with delirium that's resolving per reports.   PT evaluation done yesterday.  Patient with poor mobility requiring max verbal and tactile cues for sit to stand.  MD, PT, family requesting CIR.  Review of Systems  Unable to perform ROS: dementia  Respiratory: Negative for shortness of breath.   Cardiovascular: Negative for chest pain and palpitations.   Past Medical History  Diagnosis Date  . Encounter for long-term (current) use of anticoagulants 12/09/2010  . Hypoxemia 05/15/2009  . Atrial fibrillation 01/19/2009  . CORONARY ARTERY DISEASE 10/07/2007  . AORTIC STENOSIS, SEVERE 10/07/2007  . PVD 10/07/2007  . HYPERLIPIDEMIA 10/07/2007  . HYPERTENSION 10/07/2007  . CEREBROVASCULAR DISEASE 10/07/2007  . DYSPNEA 02/14/2008  . OBSTRUCTIVE SLEEP APNEA 10/07/2007  . Asbestosis 02/21/2008  . History of colon cancer   . DDD (degenerative disc disease)     low back  . Colon polyp   . Pneumonia    Past Surgical History  Procedure Date  . Abdominal aortic aneurysm repair 1992  . Coronary artery bypass graft 2005  . Aortic valve replacement 2005  . Knee surgery     left  . Eye surgery    Family History  Problem Relation Age of Onset  . COPD    . Colon cancer    . Lung cancer    . Heart disease    . Coronary artery disease    . Stroke Mother 5  . Heart disease Father    Social History: Married. Discharged from [facility] one day [facility]TA.  Non ambulatory since CVA and required max assist for transfere.  Per chart pt  quit smoking about 37 years ago. His smoking use included Cigarettes. He has a 20  pack-year smoking history. He has never used smokeless tobacco. He does not drink alcohol or use illicit drugs.   Allergies  Allergen Reactions  . Morphine Nausea And Vomiting  . Ultram (Tramadol Hcl) Other (See Comments)    Made "wild"    Prior to Admission medications   Medication Sig Start Date End Date Taking? Authorizing Provider  amLODipine (NORVASC) 5 MG tablet Take 5 mg by mouth daily.   01/14/11 01/14/12 Yes Bruce W Burchette  arformoterol (BROVANA) 15 MCG/2ML NEBU Take 15 mcg by nebulization 2 (two) times daily.    Yes Historical Provider, MD  aspirin 81 MG EC tablet Take 81 mg by mouth daily.   07/07/11 07/06/12 Yes Annie Main, NP  budesonide (PULMICORT) 0.25 MG/2ML nebulizer solution Take 0.25 mg by nebulization 2 (two) times daily.     Yes Historical Provider, MD  ciprofloxacin (CIPRO) 250 MG tablet Take 250 mg by mouth 2 (two) times daily. For 10 days.  Started about a week ago.    Yes Historical Provider, MD  clopidogrel (PLAVIX) 75 MG tablet Take 75 mg by mouth daily with breakfast.   07/07/11 07/06/12 Yes Annie Main, NP  donepezil (ARICEPT) 5 MG tablet Take 5 mg by mouth daily.     Yes Historical Provider, MD  furosemide (LASIX) 40 MG tablet Take 40 mg by mouth every other day.  12/13/10  Yes Maisie Fus  Wall, MD  lansoprazole (PREVACID) 30 MG capsule Take 30 mg by mouth daily.     Yes Historical Provider, MD  lisinopril (PRINIVIL,ZESTRIL) 40 MG tablet Take 40 mg by mouth daily.     Yes Historical Provider, MD  metoprolol tartrate (LOPRESSOR) 25 MG tablet Take 25 mg by mouth daily.     Yes Historical Provider, MD  Multiple Vitamin (MULTIVITAMIN) tablet Take 1 tablet by mouth daily.    Yes Historical Provider, MD  potassium chloride (KLOR-CON) 10 MEQ CR tablet Take 20 mEq by mouth daily.     Yes Historical Provider, MD  pravastatin (PRAVACHOL) 40 MG tablet Take 40 mg by mouth at bedtime.    Yes Historical Provider, MD  Probiotic Product (PROBIOTIC PO) Take 1 tablet by mouth daily.      Yes Historical Provider, MD   Scheduled Medications:    . amLODipine  5 mg Oral Daily  . aspirin  81 mg Oral Daily  . clopidogrel  75 mg Oral Q breakfast  . donepezil  5 mg Oral QHS  . feeding supplement  237 mL Oral BID BM  . insulin aspart  0-9 Units Subcutaneous TID WC  . levofloxacin  500 mg Oral Daily  . lisinopril  40 mg Oral Daily  . metoprolol tartrate  25 mg Oral BID  . pantoprazole (PROTONIX) IV  40 mg Intravenous Daily  . predniSONE  20 mg Oral BID WC   Followed by  . predniSONE  10 mg Oral BID WC   Followed by  . predniSONE  10 mg Oral Q breakfast   Followed by  . predniSONE  5 mg Oral Q breakfast  . simvastatin  20 mg Oral q1800  . DISCONTD: antiseptic oral rinse  15 mL Mouth Rinse BID  . DISCONTD: enoxaparin  40 mg Subcutaneous Daily  . DISCONTD: methocarbamol(ROBAXIN) IV  500 mg Intravenous To PACU  . DISCONTD: methylPREDNISolone (SOLU-MEDROL) injection  40 mg Intravenous Q12H   PRN MED's: albuterol, ipratropium Home: Home Living Lives With: Spouse Receives Help From: Family Type of Home: House Home Layout: One level Home Access: Stairs to enter Entrance Stairs-Rails: None Entrance Stairs-Number of Steps: 2 Home Adaptive Equipment: Hospital bed;Bedside commode/3-in-1;Walker - rolling  Functional History: Prior Function Level of Independence: Needs assistance with tranfers (Nonambulatory since CVA in Oct. Max assist for transfers.) Driving: No Vocation: Retired Comments: Pt was home just one day from SNF before return to hospital.  Dtr. needs pt to be more mobile before return home. Functional Status:  Mobility: Bed Mobility Bed Mobility: Yes Supine to Sit: Patient percentage (comment);1: +2 Total assist (pt = 40%) Supine to Sit Details (indicate cue type and reason): vebal/tactile cues and manual facilitation for technique. Transfers Transfers: Yes Sit to Stand: 1: +2 Total assist;From bed;Without upper extremity assist;Patient percentage (comment)  (Pt = 40-50%.  using arms to pull up on walker/stedy) Sit to Stand Details (indicate cue type and reason): Verbal/tactile cues and manual facilitation to bring weight anteriorly for sit to stand. Stand to Sit: 1: +2 Total assist;Patient percentage (comment);To bed;To chair/3-in-1 (Pt = 40%) Stand to Sit Details: Manual facilitation to flex at hips to sit. Transfer via Lift Equipment: Stedy      ADL:    Cognition: Cognition Arousal/Alertness: Awake/alert Orientation Level: Oriented to person;Oriented to place Cognition Arousal/Alertness: Awake/alert Overall Cognitive Status: Impaired Attention: Impaired Current Attention Level: Sustained Orientation Level: Oriented to person;Oriented to place Following Commands: Follows one step commands consistently;Follows multi-step commands inconsistently Problem Solving:  Requires assistance for problem solving  Blood pressure 145/64, pulse 69, temperature 98.5 F (36.9 C), temperature source Oral, resp. rate 20, height 5\' 9"  (1.753 m), weight 78.8 kg (173 lb 11.6 oz), SpO2 99.00%. Physical Exam  Constitutional: He appears well-developed and well-nourished.  Neck: Normal range of motion.  Cardiovascular: Normal rate.        Irregularly irregular  Pulmonary/Chest: Effort normal and breath sounds normal.  Abdominal: Soft. Bowel sounds are normal.  Musculoskeletal: Normal range of motion.  Neurological: He is alert.       Oriented to self only.  Confused.  [name].  Needs cues to follow basic commands.  Moves all four without signs of focal weakness. Speech quality generally good although content poor peer  Psychiatric:       Poor insight. Poor awareness.    Results for orders placed during the hospital encounter of 08/17/11 (from the past 24 hour(s))  HEMOGLOBIN A1C     Status: Abnormal   Collection Time   08/20/11 12:00 PM      Component Value Range   Hemoglobin A1C 6.7 (*) <5.7 (%)   Mean Plasma Glucose 146 (*) <117 (mg/dL)  GLUCOSE,  CAPILLARY     Status: Abnormal   Collection Time   08/20/11 12:24 PM      Component Value Range   Glucose-Capillary 146 (*) 70 - 99 (mg/dL)   Comment 1 Documented in Chart     Comment 2 Notify RN    MAGNESIUM     Status: Normal   Collection Time   08/20/11  1:32 PM      Component Value Range   Magnesium 1.8  1.5 - 2.5 (mg/dL)  GLUCOSE, CAPILLARY     Status: Abnormal   Collection Time   08/20/11  6:47 PM      Component Value Range   Glucose-Capillary 188 (*) 70 - 99 (mg/dL)  GLUCOSE, CAPILLARY     Status: Abnormal   Collection Time   08/21/11  7:40 AM      Component Value Range   Glucose-Capillary 123 (*) 70 - 99 (mg/dL)   No results found.  Assessment/Plan: Diagnosis: Deconditioning after recent respiratory failure. Patient also with recent stroke. Significant dementia. 1. Does the need for close, 24 hr/day medical supervision in concert with the patient's rehab needs make it unreasonable for this patient to be served in a less intensive setting? No 2. Co-Morbidities requiring supervision/potential complications: See above 3. Due to bladder management and bowel management, does the patient require 24 hr/day rehab nursing? No 4. Does the patient require coordinated care of a physician, rehab nurse, PT, OT, speech to address physical and functional deficits in the context of the above medical diagnosis(es)? No Addressing deficits in the following areas: balance, bathing, cognition and transferring 5. Can the patient actively participate in an intensive therapy program of at least 3 hrs of therapy per day at least 5 days per week? No 6. The potential for patient to make measurable gains while on inpatient rehab is Not applicable 7. Anticipated functional outcomes upon discharge from inpatients are not applicable 8. Estimated rehab length of stay to reach the above functional goals is: Not applicable 9. Does the patient have adequate social supports to accommodate these discharge  functional goals? Not applicable 10. Anticipated D/C setting: Not app 11. Anticipated post D/C treatments: HH therapy 12. Overall Rehab/Functional Prognosis: fair  RECOMMENDATIONS: This patient's condition is appropriate for continued rehabilitative care in the following setting: SNF Patient has  agreed to participate in recommended program. Not applicable Note that insurance prior authorization may be required for reimbursement for recommended care.  Comment: Patient with severe dementia. Recently discharged from skilled nursing facility. Not an inpatient rehabilitation candidate.   08/21/2011

## 2011-08-21 NOTE — Progress Notes (Signed)
Physical Therapy Treatment Patient Details Name: [name] MRN: [id number] DOB: 1922/09/01 Today's Date: 08/21/2011  PT Assessment/Plan  PT - Assessment/Plan Comments on Treatment Session: Pt. continues to require +2 total assist for bed mobility and transfers using the stedy.  Pt. with difficulty following commands and impaired cognitive status limits participation.  Pt. will benefit from SNF in order to increase pt.'s functional mobility and decrease the burden of care for his family. PT Plan: Discharge plan needs to be updated;Frequency needs to be updated PT Frequency: Min 2X/week Follow Up Recommendations: Skilled nursing facility Equipment Recommended: Defer to next venue PT Goals  Acute Rehab PT Goals PT Goal: Supine/Side to Sit - Progress: Not met PT Goal: Sit to Supine/Side - Progress: Other (comment) (Not addressed today. ) PT Goal: Sit to Stand - Progress: Progressing toward goal PT Goal: Stand to Sit - Progress: Progressing toward goal PT Goal: Stand - Progress: Not met PT Goal: Ambulate - Progress: Other (comment) (Not addressed today. )  PT Treatment Precautions/Restrictions  Precautions Precautions: Fall Mobility (including Balance) Bed Mobility Bed Mobility: Yes Rolling Left: 1: +2 Total assist;Patient percentage (comment);With rail (Pt. = 30%) Rolling Left Details (indicate cue type and reason): Verbal and tactile cueing for hand placement and initiation.  Left Sidelying to Sit: 1: +2 Total assist;Patient percentage (comment) (Pt. = 20%) Left Sidelying to Sit Details (indicate cue type and reason): Max verbal and tactile cueing for initiation.   Sitting - Scoot to Edge of Bed: 1: +2 Total assist;Patient percentage (comment) (Pt. = 30%) Sitting - Scoot to Edge of Bed Details (indicate cue type and reason): Pt. requiring blocking at knees secondary to posterior lean and total assist to scoot to EOB.  Transfers Transfers: Yes Sit to Stand: 1: +2 Total assist;From  bed;With upper extremity assist;Patient percentage (comment) (Pt. = 70% bed to stedy, 30% when standing from stedy) Sit to Stand Details (indicate cue type and reason): Verbal and tactile cueing to pull up on stedy and to lean forward and stand.  Assist for anterior translation.  Stand to Sit: 1: +2 Total assist;To chair/3-in-1;With upper extremity assist;Patient percentage (comment) (Pt. = 40%) Stand to Sit Details: Verbal and tactile cueing for hip flexion and assist for control of descent.  Transfer via Lift Equipment: Education officer, community Sitting - Level of Assistance: 4: Min assist Static Sitting - Comment/# of Minutes: 3 minutes.  Patient pulling feet up off of ground and with posterior lean.  Exercise  General Exercises - Lower Extremity Long Arc Quad: AROM;Both;10 reps;Seated End of Session PT - End of Session Equipment Utilized During Treatment: Gait belt;Other (comment) Antony Salmon) Activity Tolerance: Patient tolerated treatment well Patient left: in chair;with call bell in reach Nurse Communication: Mobility status for transfers;Need for lift equipment General Behavior During Session: [facility] for tasks performed Cognition: Impaired Cognitive Impairment: Pt. disoriented to time, place and situation and generally confused.  Follows commands inconsistently (50%).   Laney Pastor, SPT  08/21/2011, 5:20 PM

## 2011-08-21 NOTE — Progress Notes (Signed)
Patient place on cpap of 5cmH20. Patient is tolerating settings. RT will continue to monitor.

## 2011-08-21 NOTE — Progress Notes (Signed)
Patient admitted from home- per family, had been home from SNF stay at [facility] for 1 night. Will need to have PT/OT evaluations for d/c recommendations. CSW Psychosocial Assessment placed in shadow chart in [facility]. Will proceed with SNF search and advise. Reece Levy, MSW, Theresia Majors [phone]

## 2011-08-21 NOTE — Progress Notes (Signed)
   CARE MANAGEMENT NOTE 08/21/2011  Patient:  [name], [name]   Account Number:  [id number]  Date Initiated:  08/21/2011  Documentation initiated by:  Letha Cape  Subjective/Objective Assessment:   dx acute resp failure with hypoxia  admit- Patient just dc from [facility] for one night.     Action/Plan:   pt/ot eval.   Anticipated DC Date:  08/23/2011   Anticipated DC Plan:  SKILLED NURSING FACILITY  In-house referral  Clinical Social Worker      DC Planning Services  CM consult      Choice offered to / List presented to:             Status of service:  In process, will continue to follow Medicare Important Message given?   (If response is "NO", the following Medicare IM given date fields will be blank) Date Medicare IM given:   Date Additional Medicare IM given:    Discharge Disposition:    Per UR Regulation:    Comments:  PCP Evelena Peat  08/21/11 13:49 Letha Cape RN, BSN [phone] Per CSW 's note, patient has been home for one day only from [facility] snf, will need pt/ot eval.

## 2011-08-21 NOTE — Progress Notes (Signed)
Replaced posey gloves on patient after he managed to remove them and remove his condom catheter. I asked the charge nurse for a camera room for this patient. Will continue to monitor.

## 2011-08-21 NOTE — Progress Notes (Signed)
Patient not an inpatient acute rehabilitaton candidate. Recommend SNF. Please call me with any questions. Pager [phone]

## 2011-08-21 NOTE — Progress Notes (Signed)
Seen and agree with SPT note Hazelyn Kallen Tabor, PT [phone]  

## 2011-08-22 DIAGNOSIS — E871 Hypo-osmolality and hyponatremia: Secondary | ICD-10-CM

## 2011-08-22 LAB — BASIC METABOLIC PANEL
Calcium: 9.5 mg/dL (ref 8.4–10.5)
GFR calc non Af Amer: 76 mL/min — ABNORMAL LOW (ref >90)
Glucose, Bld: 123 mg/dL — ABNORMAL HIGH (ref 70–99)
Sodium: 131 mEq/L — ABNORMAL LOW (ref 135–145)

## 2011-08-22 LAB — GLUCOSE, CAPILLARY: Glucose-Capillary: 181 mg/dL — ABNORMAL HIGH (ref 70–99)

## 2011-08-22 MED ORDER — IPRATROPIUM BROMIDE 0.02 % IN SOLN: 0.5000 mg | Freq: Four times a day (QID) | RESPIRATORY_TRACT | Status: DC | PRN

## 2011-08-22 MED ORDER — INSULIN ASPART 100 UNIT/ML ~~LOC~~ SOLN: 0.0000 [IU] | Freq: Three times a day (TID) | SUBCUTANEOUS | Status: DC

## 2011-08-22 MED ORDER — LEVOFLOXACIN 500 MG PO TABS: 500.0000 mg | ORAL_TABLET | Freq: Every day | ORAL | Status: AC

## 2011-08-22 MED ORDER — PREDNISONE 1 MG PO TABS: 10.0000 mg | ORAL_TABLET | Freq: Every day | ORAL | Status: AC

## 2011-08-22 MED ORDER — FUROSEMIDE 20 MG PO TABS
20.0000 mg | ORAL_TABLET | Freq: Every day | ORAL | Status: DC
  Administered 2011-08-22: 20 mg via ORAL
  Filled 2011-08-22: qty 1

## 2011-08-22 MED ORDER — GUAIFENESIN ER 600 MG PO TB12: 600.0000 mg | ORAL_TABLET | Freq: Two times a day (BID) | ORAL | Status: DC | PRN

## 2011-08-22 MED ORDER — SODIUM CHLORIDE 0.9 % IV SOLN
INTRAVENOUS | Status: DC
  Administered 2011-08-22: 1000 mL via INTRAVENOUS
  Administered 2011-08-23: 05:00:00 via INTRAVENOUS

## 2011-08-22 MED ORDER — ALBUTEROL SULFATE (5 MG/ML) 0.5% IN NEBU: 2.5000 mg | INHALATION_SOLUTION | Freq: Four times a day (QID) | RESPIRATORY_TRACT | Status: DC | PRN

## 2011-08-22 MED ORDER — ENSURE CLINICAL ST REVIGOR PO LIQD: 237.0000 mL | Freq: Two times a day (BID) | ORAL | Status: AC

## 2011-08-22 MED ORDER — PANTOPRAZOLE SODIUM 40 MG PO TBEC: 40.0000 mg | DELAYED_RELEASE_TABLET | Freq: Every day | ORAL | Status: DC

## 2011-08-22 NOTE — Progress Notes (Signed)
08/22/11 NSG 1510 Informed by NSMT that pt. HR dropped down to 35 and came right back up.  Verdon Cummins, RN made aware and stated that pt. Was asymptomatic.  Informed Algis Downs, PA, she was going to call MD since pt. Was scheduled to be d/c.  Will continue to monitor.  SW already call ambulance.  Forbes Cellar, RN

## 2011-08-22 NOTE — Progress Notes (Signed)
Patient heart rate decrease to 30 , he was asymptomatic. His BP decrease to 87 then manually was at 100. I explain to the family I want to cancelled discharge to see how patient does off BP medications.  Of note wife relates patient is a full CODE, and she wants aggressive measure. They agree to stay one more night, but they would like a second opinion, to see if patient really needs to stay.

## 2011-08-22 NOTE — Progress Notes (Signed)
RT placed patient on cpap auto 5cmH20-15cmH20. Patient wore cpap for about 15 mins then took the mask off complaining of not being able to breathe. Pt then refused cpap. RT will continue to monitor.

## 2011-08-22 NOTE — Progress Notes (Signed)
Patient's d/c delayed this pm due to BP drop- advised SNF and will tentatively plan for d/c tomorrow- family also aware- Reece Levy, MSW, Theresia Majors [phone]

## 2011-08-22 NOTE — Progress Notes (Signed)
SNF bed accepted at [facility]- Have also secured [facility] auth for EMS and SNF- [id number]. Spoke with MD and PA-C and will await final word for d/c today. Reece Levy, MSW, Theresia Majors [phone]

## 2011-08-22 NOTE — Progress Notes (Addendum)
Patient ID: [name], male   DOB: 09-22-1921, 77 y.o.   MRN: [id number] Subjective: Awake, reports he's breathing better.  Asks me if there is a snake in the corner of his room. Objective: Filed Vitals:   08/21/11 1348 08/21/11 2239 08/22/11 0530 08/22/11 0700  BP: 148/76 162/85 148/76   Pulse: 69 84 77   Temp: 97.6 F (36.4 C) 97.8 F (36.6 C) 98.4 F (36.9 C)   TempSrc: Oral Oral Oral   Resp: 20 20 21    Height:      Weight:    76.4 kg (168 lb 6.9 oz)  SpO2: 96% 96% 91%    Weight change: -2.4 kg (-5 lb 4.7 oz)  Intake/Output Summary (Last 24 hours) at 08/22/11 0837 Last data filed at 08/21/11 2300  Gross per 24 hour  Intake    360 ml  Output   1440 ml  Net  -1080 ml    General: Alert, awake, mumbles does not seem to be able to speak clearly, calm in no acute distress, confused (hallucinating?) HEENT: No bruits, no goiter.  Heart: Regular rate and rhythm, without rubs, gallops.  Has systolic murmur. Lungs: expiratory Crackles bl, bilateral air movement.  Abdomen: Soft, nontender, nondistended, positive bowel sounds.  Neuro: Grossly intact, nonfocal. Extremities; No edema.   Tele:  In Afib, no repeat episodes of V-T  Lab Results:  Basename 08/22/11 0700 08/20/11 1332 08/20/11 0405  NA 131* -- 135  K 4.2 -- 4.5  CL 99 -- 104  CO2 27 -- 24  GLUCOSE 123* -- 173*  BUN 24* -- 33*  CREATININE 0.84 -- 0.93  CALCIUM 9.5 -- 9.2  MG 2.0 1.8 --  PHOS -- -- --   No results found for this basename: WBC:2,NEUTROABS:2,HGB:2,HCT:2,MCV:2,PLT:2 in the last 72 hours  Basename 08/20/11 1200  HGBA1C 6.7*     Micro Results: Blood Cultures pending but no growth thus far.  Studies/Results: No results found.  Medications: I have reviewed the patient's current medications.  Chronic respiratory failure secondary to COPD and Asbestosis OBSTRUCTIVE SLEEP APNEA   Continue supportive care with nebulizer treatments, prednisone taper. CIPAP for sleep apnea.  Respiratory status  stable.  Continue with Levaquin for total 7 days.  Chronic Atrial fibrillation not a Coumadin candidate secondary to fall risk  Currently is rate controlled. Continue with metoprolol.  Hyperglycemia  Likely due to steroid therapy. HB A1c at 6.7. Continue with SSI.  Leukocytosis/Thrombocytopenia  Presented with low platelets likely from acute viral illness. This has improved with initiation of empiric steroid therapy for respiratory decompensation. Leukocytosis without fever has developed after admission and is likely due to steroid therapy.  Intravascular volume depletion/Hypotension  Hypotension resolved. I will NSL to avoid volume overload. Restarted on BP medications.  HYPERTENSION  Continue with lisinopril, norvasc and metoprolol.  CORONARY ARTERY DISEASE  We will continue low-dose aspirin, Plavix, Lopressor as prior to admission.  Delirium, acute with history of Dementia Still with mild delirium. Could be from Prednisone or Levaquin.  (discuss with Attending).  Do to acute illness and change in physical location patient has developed a mild degree of acute delirium.  Hyponatremia:  Mild.  Patient's IVF are discontinued.  Will restart his lasix.  Also patient does not appear to be eating on his own will request assistance with feeding.  Dispo: SNF when bed available.     LOS: 5 days   Conley Canal [facility] [phone] 08/22/2011, 8:37 AM  Patient seen and examined.  He denies seen any animal at this time. He is oriented to person and place. He denies shortness of breath. If patient develop hallucination consider Seroquel. I suspect his hallucination is secondary to delirium, will treat for infection. Continue with prednisone taper.  Ok to transfer to SNF.

## 2011-08-22 NOTE — Discharge Summary (Signed)
Patient ID: [name] MRN: [id number] DOB/AGE: 75-Sep-1923 75 y.o.  Admit date: 08/17/2011 Discharge date: 08/22/2011  Primary Care Physician:  Kristian Covey, MD  Discharge Diagnoses:   Present on Admission:  Principal Problem:  *Acute respiratory failure with hypoxia  Viral bronchitis  Leukocytosis  Thrombocytopenia  Intravascular volume depletion  Hypotension  Active Problems:  Delirium, acute  Dementia  Hyponatremia Obstructive Sleep Apnea Hypertension Coronary Artery Disease  Atrial fibrillation  COPD  Asbestosis - Interstitial Lung Disease   Current Discharge Medication List    START taking these medications   Details  albuterol (PROVENTIL) (5 MG/ML) 0.5% nebulizer solution Take 0.5 mLs (2.5 mg total) by nebulization every 6 (six) hours as needed for wheezing or shortness of breath. Qty: 20 mL, Refills: 0    feeding supplement (ENSURE CLINICAL STRENGTH) LIQD Take 237 mLs by mouth 2 (two) times daily between meals.    guaiFENesin (MUCINEX) 600 MG 12 hr tablet Take 1 tablet (600 mg total) by mouth 2 (two) times daily as needed.    insulin aspart (NOVOLOG) 100 UNIT/ML injection Inject 0-9 Units into the skin 3 (three) times daily with meals. Qty: 1 vial, Refills: 0    ipratropium (ATROVENT) 0.02 % nebulizer solution Take 2.5 mLs (0.5 mg total) by nebulization every 6 (six) hours as needed. Qty: 75 mL, Refills: 0    levofloxacin (LEVAQUIN) 500 MG tablet Take 1 tablet (500 mg total) by mouth daily. Qty: 7 tablet, Refills: 0 and    pantoprazole (PROTONIX) 40 MG tablet Take 1 tablet (40 mg total) by mouth daily at 12 noon. Qty: 30 tablet, Refills: 0    predniSONE (DELTASONE) 1 MG tablet Take 10 tablets (10 mg total) by mouth daily. Qty: 8 tablet, Refills: 0      CONTINUE these medications which have NOT CHANGED   Details  amLODipine (NORVASC) 5 MG tablet Take 5 mg by mouth daily.      arformoterol (BROVANA) 15 MCG/2ML NEBU Take 15 mcg by nebulization 2  (two) times daily.     aspirin 81 MG EC tablet Take 81 mg by mouth daily.      budesonide (PULMICORT) 0.25 MG/2ML nebulizer solution Take 0.25 mg by nebulization 2 (two) times daily.      clopidogrel (PLAVIX) 75 MG tablet Take 75 mg by mouth daily with breakfast.      donepezil (ARICEPT) 5 MG tablet Take 5 mg by mouth daily.      furosemide (LASIX) 40 MG tablet Take 40 mg by mouth every other day.     lansoprazole (PREVACID) 30 MG capsule Take 30 mg by mouth daily.      lisinopril (PRINIVIL,ZESTRIL) 40 MG tablet Take 40 mg by mouth daily.      metoprolol tartrate (LOPRESSOR) 25 MG tablet Take 25 mg by mouth daily.      Multiple Vitamin (MULTIVITAMIN) tablet Take 1 tablet by mouth daily.     potassium chloride (KLOR-CON) 10 MEQ CR tablet Take 20 mEq by mouth daily.      pravastatin (PRAVACHOL) 40 MG tablet Take 40 mg by mouth at bedtime.     Probiotic Product (PROBIOTIC PO) Take 1 tablet by mouth daily.      CPAP auto titrate (min: 5, Max: 15) via FFM (per MD order). QHS for Sleep Apnea     STOP taking these medications     ciprofloxacin (CIPRO) 250 MG tablet         Brief H and P: From the admission note:  This is a 77yo M with history of Interstitial Lung Disease who presented with cough, hypoxia, and wheezing. He required NIPPV (BIPAP) in ER and rapidly improved.    Principal Problem:   *Acute respiratory failure with hypoxia/ Viral bronchitis  Did require CPAP/BiPAP during the early a.m. hours of 08/19/2011 and since then has remained on room air. Suspect need for CPAP was likely due to chronic issues related to sleep apnea. Continue empiric Levaquin to cover for infectious process.   Chronic respiratory failure secondary to COPD and Asbestosis OBSTRUCTIVE SLEEP APNEA  Suspect utilizes nocturnal CPAP at home, also unclear if is on nasal cannula oxygen at home.  Will finish steroid course with a quick 6 day prednisone taper.  Is not on chronic prednisone at home.  Continue supportive care with nebulizer treatments as well.   Chronic Atrial fibrillation not a Coumadin candidate secondary to fall risk  Currently is rate controlled after restoration of fluid volume. Stable.   Hyperglycemia  Likely due to steroid therapy. No definitive underlying diabetes diagnosis.  Sliding scale insulin can likely be discontinued when patient is off steroids.  Leukocytosis/Thrombocytopenia  Presented with low platelets likely from acute viral illness. This has improved with initiation of empiric steroid therapy for respiratory decompensation. Leukocytosis without fever has developed after admission and is likely due to steroid therapy.   Intravascular volume depletion/Hypotension  Had a degree of intravascular volume depletion at presentation since he presented with a viral illness. Because of acute respiratory decompensation it was suspected he had a degree of volume overload influencing and therefore was given IV Lasix yesterday. Since that time he had developed recurrent atrial fibrillation with rapid ventricular response and relative hypotension and slightly altered mentation. He was subsequently given 250 cc normal saline bolus and IV fluids continuously at 75 cc per hour on 08/19/2011. His blood pressure improved his IV fluids were discontinued. We have reviewed echocardiogram results the patient has preserved LV function dating back to 2009.   HYPERTENSION - the patient was hypotensive but responded to fluid resuscitation. He has now been restarted on his home blood pressure medications including lisinopril metoprolol and Lasix  CORONARY ARTERY DISEASE  Currently appears asymptomatic without any endorsed complaints of chest pain nor EKG changes. We will continue low-dose aspirin, Plavix, Lopressor.  Memory loss/Dementia/Delirium, acute  The patient has chronic dementia.  He developed acute delirium that has improved over the course of this hospitalization. The  etiology of the delirium is uncertain. It could have been caused by his acute illness or his change in physical location or possibly Levaquin and prednisone.  There is some element of delirium that remains however the patient is calm stable and appropriate for discharge to a skilled nursing facility where he can be observed and safely monitored  Physical Exam on Discharge: General: Alert, awake,  in no acute distress, calm but mildly confused. HEENT: No bruits, no goiter. Heart: Regular rate and rhythm, without murmurs, rubs, gallops. Lungs: Clear to auscultation bilaterally. Abdomen: Soft, nontender, nondistended, positive bowel sounds. Extremities: No clubbing cyanosis or edema with positive pedal pulses. Neuro: Grossly intact, nonfocal.  Filed Vitals:   08/21/11 1348 08/21/11 2239 08/22/11 0530 08/22/11 0700  BP: 148/76 162/85 148/76   Pulse: 69 84 77   Temp: 97.6 F (36.4 C) 97.8 F (36.6 C) 98.4 F (36.9 C)   TempSrc: Oral Oral Oral   Resp: 20 20 21    Height:      Weight:    76.4 kg (168  lb 6.9 oz)  SpO2: 96% 96% 91%      Intake/Output Summary (Last 24 hours) at 08/22/11 1343 Last data filed at 08/21/11 2300  Gross per 24 hour  Intake    240 ml  Output   1140 ml  Net   -900 ml    Basic Metabolic Panel:  Lab 08/22/11 9147 08/20/11 1332 08/20/11 0405 08/19/11 0348 08/18/11 0600  NA 131* -- 135 -- --  K 4.2 -- 4.5 -- --  CL 99 -- 104 -- --  CO2 27 -- 24 -- --  GLUCOSE 123* -- 173* -- --  BUN 24* -- 33* -- --  CREATININE 0.84 -- 0.93 -- --  CALCIUM 9.5 -- 9.2 -- --  MG 2.0 1.8 -- -- --  PHOS -- -- -- 2.8 3.0   Liver Function Tests:  Lab 08/17/11 1721  AST 22  ALT 28  ALKPHOS 90  BILITOT 0.2*  PROT 6.5  ALBUMIN 2.9*   CBC  Lab 08/19/11 0348 08/18/11 0600 08/17/11 1721  WBC 13.8* 10.0 --  NEUTROABS -- -- 4.2  HGB 11.4* 11.9* --  HCT 34.0* 35.7* --  MCV 90.9 92.5 --  PLT 180 128* --   Cardiac Enzymes:  Lab 08/17/11 1722  CKTOTAL 43  CKMB 3.3    CKMBINDEX --  TROPONINI <0.30   CBG:  Lab 08/22/11 1215 08/22/11 0805 08/21/11 2237 08/21/11 1208 08/21/11 0740 08/20/11 1847  GLUCAP 131* 110* 126* 139* 123* 188*   Hemoglobin A1C:  Lab 08/20/11 1200  HGBA1C 6.7*   Significant Diagnostic Studies:  Dg Chest Portable 1 View  08/17/2011  *RADIOLOGY REPORT*.  IMPRESSION: No evidence of acute cardiopulmonary disease.  Cardiomegaly with chronic interstitial markings and calcified pleural plaques.  Original Report Authenticated By: Charline Bills, M.D.   Disposition and Follow-up: Still with slight delirium but calm and stable.  Appropriate for DC to SNF with follow up by the SNF physician.  Mild Hyponatremia and Thrombocytopenia to be followed by SNF.  Patient only on Sliding Scale Insulin due to steroid induced hyperglycemia.    Time spent on Discharge: 45 min   Signed: Stephani Police 08/22/2011, 1:43 PM [phone]  Agree with above. Ok to discharge to SNF.

## 2011-08-22 NOTE — Progress Notes (Signed)
Patient for d/c today to SNF bed at [facility]. [employer] Medicare auth rec'd for SNF and EMS. Plan transfer via EMS. Reece Levy, MSW, Theresia Majors [phone]

## 2011-08-22 NOTE — Plan of Care (Signed)
Problem: Phase II Progression Outcomes Goal: ADLs completed with minimal assistance Outcome: Not Progressing Pt. To go to SNF for rehab. Goal: Discharge plan remains appropriate-arrangements made Outcome: Completed/Met Date Met:  08/22/11 SNF

## 2011-08-22 NOTE — Progress Notes (Signed)
Nutrition Follow-up  Diet Order:  Carbohydrate modified with Ensure Clinical Strength BID PO intake is variable, mostly >50% with many undocumented meals.   Meds: Scheduled Meds:   . amLODipine  5 mg Oral Daily  . aspirin  81 mg Oral Daily  . clopidogrel  75 mg Oral Q breakfast  . donepezil  5 mg Oral QHS  . feeding supplement  237 mL Oral BID BM  . furosemide  20 mg Oral Daily  . insulin aspart  0-9 Units Subcutaneous TID WC  . levofloxacin  500 mg Oral Daily  . lisinopril  40 mg Oral Daily  . magnesium oxide  200 mg Oral BID  . metoprolol tartrate  25 mg Oral BID  . pantoprazole  40 mg Oral Q1200  . predniSONE  20 mg Oral BID WC   Followed by  . predniSONE  10 mg Oral BID WC   Followed by  . predniSONE  10 mg Oral Q breakfast   Followed by  . predniSONE  5 mg Oral Q breakfast  . simvastatin  20 mg Oral q1800  . DISCONTD: clopidogrel  75 mg Oral Q breakfast  . DISCONTD: insulin aspart  0-9 Units Subcutaneous TID WC  . DISCONTD: pantoprazole (PROTONIX) IV  40 mg Intravenous Daily  . DISCONTD: predniSONE  10 mg Oral BID WC  . DISCONTD: predniSONE  10 mg Oral Q breakfast  . DISCONTD: predniSONE  20 mg Oral BID WC  . DISCONTD: predniSONE  5 mg Oral Q breakfast   Continuous Infusions:   . DISCONTD: sodium chloride 1,000 mL (08/21/11 0856)   PRN Meds:.albuterol, guaiFENesin, ipratropium  Labs:  CMP     Component Value Date/Time   NA 131* 08/22/2011 0700   K 4.2 08/22/2011 0700   CL 99 08/22/2011 0700   CO2 27 08/22/2011 0700   GLUCOSE 123* 08/22/2011 0700   BUN 24* 08/22/2011 0700   CREATININE 0.84 08/22/2011 0700   CALCIUM 9.5 08/22/2011 0700   PROT 6.5 08/17/2011 1721   ALBUMIN 2.9* 08/17/2011 1721   AST 22 08/17/2011 1721   ALT 28 08/17/2011 1721   ALKPHOS 90 08/17/2011 1721   BILITOT 0.2* 08/17/2011 1721   GFRNONAA 76* 08/22/2011 0700   GFRAA 89* 08/22/2011 0700     Intake/Output Summary (Last 24 hours) at 08/22/11 0950 Last data filed at 08/21/11 2300  Gross per 24 hour  Intake    360 ml  Output   1440 ml  Net  -1080 ml    Weight Status:  168 lbs, weight has been variable up and down likely related to fluid changes.   Nutrition Dx:  unintended weight loss, improving  Goal:  Patient to meet estimated need through PO intake, ongoing  Intervention:  Continue with Ensure Clinical Strength BID  Monitor:  Weight trends, PO intake, labs   Rudean Haskell Pager #:  [phone]

## 2011-08-23 ENCOUNTER — Inpatient Hospital Stay
Admission: RE | Admit: 2011-08-23 | Discharge: 2011-09-24 | Disposition: A | Discharge status: Nursing Home (Includes ICF) | Payer: Medicare Other | Source: Ambulatory Visit | Attending: Internal Medicine | Admitting: Internal Medicine

## 2011-08-23 DIAGNOSIS — J189 Pneumonia, unspecified organism: Principal | ICD-10-CM

## 2011-08-23 LAB — BASIC METABOLIC PANEL
BUN: 27 mg/dL — ABNORMAL HIGH (ref 6–23)
CO2: 24 mEq/L (ref 19–32)
Calcium: 9 mg/dL (ref 8.4–10.5)
Creatinine, Ser: 0.92 mg/dL (ref 0.50–1.35)
Glucose, Bld: 124 mg/dL — ABNORMAL HIGH (ref 70–99)

## 2011-08-23 LAB — CBC
HCT: 32.9 % — ABNORMAL LOW (ref 39.0–52.0)
MCH: 30.5 pg (ref 26.0–34.0)
MCV: 91.1 fL (ref 78.0–100.0)
Platelets: 203 10*3/uL (ref 150–400)
RBC: 3.61 MIL/uL — ABNORMAL LOW (ref 4.22–5.81)

## 2011-08-23 NOTE — Discharge Summary (Signed)
Admit date: 08/17/2011 Discharge date: 08/23/2011  Primary Care Physician:  Kristian Covey, MD   Discharge Diagnoses:   .  Active Hospital Problems  Diagnoses Date Noted   . Acute respiratory failure with hypoxia 08/19/2011   . Hyponatremia 08/22/2011   . Delirium, acute 08/19/2011   . Dementia 08/19/2011   . Viral bronchitis 08/19/2011   . Leukocytosis 08/19/2011   . Thrombocytopenia 08/19/2011   . Intravascular volume depletion 08/19/2011   . Hypotension 08/19/2011   . Memory loss 10/05/2010   . COPD 04/30/2010   . Atrial fibrillation 01/19/2009   . Asbestosis 02/21/2008   . OBSTRUCTIVE SLEEP APNEA 10/07/2007   . HYPERTENSION 10/07/2007   . CORONARY ARTERY DISEASE 10/07/2007              DISCHARGE MEDICATION: Current Discharge Medication List    START taking these medications   Details  albuterol (PROVENTIL) (5 MG/ML) 0.5% nebulizer solution Take 0.5 mLs (2.5 mg total) by nebulization every 6 (six) hours as needed for wheezing or shortness of breath. Qty: 20 mL, Refills: 0    feeding supplement (ENSURE CLINICAL STRENGTH) LIQD Take 237 mLs by mouth 2 (two) times daily between meals.    guaiFENesin (MUCINEX) 600 MG 12 hr tablet Take 1 tablet (600 mg total) by mouth 2 (two) times daily as needed.    insulin aspart (NOVOLOG) 100 UNIT/ML injection Inject 0-9 Units into the skin 3 (three) times daily with meals. Qty: 1 vial, Refills: 0    ipratropium (ATROVENT) 0.02 % nebulizer solution Take 2.5 mLs (0.5 mg total) by nebulization every 6 (six) hours as needed. Qty: 75 mL, Refills: 0    levofloxacin (LEVAQUIN) 500 MG tablet Take 1 tablet (500 mg total) by mouth daily. Qty: 7 tablet, Refills: 0 and    pantoprazole (PROTONIX) 40 MG tablet Take 1 tablet (40 mg total) by mouth daily at 12 noon. Qty: 30 tablet, Refills: 0    predniSONE (DELTASONE) 1 MG tablet Take 10 tablets (10 mg total) by mouth daily. Qty: 8 tablet, Refills: 0      CONTINUE these medications  which have NOT CHANGED   Details  arformoterol (BROVANA) 15 MCG/2ML NEBU Take 15 mcg by nebulization 2 (two) times daily.     aspirin 81 MG EC tablet Take 81 mg by mouth daily.      budesonide (PULMICORT) 0.25 MG/2ML nebulizer solution Take 0.25 mg by nebulization 2 (two) times daily.      clopidogrel (PLAVIX) 75 MG tablet Take 75 mg by mouth daily with breakfast.      donepezil (ARICEPT) 5 MG tablet Take 5 mg by mouth daily.      lansoprazole (PREVACID) 30 MG capsule Take 30 mg by mouth daily.      Multiple Vitamin (MULTIVITAMIN) tablet Take 1 tablet by mouth daily.     pravastatin (PRAVACHOL) 40 MG tablet Take 40 mg by mouth at bedtime.     Probiotic Product (PROBIOTIC PO) Take 1 tablet by mouth daily.        STOP taking these medications     amLODipine (NORVASC) 5 MG tablet      ciprofloxacin (CIPRO) 250 MG tablet      furosemide (LASIX) 40 MG tablet      lisinopril (PRINIVIL,ZESTRIL) 40 MG tablet      metoprolol tartrate (LOPRESSOR) 25 MG tablet      potassium chloride (KLOR-CON) 10 MEQ CR tablet            Consults:  SIGNIFICANT DIAGNOSTIC STUDIES:  Dg Chest Port 1 View  08/19/2011  *RADIOLOGY REPORT*  Clinical Data: Shortness of breath.  PORTABLE CHEST - 1 VIEW  Comparison: 08/17/2011  Findings: There is no visible endotracheal tube.  The patient has calcified pleural plaques bilaterally.  Heart size and vascularity are normal.  Tortuosity of the thoracic aorta.  No acute infiltrates or effusions.  IMPRESSION: Chronic changes in the lungs with pleural plaques.  No acute abnormalities.  Original Report Authenticated By: Gwynn Burly, M.D.   Dg Chest Portable 1 View  08/17/2011  *RADIOLOGY REPORT*  Clinical Data: Cough, congestion, shortness of breath  PORTABLE CHEST - 1 VIEW  Comparison: 07/03/2011  Findings: Chronic interstitial markings with calcified pleural plaques.  No focal consolidation or frank interstitial edema. No pleural effusion or  pneumothorax.  Cardiomegaly.  IMPRESSION: No evidence of acute cardiopulmonary disease.  Cardiomegaly with chronic interstitial markings and calcified pleural plaques.  Original Report Authenticated By: Charline Bills, M.D.     Recent Results (from the past 240 hour(s))  CULTURE, BLOOD (ROUTINE X 2)     Status: Normal (Preliminary result)   Collection Time   08/17/11  5:12 PM      Component Value Range Status Comment   Specimen Description BLOOD RIGHT ARM   Final    Special Requests BOTTLES DRAWN AEROBIC AND ANAEROBIC 10CC   Final    Setup Time [id number]   Final    Culture     Final    Value:        BLOOD CULTURE RECEIVED NO GROWTH TO DATE CULTURE WILL BE HELD FOR 5 DAYS BEFORE ISSUING A FINAL NEGATIVE REPORT   Report Status PENDING   Incomplete   CULTURE, BLOOD (ROUTINE X 2)     Status: Normal (Preliminary result)   Collection Time   08/17/11  5:23 PM      Component Value Range Status Comment   Specimen Description BLOOD RIGHT HAND   Final    Special Requests BOTTLES DRAWN AEROBIC ONLY 10CC   Final    Setup Time [id number]   Final    Culture     Final    Value:        BLOOD CULTURE RECEIVED NO GROWTH TO DATE CULTURE WILL BE HELD FOR 5 DAYS BEFORE ISSUING A FINAL NEGATIVE REPORT   Report Status PENDING   Incomplete   MRSA PCR SCREENING     Status: Normal   Collection Time   08/17/11 11:42 PM      Component Value Range Status Comment   MRSA by PCR NEGATIVE  NEGATIVE  Final   CULTURE, BLOOD (ROUTINE X 2)     Status: Normal (Preliminary result)   Collection Time   08/18/11  6:02 PM      Component Value Range Status Comment   Specimen Description BLOOD RIGHT ARM   Final    Special Requests BOTTLES DRAWN AEROBIC AND ANAEROBIC 10CC   Final    Setup Time [id number]   Final    Culture     Final    Value:        BLOOD CULTURE RECEIVED NO GROWTH TO DATE CULTURE WILL BE HELD FOR 5 DAYS BEFORE ISSUING A FINAL NEGATIVE REPORT   Report Status PENDING   Incomplete   CULTURE, BLOOD  (ROUTINE X 2)     Status: Normal (Preliminary result)   Collection Time   08/18/11  6:12 PM      Component Value Range Status Comment  Specimen Description BLOOD RIGHT HAND   Final    Special Requests BOTTLES DRAWN AEROBIC AND ANAEROBIC 10CC   Final    Setup Time [id number]   Final    Culture     Final    Value:        BLOOD CULTURE RECEIVED NO GROWTH TO DATE CULTURE WILL BE HELD FOR 5 DAYS BEFORE ISSUING A FINAL NEGATIVE REPORT   Report Status PENDING   Incomplete     BRIEF ADMITTING H & P: From the admission note: This is a 77yo M with history of Interstitial Lung Disease who presented with cough, hypoxia, and wheezing. He required NIPPV (BIPAP) in ER and rapidly improved.   Principal Problem:  *Acute respiratory failure with hypoxia/ Viral bronchitis  Did require CPAP/BiPAP during the early a.m. hours of 08/19/2011 and since then has remained on room air. Suspect need for CPAP was likely due to chronic issues related to sleep apnea. Continue empiric Levaquin to cover for infectious process.  Chronic respiratory failure secondary to COPD and Asbestosis OBSTRUCTIVE SLEEP APNEA  Suspect utilizes nocturnal CPAP at home, also unclear if is on nasal cannula oxygen at home. Will finish steroid course with a quick 6 day prednisone taper. Is not on chronic prednisone at home. Continue supportive care with nebulizer treatments as well.  Chronic Atrial fibrillation not a Coumadin candidate secondary to fall risk  Currently is rate controlled after restoration of fluid volume. Stable.  Hyperglycemia  Likely due to steroid therapy. No definitive underlying diabetes diagnosis. Sliding scale insulin can likely be discontinued when patient is off steroids.  Leukocytosis/Thrombocytopenia  Presented with low platelets likely from acute viral illness. This has improved with initiation of empiric steroid therapy for respiratory decompensation. Leukocytosis without fever has developed after admission and  is likely due to steroid therapy.   Intravascular volume depletion/Hypotension  Had a degree of intravascular volume depletion at presentation since he presented with a viral illness. Because of acute respiratory decompensation it was suspected he had a degree of volume overload influencing and therefore was given IV Lasix. Since that time he had developed recurrent atrial fibrillation with rapid ventricular response and relative hypotension and slightly altered mentation. He was subsequently given 250 cc normal saline bolus and IV fluids continuously at 75 cc per hour on 08/19/2011. His blood pressure improved his IV fluids were discontinued. We have reviewed echocardiogram results the patient has preserved LV function dating back to 2009.   HYPERTENSION - the patient was hypotensive but responded to fluid resuscitation. His blood pressure medication were discontinue. He also had episode of bradycardia, for that reason discharge was delay. Please consider restart low dose metoprolol if patient doesn't have any more bradycardia. Lasix was also discontinue. If sign of fluids overload consider restart lasix.   CORONARY ARTERY DISEASE  Currently appears asymptomatic without any endorsed complaints of chest pain nor EKG changes. We will continue low-dose aspirin, Plavix, Lopressor.  Memory loss/Dementia/Delirium, acute  The patient has chronic dementia. He developed acute delirium that has improved over the course of this hospitalization. The etiology of the delirium is uncertain. It could have been caused by his acute illness or his change in physical location or possibly Levaquin and prednisone. There is some element of delirium that remains however the patient is calm stable and appropriate for discharge to a skilled nursing facility where he can be observed and safely monitored    Disposition and Follow-up: Still with slight delirium but calm and stable. Appropriate  for DC to SNF with follow up by the  SNF physician. Mild Hyponatremia and Thrombocytopenia to be followed by SNF. Patient only on Sliding Scale Insulin due to steroid induced hyperglycemia.      Disposition and Follow-up: Needs Bmet to follow sodium level.  Discharge Orders    Future Appointments: Provider: Department: Dept Phone: Center:   09/25/2011 11:00 AM Elberta Fortis [facility] [phone] [facility]     Future Orders Please Complete By Expires   Diet - low sodium heart healthy      Diet - low sodium heart healthy      Increase activity slowly      Increase activity slowly        Follow-up Information    Follow up with BURCHETTE,BRUCE W .          DISCHARGE EXAM:   Physical Exam on Discharge:  General: Alert, awake, in no acute distress, calm but mildly confused.  HEENT: No bruits, no goiter.  Heart: Regular rate and rhythm, without murmurs, rubs, gallops.  Lungs: Clear to auscultation bilaterally.  Abdomen: Soft, nontender, nondistended, positive bowel sounds.  Extremities: No clubbing cyanosis or edema with positive pedal pulses.  Neuro: Grossly intact, nonfocal.   Blood pressure 133/75, pulse 76, temperature 97 F (36.1 C), temperature source Oral, resp. rate 18, height 5\' 9"  (1.753 m), weight 75.8 kg (167 lb 1.7 oz), SpO2 97.00%.   Basename 08/22/11 0700 08/20/11 1332  NA 131* --  K 4.2 --  CL 99 --  CO2 27 --  GLUCOSE 123* --  BUN 24* --  CREATININE 0.84 --  CALCIUM 9.5 --  MG 2.0 1.8  PHOS -- --     SignedHartley Barefoot M.D. 08/23/2011, 7:54 AM

## 2011-08-23 NOTE — Progress Notes (Signed)
[facility] IV site removed and monitor removed. Automotive engineer . Valeda Malm 08/23/11

## 2011-08-24 LAB — CULTURE, BLOOD (ROUTINE X 2)
Culture  Setup Time: 201212170224
Culture: NO GROWTH

## 2011-08-25 LAB — CULTURE, BLOOD (ROUTINE X 2)
Culture  Setup Time: 201212180145
Culture  Setup Time: 201212180145

## 2011-08-27 ENCOUNTER — Ambulatory Visit (HOSPITAL_COMMUNITY)
Admission: RE | Admit: 2011-08-27 | Discharge: 2011-08-27 | Disposition: A | Discharge status: Home / Self Care | Payer: Medicare Other | Source: Ambulatory Visit | Attending: Internal Medicine | Admitting: Internal Medicine

## 2011-08-27 ENCOUNTER — Other Ambulatory Visit (HOSPITAL_BASED_OUTPATIENT_CLINIC_OR_DEPARTMENT_OTHER): Payer: Self-pay | Admitting: Internal Medicine

## 2011-08-27 DIAGNOSIS — T148XXA Other injury of unspecified body region, initial encounter: Secondary | ICD-10-CM

## 2011-08-29 LAB — GLUCOSE, CAPILLARY
Glucose-Capillary: 122 mg/dL — ABNORMAL HIGH (ref 70–99)
Glucose-Capillary: 144 mg/dL — ABNORMAL HIGH (ref 70–99)

## 2011-08-30 LAB — GLUCOSE, CAPILLARY
Glucose-Capillary: 113 mg/dL — ABNORMAL HIGH (ref 70–99)
Glucose-Capillary: 133 mg/dL — ABNORMAL HIGH (ref 70–99)

## 2011-08-31 LAB — GLUCOSE, CAPILLARY
Glucose-Capillary: 163 mg/dL — ABNORMAL HIGH (ref 70–99)
Glucose-Capillary: 192 mg/dL — ABNORMAL HIGH (ref 70–99)

## 2011-09-01 LAB — GLUCOSE, CAPILLARY: Glucose-Capillary: 112 mg/dL — ABNORMAL HIGH (ref 70–99)

## 2011-09-10 LAB — GLUCOSE, CAPILLARY: Glucose-Capillary: 113 mg/dL — ABNORMAL HIGH (ref 70–99)

## 2011-09-12 LAB — GLUCOSE, CAPILLARY: Glucose-Capillary: 123 mg/dL — ABNORMAL HIGH (ref 70–99)

## 2011-09-13 LAB — GLUCOSE, CAPILLARY: Glucose-Capillary: 111 mg/dL — ABNORMAL HIGH (ref 70–99)

## 2011-09-15 ENCOUNTER — Ambulatory Visit (HOSPITAL_COMMUNITY)
Admit: 2011-09-15 | Discharge: 2011-09-15 | Disposition: A | Discharge status: Home / Self Care | Payer: Medicare Other | Attending: Internal Medicine | Admitting: Internal Medicine

## 2011-09-20 LAB — GLUCOSE, CAPILLARY: Glucose-Capillary: 97 mg/dL (ref 70–99)

## 2011-09-24 ENCOUNTER — Inpatient Hospital Stay (HOSPITAL_COMMUNITY)
Admission: EM | Admit: 2011-09-24 | Discharge: 2011-09-27 | DRG: 293 | Disposition: A | Discharge status: Skilled Nursing Facility | Payer: Medicare Other | Attending: Internal Medicine | Admitting: Internal Medicine

## 2011-09-24 ENCOUNTER — Encounter (HOSPITAL_COMMUNITY): Payer: Self-pay

## 2011-09-24 ENCOUNTER — Other Ambulatory Visit: Payer: Self-pay

## 2011-09-24 ENCOUNTER — Emergency Department (HOSPITAL_COMMUNITY): Payer: Medicare Other

## 2011-09-24 DIAGNOSIS — I5031 Acute diastolic (congestive) heart failure: Principal | ICD-10-CM | POA: Diagnosis present

## 2011-09-24 DIAGNOSIS — M1990 Unspecified osteoarthritis, unspecified site: Secondary | ICD-10-CM | POA: Diagnosis present

## 2011-09-24 DIAGNOSIS — Z7982 Long term (current) use of aspirin: Secondary | ICD-10-CM

## 2011-09-24 DIAGNOSIS — J61 Pneumoconiosis due to asbestos and other mineral fibers: Secondary | ICD-10-CM | POA: Diagnosis present

## 2011-09-24 DIAGNOSIS — K209 Esophagitis, unspecified without bleeding: Secondary | ICD-10-CM | POA: Diagnosis present

## 2011-09-24 DIAGNOSIS — R5381 Other malaise: Secondary | ICD-10-CM | POA: Diagnosis present

## 2011-09-24 DIAGNOSIS — Z7902 Long term (current) use of antithrombotics/antiplatelets: Secondary | ICD-10-CM

## 2011-09-24 DIAGNOSIS — D72829 Elevated white blood cell count, unspecified: Secondary | ICD-10-CM | POA: Diagnosis not present

## 2011-09-24 DIAGNOSIS — Z87891 Personal history of nicotine dependence: Secondary | ICD-10-CM

## 2011-09-24 DIAGNOSIS — G4733 Obstructive sleep apnea (adult) (pediatric): Secondary | ICD-10-CM | POA: Diagnosis present

## 2011-09-24 DIAGNOSIS — E876 Hypokalemia: Secondary | ICD-10-CM | POA: Diagnosis not present

## 2011-09-24 DIAGNOSIS — K222 Esophageal obstruction: Secondary | ICD-10-CM | POA: Diagnosis present

## 2011-09-24 DIAGNOSIS — E861 Hypovolemia: Secondary | ICD-10-CM | POA: Diagnosis not present

## 2011-09-24 DIAGNOSIS — I10 Essential (primary) hypertension: Secondary | ICD-10-CM | POA: Diagnosis present

## 2011-09-24 DIAGNOSIS — K2970 Gastritis, unspecified, without bleeding: Secondary | ICD-10-CM | POA: Diagnosis present

## 2011-09-24 DIAGNOSIS — F0390 Unspecified dementia without behavioral disturbance: Secondary | ICD-10-CM | POA: Diagnosis present

## 2011-09-24 DIAGNOSIS — E785 Hyperlipidemia, unspecified: Secondary | ICD-10-CM | POA: Diagnosis present

## 2011-09-24 DIAGNOSIS — Z952 Presence of prosthetic heart valve: Secondary | ICD-10-CM

## 2011-09-24 DIAGNOSIS — Z951 Presence of aortocoronary bypass graft: Secondary | ICD-10-CM

## 2011-09-24 DIAGNOSIS — I4891 Unspecified atrial fibrillation: Secondary | ICD-10-CM | POA: Diagnosis present

## 2011-09-24 DIAGNOSIS — I2510 Atherosclerotic heart disease of native coronary artery without angina pectoris: Secondary | ICD-10-CM | POA: Diagnosis present

## 2011-09-24 DIAGNOSIS — Z85038 Personal history of other malignant neoplasm of large intestine: Secondary | ICD-10-CM

## 2011-09-24 DIAGNOSIS — I509 Heart failure, unspecified: Secondary | ICD-10-CM

## 2011-09-24 DIAGNOSIS — Z79899 Other long term (current) drug therapy: Secondary | ICD-10-CM

## 2011-09-24 DIAGNOSIS — Z794 Long term (current) use of insulin: Secondary | ICD-10-CM

## 2011-09-24 DIAGNOSIS — I69991 Dysphagia following unspecified cerebrovascular disease: Secondary | ICD-10-CM

## 2011-09-24 DIAGNOSIS — R1312 Dysphagia, oropharyngeal phase: Secondary | ICD-10-CM | POA: Diagnosis present

## 2011-09-24 HISTORY — DX: Respiratory failure, unspecified, unspecified whether with hypoxia or hypercapnia: J96.90

## 2011-09-24 HISTORY — DX: Dehydration: E86.0

## 2011-09-24 HISTORY — DX: Unspecified dementia, unspecified severity, without behavioral disturbance, psychotic disturbance, mood disturbance, and anxiety: F03.90

## 2011-09-24 HISTORY — DX: Esophageal obstruction: K22.2

## 2011-09-24 HISTORY — DX: Thrombocytopenia, unspecified: D69.6

## 2011-09-24 HISTORY — DX: Heart failure, unspecified: I50.9

## 2011-09-24 HISTORY — DX: Elevated white blood cell count, unspecified: D72.829

## 2011-09-24 LAB — MRSA PCR SCREENING: MRSA by PCR: NEGATIVE

## 2011-09-24 LAB — CARDIAC PANEL(CRET KIN+CKTOT+MB+TROPI)
CK, MB: 2.4 ng/mL (ref 0.3–4.0)
Total CK: 57 U/L (ref 7–232)
Troponin I: <0.3 ng/mL (ref <0.30)

## 2011-09-24 LAB — CBC
HCT: 37.3 % — ABNORMAL LOW (ref 39.0–52.0)
Platelets: 251 10*3/uL (ref 150–400)
RDW: 15.2 % (ref 11.5–15.5)
WBC: 19.4 10*3/uL — ABNORMAL HIGH (ref 4.0–10.5)

## 2011-09-24 LAB — GLUCOSE, CAPILLARY
Glucose-Capillary: 137 mg/dL — ABNORMAL HIGH (ref 70–99)
Glucose-Capillary: 165 mg/dL — ABNORMAL HIGH (ref 70–99)

## 2011-09-24 LAB — DIFFERENTIAL
Basophils Absolute: 0 10*3/uL (ref 0.0–0.1)
Lymphocytes Relative: 10 % — ABNORMAL LOW (ref 12–46)
Monocytes Absolute: 1 10*3/uL (ref 0.1–1.0)
Neutro Abs: 16.3 10*3/uL — ABNORMAL HIGH (ref 1.7–7.7)
WBC Morphology: INCREASED

## 2011-09-24 LAB — URINALYSIS, ROUTINE W REFLEX MICROSCOPIC
Leukocytes, UA: NEGATIVE
Nitrite: NEGATIVE
Specific Gravity, Urine: 1.03 (ref 1.005–1.030)
pH: 5.5 (ref 5.0–8.0)

## 2011-09-24 LAB — BASIC METABOLIC PANEL
Chloride: 101 mEq/L (ref 96–112)
GFR calc Af Amer: 90 mL/min — ABNORMAL LOW (ref >90)
Potassium: 4.3 mEq/L (ref 3.5–5.1)
Sodium: 135 mEq/L (ref 135–145)

## 2011-09-24 LAB — URINE CULTURE: Culture  Setup Time: 201301231406

## 2011-09-24 MED ORDER — ARFORMOTEROL TARTRATE 15 MCG/2ML IN NEBU
15.0000 ug | INHALATION_SOLUTION | Freq: Two times a day (BID) | RESPIRATORY_TRACT | Status: DC
  Administered 2011-09-26 – 2011-09-27 (×3): 15 ug via RESPIRATORY_TRACT
  Filled 2011-09-24 (×10): qty 2

## 2011-09-24 MED ORDER — DONEPEZIL HCL 5 MG PO TABS
5.0000 mg | ORAL_TABLET | Freq: Every day | ORAL | Status: DC
  Administered 2011-09-24 – 2011-09-27 (×3): 5 mg via ORAL
  Filled 2011-09-24 (×3): qty 1

## 2011-09-24 MED ORDER — ENSURE CLINICAL ST REVIGOR PO LIQD
237.0000 mL | Freq: Two times a day (BID) | ORAL | Status: DC
  Administered 2011-09-24: 237 mL via ORAL

## 2011-09-24 MED ORDER — ENOXAPARIN SODIUM 40 MG/0.4ML ~~LOC~~ SOLN
40.0000 mg | SUBCUTANEOUS | Status: DC
  Administered 2011-09-24: 40 mg via SUBCUTANEOUS
  Filled 2011-09-24: qty 0.4

## 2011-09-24 MED ORDER — MAGNESIUM HYDROXIDE 400 MG/5ML PO SUSP: 30.0000 mL | Freq: Every day | ORAL | Status: DC | PRN

## 2011-09-24 MED ORDER — FUROSEMIDE 10 MG/ML IJ SOLN
40.0000 mg | Freq: Once | INTRAMUSCULAR | Status: AC
  Administered 2011-09-24: 40 mg via INTRAVENOUS
  Filled 2011-09-24: qty 4

## 2011-09-24 MED ORDER — VANCOMYCIN HCL IN DEXTROSE 1-5 GM/200ML-% IV SOLN
1000.0000 mg | Freq: Two times a day (BID) | INTRAVENOUS | Status: DC
  Administered 2011-09-25 – 2011-09-26 (×4): 1000 mg via INTRAVENOUS
  Filled 2011-09-24 (×10): qty 200

## 2011-09-24 MED ORDER — SODIUM CHLORIDE 0.9 % IJ SOLN: 3.0000 mL | INTRAMUSCULAR | Status: DC | PRN

## 2011-09-24 MED ORDER — ALBUTEROL SULFATE (5 MG/ML) 0.5% IN NEBU
2.5000 mg | INHALATION_SOLUTION | Freq: Four times a day (QID) | RESPIRATORY_TRACT | Status: DC
  Administered 2011-09-24 – 2011-09-27 (×11): 2.5 mg via RESPIRATORY_TRACT
  Filled 2011-09-24 (×11): qty 0.5

## 2011-09-24 MED ORDER — VANCOMYCIN HCL IN DEXTROSE 1-5 GM/200ML-% IV SOLN
1000.0000 mg | Freq: Once | INTRAVENOUS | Status: AC
  Administered 2011-09-24: 1000 mg via INTRAVENOUS
  Filled 2011-09-24: qty 200

## 2011-09-24 MED ORDER — ACETAMINOPHEN 500 MG PO TABS
500.0000 mg | ORAL_TABLET | Freq: Four times a day (QID) | ORAL | Status: DC | PRN
  Administered 2011-09-25 – 2011-09-27 (×3): 500 mg via ORAL
  Filled 2011-09-24 (×3): qty 1

## 2011-09-24 MED ORDER — ACETAMINOPHEN 325 MG PO TABS
650.0000 mg | ORAL_TABLET | Freq: Once | ORAL | Status: AC
  Administered 2011-09-24: 650 mg via ORAL
  Filled 2011-09-24: qty 2

## 2011-09-24 MED ORDER — SODIUM CHLORIDE 0.9 % IJ SOLN
3.0000 mL | Freq: Two times a day (BID) | INTRAMUSCULAR | Status: DC
  Administered 2011-09-24 – 2011-09-27 (×4): 3 mL via INTRAVENOUS
  Filled 2011-09-24 (×5): qty 3

## 2011-09-24 MED ORDER — ALUM & MAG HYDROXIDE-SIMETH 200-200-20 MG/5ML PO SUSP
30.0000 mL | Freq: Four times a day (QID) | ORAL | Status: DC | PRN
Start: 2011-09-24 — End: 2011-09-27

## 2011-09-24 MED ORDER — ONDANSETRON HCL 4 MG/2ML IJ SOLN: 4.0000 mg | Freq: Four times a day (QID) | INTRAMUSCULAR | Status: DC | PRN

## 2011-09-24 MED ORDER — ARFORMOTEROL TARTRATE 15 MCG/2ML IN NEBU
15.0000 ug | INHALATION_SOLUTION | Freq: Two times a day (BID) | RESPIRATORY_TRACT | Status: DC
  Filled 2011-09-24 (×4): qty 2

## 2011-09-24 MED ORDER — GUAIFENESIN-DM 100-10 MG/5ML PO SYRP
5.0000 mL | ORAL_SOLUTION | ORAL | Status: DC | PRN
Start: 2011-09-24 — End: 2011-09-27

## 2011-09-24 MED ORDER — SODIUM CHLORIDE 0.9 % IV SOLN
INTRAVENOUS | Status: DC
  Administered 2011-09-24: 10:00:00 via INTRAVENOUS

## 2011-09-24 MED ORDER — SIMVASTATIN 20 MG PO TABS
20.0000 mg | ORAL_TABLET | Freq: Every day | ORAL | Status: DC
  Administered 2011-09-24 – 2011-09-26 (×3): 20 mg via ORAL
  Filled 2011-09-24 (×3): qty 1

## 2011-09-24 MED ORDER — ONDANSETRON HCL 4 MG PO TABS: 4.0000 mg | ORAL_TABLET | Freq: Four times a day (QID) | ORAL | Status: DC | PRN

## 2011-09-24 MED ORDER — PANTOPRAZOLE SODIUM 40 MG PO TBEC
40.0000 mg | DELAYED_RELEASE_TABLET | Freq: Every day | ORAL | Status: DC
  Administered 2011-09-24 – 2011-09-25 (×2): 40 mg via ORAL
  Filled 2011-09-24 (×2): qty 1

## 2011-09-24 MED ORDER — ACETAMINOPHEN 500 MG PO TABS
1000.0000 mg | ORAL_TABLET | Freq: Two times a day (BID) | ORAL | Status: DC
  Administered 2011-09-24: 1000 mg via ORAL
  Filled 2011-09-24 (×2): qty 2

## 2011-09-24 MED ORDER — BUDESONIDE 0.25 MG/2ML IN SUSP
0.2500 mg | Freq: Two times a day (BID) | RESPIRATORY_TRACT | Status: DC
  Administered 2011-09-24 – 2011-09-27 (×6): 0.25 mg via RESPIRATORY_TRACT
  Filled 2011-09-24 (×10): qty 2

## 2011-09-24 MED ORDER — SODIUM CHLORIDE 0.9 % IV SOLN: 250.0000 mL | INTRAVENOUS | Status: DC | PRN

## 2011-09-24 MED ORDER — TIOTROPIUM BROMIDE MONOHYDRATE 18 MCG IN CAPS
18.0000 ug | ORAL_CAPSULE | Freq: Every day | RESPIRATORY_TRACT | Status: DC
  Administered 2011-09-25 – 2011-09-27 (×3): 18 ug via RESPIRATORY_TRACT
  Filled 2011-09-24: qty 5

## 2011-09-24 MED ORDER — CLOPIDOGREL BISULFATE 75 MG PO TABS
75.0000 mg | ORAL_TABLET | Freq: Every day | ORAL | Status: DC
  Administered 2011-09-25 – 2011-09-27 (×2): 75 mg via ORAL
  Filled 2011-09-24 (×3): qty 1

## 2011-09-24 MED ORDER — SODIUM CHLORIDE 0.9 % IV SOLN
500.0000 mg | Freq: Three times a day (TID) | INTRAVENOUS | Status: DC
  Administered 2011-09-24 – 2011-09-27 (×9): 500 mg via INTRAVENOUS
  Filled 2011-09-24 (×15): qty 500

## 2011-09-24 MED ORDER — ASPIRIN EC 81 MG PO TBEC
81.0000 mg | DELAYED_RELEASE_TABLET | Freq: Every day | ORAL | Status: DC
  Administered 2011-09-24 – 2011-09-27 (×3): 81 mg via ORAL
  Filled 2011-09-24 (×3): qty 1

## 2011-09-24 MED ORDER — FUROSEMIDE 10 MG/ML IJ SOLN
40.0000 mg | Freq: Two times a day (BID) | INTRAMUSCULAR | Status: DC
  Administered 2011-09-24 – 2011-09-26 (×4): 40 mg via INTRAVENOUS
  Filled 2011-09-24 (×4): qty 4

## 2011-09-24 NOTE — ED Provider Notes (Cosign Needed)
This chart was scribed for Christiane Ha, MD by Williemae Natter. The patient was seen in room A301/A301-01 at 9:47 AM.  CSN: [id number]  Arrival date & time 09/24/11  0845   First MD Initiated Contact with Patient 09/24/11 [phone]      Chief Complaint  Patient presents with  . URI    (Consider location/radiation/quality/duration/timing/severity/associated sxs/prior treatment) HPI Level 5 caveat due to dementia [name] is a 78 y.o. male with a history who presents to the Emergency Department complaining of an upper respiratory infection. Pt had pneumonia last week and was treated by Dr. Leanord Hawking from [facility]. Pt's nurse reported him having a restless night and noted that his congestion got progressively worse.   Past Medical History  Diagnosis Date  . Encounter for long-term (current) use of anticoagulants 12/09/2010  . Hypoxemia 05/15/2009  . Atrial fibrillation 01/19/2009  . CORONARY ARTERY DISEASE 10/07/2007  . AORTIC STENOSIS, SEVERE 10/07/2007  . PVD 10/07/2007  . HYPERLIPIDEMIA 10/07/2007  . HYPERTENSION 10/07/2007  . CEREBROVASCULAR DISEASE 10/07/2007  . DYSPNEA 02/14/2008  . OBSTRUCTIVE SLEEP APNEA 10/07/2007  . Asbestosis 02/21/2008  . History of colon cancer   . DDD (degenerative disc disease)     low back  . Colon polyp   . Pneumonia   . Respiratory failure   . Leukocytosis   . Thrombocytopenia   . Dehydration   . Dementia   . CHF (congestive heart failure)     Past Surgical History  Procedure Date  . Abdominal aortic aneurysm repair 1992  . Coronary artery bypass graft 2005  . Aortic valve replacement 2005  . Knee surgery     left  . Eye surgery     Family History  Problem Relation Age of Onset  . COPD    . Colon cancer    . Lung cancer    . Heart disease    . Coronary artery disease    . Stroke Mother 50  . Heart disease Father     History  Substance Use Topics  . Smoking status: Former Smoker -- 1.0 packs/day for 20 years    Types: Cigarettes    Quit date: 12/16/1973  . Smokeless tobacco: Never Used  . Alcohol Use: No      Review of Systems  Unable to perform ROS: Dementia    Allergies  Morphine and Ultram  Home Medications   No current outpatient prescriptions on file.Pulse oximetry on room air is 94% Adequate by my interpretation. .   BP 112/54  Pulse 95  Temp(Src) 98.7 F (37.1 C) (Oral)  Resp 20  Ht 5\' 9"  (1.753 m)  Wt 164 lb 3.9 oz (74.5 kg)  BMI 24.25 kg/m2  SpO2 93%  Physical Exam  Nursing note and vitals reviewed. Constitutional: He is oriented to person, place, and time. He appears well-developed and well-nourished.  HENT:  Head: Normocephalic and atraumatic.  Eyes: Conjunctivae and EOM are normal. Pupils are equal, round, and reactive to light.  Neck: Normal range of motion.  Cardiovascular: Normal rate and normal heart sounds.   Pulmonary/Chest: Effort normal. He has rhonchi (bilaterally). He has rales (right side).  Abdominal: Soft. There is no tenderness.  Musculoskeletal: Normal range of motion. He exhibits no edema.  Neurological: He is alert and oriented to person, place, and time.  Skin: Skin is warm and dry.    ED Course  Procedures (including critical care time)  Medications  tiotropium (SPIRIVA HANDIHALER) 18 MCG inhalation capsule (not  administered)  celecoxib (CELEBREX) 100 MG capsule (not administered)  levofloxacin (LEVAQUIN) 500 MG tablet (not administered)  metroNIDAZOLE (FLAGYL) 500 MG tablet (not administered)  acetaminophen (TYLENOL) 500 MG tablet (not administered)  albuterol (PROVENTIL) (2.5 MG/3ML) 0.083% nebulizer solution (not administered)  acetaminophen (TYLENOL) 500 MG tablet (not administered)  acetaminophen (TYLENOL) tablet 1,000 mg (1000 mg Oral Not Given 09/24/11 1445)  acetaminophen (TYLENOL) tablet 500 mg (not administered)  albuterol (PROVENTIL) (5 MG/ML) 0.5% nebulizer solution 2.5 mg (not administered)  tiotropium (SPIRIVA) inhalation capsule 18 mcg (not  administered)  aspirin EC tablet 81 mg (81 mg Oral Given 09/24/11 1522)  clopidogrel (PLAVIX) tablet 75 mg (not administered)  donepezil (ARICEPT) tablet 5 mg (5 mg Oral Given 09/24/11 1522)  feeding supplement (ENSURE CLINICAL STRENGTH) liquid 237 mL (237 mL Oral Given 09/24/11 1500)  pantoprazole (PROTONIX) EC tablet 40 mg (40 mg Oral Given 09/24/11 1522)  arformoterol (BROVANA) nebulizer solution 15 mcg (not administered)  budesonide (PULMICORT) nebulizer solution 0.25 mg (not administered)  simvastatin (ZOCOR) tablet 20 mg (not administered)  enoxaparin (LOVENOX) injection 40 mg (40 mg Subcutaneous Given 09/24/11 1523)  sodium chloride 0.9 % injection 3 mL (3 mL Intravenous Given 09/24/11 1523)  sodium chloride 0.9 % injection 3 mL (not administered)  0.9 %  sodium chloride infusion (not administered)  ondansetron (ZOFRAN) tablet 4 mg (not administered)    Or  ondansetron (ZOFRAN) injection 4 mg (not administered)  magnesium hydroxide (MILK OF MAGNESIA) suspension 30 mL (not administered)  alum & mag hydroxide-simeth (MAALOX/MYLANTA) 200-200-20 MG/5ML suspension 30 mL (not administered)  guaiFENesin-dextromethorphan (ROBITUSSIN DM) 100-10 MG/5ML syrup 5 mL (not administered)  furosemide (LASIX) injection 40 mg (not administered)  imipenem-cilastatin (PRIMAXIN) 500 mg in sodium chloride 0.9 % 100 mL IVPB (not administered)  vancomycin (VANCOCIN) IVPB 1000 mg/200 mL premix (not administered)  vancomycin (VANCOCIN) IVPB 1000 mg/200 mL premix (not administered)  acetaminophen (TYLENOL) tablet 650 mg (650 mg Oral Given 09/24/11 1141)  furosemide (LASIX) injection 40 mg (40 mg Intravenous Given 09/24/11 1246)    Labs Reviewed  CBC - Abnormal; Notable for the following:    WBC 19.4 (*)    RBC 4.09 (*)    Hemoglobin 12.6 (*)    HCT 37.3 (*)    All other components within normal limits  DIFFERENTIAL - Abnormal; Notable for the following:    Neutrophils Relative 84 (*)    Neutro Abs 16.3 (*)      Lymphocytes Relative 10 (*)    All other components within normal limits  BASIC METABOLIC PANEL - Abnormal; Notable for the following:    Glucose, Bld 180 (*)    GFR calc non Af Amer 77 (*)    GFR calc Af Amer 90 (*)    All other components within normal limits  URINALYSIS, ROUTINE W REFLEX MICROSCOPIC - Abnormal; Notable for the following:    Ketones, ur TRACE (*)    All other components within normal limits  PRO B NATRIURETIC PEPTIDE - Abnormal; Notable for the following:    Pro B Natriuretic peptide (BNP) 2908.0 (*)    All other components within normal limits  CARDIAC PANEL(CRET KIN+CKTOT+MB+TROPI)  PROCALCITONIN  URINE CULTURE  TSH  CULTURE, BLOOD (ROUTINE X 2)  CULTURE, BLOOD (ROUTINE X 2)  POCT CBG MONITORING   Dg Chest Portable 1 View  09/24/2011  *RADIOLOGY REPORT*  Clinical Data: Cough and chest congestion.  PORTABLE CHEST - 1 VIEW  Comparison: None.  Findings: The patient has developed mild bilateral pulmonary  edema. There is chronic cardiomegaly.  Pulmonary vascularity is slightly more prominent than on the prior study.  Chronic pleural thickening and calcification bilaterally.  IMPRESSION: Interval development of mild bilateral pulmonary edema.  Original Report Authenticated By: Gwynn Burly, M.D.    Component     Latest Ref Rng 11/23/2007 08/17/2011 09/24/2011  Pro B Natriuretic peptide (BNP)     0 - 450 pg/mL 166.0 (H) 887.6 (H) 2908.0 (H)    Date: 09/24/2011  Rate: 92  Rhythm: atrial fibrillation  QRS Axis: normal  Intervals: QT normal  ST/T Wave abnormalities: normal  Conduction Disutrbances:none  Narrative Interpretation:   Old EKG Reviewed: none available  1. Congestive heart failure       MDM  Cough and congestion with CHF on x-ray and elevated from baseline. Doubt pneumonia, currently. Patient is not thriving in the current setting, so we'll admit for diuresis and further management. Doubt ACS, occult infection, metabolic instability   I  personally performed the services described in this documentation, which was scribed in my presence. The recorded information has been reviewed and considered.        Flint Melter, MD 09/24/11 [phone]

## 2011-09-24 NOTE — H&P (Signed)
Hospital Admission Note Date: 09/24/2011  Patient name: [name] Medical record number: [id number] Date of birth: [birth year] Age: 78 y.o. Gender: male PCP: Terald Sleeper, MD, MD  Attending physician: Christiane Ha, MD  Chief Complaint: congestion  History of Present Illness:  [name] is an 78 y.o. male who was sent to the emergency room from the [facility]. He is demented and unable to provide any history. All history is per family members and chart review. He reportedly had a lot of congestion worsening over the past 24 hours. He had previously been on Lasix, but the dose was changed or possibly this was discontinued. He also was started on Celebrex recently. He was found to have a chest x-ray consistent with new pulmonary edema. He's coughing up frothy white sputum currently. Large amounts. His daughter reports that it was a bit pink tinged previously. He had a low-grade fever in the emergency room. He also has a leukocytosis and bandemia. He just completed a seven-day course of levofloxacin at the skilled nursing facility. He has a history of recent respiratory failure related to asbestosis, viral infection, sleep apnea. He has a history of atrial fibrillation. There is a mention of CHF on previous neurology H&P, but I see no mention of CHF on any of his outpatient cardiac meds. He had previously been followed by Dr. Juanito Doom, but family no longer wants him involved in the care. Patient has been in and out of the hospital and various nursing homes over the past several months. Apparently he continues to decline physically, mentally, and clinically, but patient's wife is hopeful that he will someday make it back home. Patient has received IV fluids and Lasix in the emergency room. His pro BNP is 2908, up from 800 a month ago. Echocardiogram the rhythm during a hospitalization in October for acute stroke shows normal ejection fraction, bioprosthetic valve with a valve area of 1.37  cm, mild dilated right ventricle moderately dilated atria bilaterally and he can pulmonary artery pressure of 49 mm mercury. Mild LVH.  Past Medical History  Diagnosis Date      . Hypoxemia 05/15/2009  . Atrial fibrillation 01/19/2009  . CORONARY ARTERY DISEASE 10/07/2007  . AORTIC STENOSIS, SEVERE 10/07/2007  . PVD 10/07/2007  . HYPERLIPIDEMIA 10/07/2007  . HYPERTENSION 10/07/2007  . CEREBROVASCULAR DISEASE 10/07/2007      . OBSTRUCTIVE SLEEP APNEA 10/07/2007  . Asbestosis 02/21/2008  . History of colon cancer   . DDD (degenerative disc disease)     low back  . Colon polyp   . Pneumonia   . Respiratory failure   . Leukocytosis   . Thrombocytopenia       . Dementia    status post bioprosthetic aortic valve replacement   Meds:  Medications Prior to Admission  Medication Sig Dispense Refill  . arformoterol (BROVANA) 15 MCG/2ML NEBU Take 15 mcg by nebulization 2 (two) times daily.       Marland Kitchen aspirin 81 MG EC tablet Take 81 mg by mouth daily.        . budesonide (PULMICORT) 0.25 MG/2ML nebulizer solution Take 0.25 mg by nebulization 2 (two) times daily.        . clopidogrel (PLAVIX) 75 MG tablet Take 75 mg by mouth daily with breakfast.        . donepezil (ARICEPT) 5 MG tablet Take 5 mg by mouth daily.        . feeding supplement (ENSURE CLINICAL STRENGTH) LIQD Take 237 mLs  by mouth 2 (two) times daily between meals.      Marland Kitchen guaiFENesin (MUCINEX) 600 MG 12 hr tablet Take 1 tablet (600 mg total) by mouth 2 (two) times daily as needed.      . lansoprazole (PREVACID) 30 MG capsule Take 30 mg by mouth daily.        . Multiple Vitamin (MULTIVITAMIN) tablet Take 1 tablet by mouth daily.       . pravastatin (PRAVACHOL) 40 MG tablet Take 40 mg by mouth at bedtime.        Allergies: Morphine and Ultram History   Social History  . Marital Status: Married    Spouse Name: N/A    Number of Children: N/A  . Years of Education: N/A   Occupational History  . Not on file.   Social History Main Topics    . Smoking status: Former Smoker -- 1.0 packs/day for 20 years    Types: Cigarettes    Quit date: 12/16/1973  . Smokeless tobacco: Never Used  . Alcohol Use: No  . Drug Use: No  . Sexually Active: Not on file   Other Topics Concern  . Not on file   Social History Narrative  . No narrative on file   Family History  Problem Relation Age of Onset  . COPD    . Colon cancer    . Lung cancer    . Heart disease    . Coronary artery disease    . Stroke Mother 9  . Heart disease Father    Past Surgical History  Procedure Date  . Abdominal aortic aneurysm repair 1992  . Coronary artery bypass graft 2005  . Aortic valve replacement 2005  . Knee surgery     left  . Eye surgery     Review of Systems: Unable due to poor historian  Physical Exam: Blood pressure 112/62, pulse 100, temperature 98.8 F (37.1 C), temperature source Oral, resp. rate 20, SpO2 96.00%. BP 112/62  Pulse 100  Temp(Src) 98.8 F (37.1 C) (Oral)  Resp 20  SpO2 96%  General Appearance:    Alert, cooperative, coughing up large amounts of frothy white sputum, uncomfortable appearing   Head:    Normocephalic, without obvious abnormality, atraumatic  Eyes:    PERRL, conjunctiva/corneas clear, EOM's intact, fundi    benign, both eyes  Ears:    Normal TM's and external ear canals, both ears  Nose:   Nares normal, septum midline, mucosa normal, no drainage    or sinus tenderness  Throat:   Lips, mucosa, and tongue normal; teeth and gums normal  Neck:   Supple, symmetrical, trachea midline, no adenopathy;    thyroid:  no enlargement/tenderness/nodules; JVD is present   Back:     Symmetric, no curvature, ROM normal, no CVA tenderness  Lungs:     diminished throughout, uncooperative with exam. No wheezing or rhonchi anteriorly.   Chest Wall:    No tenderness or deformity   Heart:    Regular rate and rhythm, S1 and S2 normal, no murmur, rub   or gallop     Abdomen:     Soft, non-tender, bowel sounds active all  four quadrants,    no masses, no organomegaly  Genitalia:   deferred   Rectal:   deferred   Extremities:   Extremities normal, atraumatic, no cyanosis or edema  Pulses:   2+ and symmetric all extremities  Skin:   Skin color, texture, turgor normal, no rashes or lesions  Lymph nodes:   Cervical, supraclavicular, and axillary nodes normal  Neurologic:   CNII-XII intact, normal strength, sensation and reflexes    throughout    Psychiatric: Distracted. Somewhat cooperative.  Lab results: Basic Metabolic Panel:  Basename 09/24/11 0932  NA 135  K 4.3  CL 101  CO2 25  GLUCOSE 180*  BUN 19  CREATININE 0.82  CALCIUM 9.4  MG --  PHOS --   Liver Function Tests: No results found for this basename: AST:2,ALT:2,ALKPHOS:2,BILITOT:2,PROT:2,ALBUMIN:2 in the last 72 hours No results found for this basename: LIPASE:2,AMYLASE:2 in the last 72 hours No results found for this basename: AMMONIA:2 in the last 72 hours CBC:  Basename 09/24/11 0932  WBC 19.4*  NEUTROABS 16.3*  HGB 12.6*  HCT 37.3*  MCV 91.2  PLT 251   Cardiac Enzymes: No results found for this basename: CKTOTAL:3,CKMB:3,CKMBINDEX:3,TROPONINI:3 in the last 72 hours BNP:  Basename 09/24/11 0932  PROBNP 2908.0*   D-Dimer: No results found for this basename: DDIMER:2 in the last 72 hours CBG:  Basename 09/24/11 0602 09/22/11 0540  GLUCAP 137* 100*   Hemoglobin A1C: No results found for this basename: HGBA1C in the last 72 hours Fasting Lipid Panel: No results found for this basename: CHOL,HDL,LDLCALC,TRIG,CHOLHDL,LDLDIRECT in the last 72 hours Thyroid Function Tests: No results found for this basename: TSH,T4TOTAL,FREET4,T3FREE,THYROIDAB in the last 72 hours Anemia Panel: No results found for this basename: VITAMINB12,FOLATE,FERRITIN,TIBC,IRON,RETICCTPCT in the last 72 hours Coagulation: No results found for this basename: LABPROT:2,INR:2 in the last 72 hours Urine Drug Screen: Drugs of Abuse  No results found  for this basename: labopia, cocainscrnur, labbenz, amphetmu, thcu, labbarb    Alcohol Level: No results found for this basename: ETH:2 in the last 72 hours Urinalysis: YELLOW APPearance CLEAR Specific Gravity, Urine 1.030 pH 5.5 Glucose, UA NEGATIVE Bilirubin Urine NEGATIVE Ketones, ur TRACE Protein, ur NEGATIVE Urobilinogen, UA 0.2 Nitrite NEGATIVE Leukocytes, UA NEGATIVE   Imaging results:  Dg Chest Portable 1 View  09/24/2011  *RADIOLOGY REPORT*  Clinical Data: Cough and chest congestion.  PORTABLE CHEST - 1 VIEW  Comparison: None.  Findings: The patient has developed mild bilateral pulmonary edema. There is chronic cardiomegaly.  Pulmonary vascularity is slightly more prominent than on the prior study.  Chronic pleural thickening and calcification bilaterally.  IMPRESSION: Interval development of mild bilateral pulmonary edema.  Original Report Authenticated By: Gwynn Burly, M.D.    Assessment & Plan: Principal Problem:  *Acute diastolic congestive heart failure Active Problems:  HYPERTENSION  Atrial fibrillation  Leukocytosis  OBSTRUCTIVE SLEEP APNEA  Asbestosis  CORONARY ARTERY BYPASS GRAFT, HX OF  Dementia  OA (osteoarthritis)  Debility  No need for repeat echocardiogram at this time. He just had one a few months ago. It showed normal left ventricular ejection fraction. Mild to moderate pulmonary hypertension, and the aortic valve area was not too bad. He had previously been on daily Lasix which will need to be resumed. For now IV Lasix twice daily. He was just started on Celebrex which may be contributing as well. No obvious pneumonia on chest x-ray, but patient has a low-grade fever and leukocytosis and bandemia. He had previously had leukocytosis felt to be related to the prednisone. It appears however that she is off prednisone currently. I will cover with vancomycin and Primaxin for now after getting blood cultures. Repeat chest x-ray soon once less volume overloaded to  look for underlying infiltrate. Patient is not a Coumadin candidate due to too frequent falls. Continue CPAP at night, though  he reportedly is occasionally noncompliant. I spoke with family about CODE STATUS. The patient's wife requests full code and appears overly hopeful. Patient has been institutionalized either in hospital or skilled nursing facility nearly continuously for the past several months and continues to decline. His long-term prognosis appears guarded. The patient's daughter voiced understanding but the patient's wife is not entirely realistic. Will treat aggressively and keep full code. Georganna Maxson L 09/24/2011, 1:56 PM

## 2011-09-24 NOTE — Progress Notes (Signed)
Family told dayshift therapist mike mcdaniel pt did not wear his cpap at the nursing home he pulls  It off

## 2011-09-24 NOTE — ED Notes (Signed)
Staff reports pt has had cough, congestion, fever x 4 weeks.  Reports symptoms got worse today.  Has been on Levaquin, routine and prn nebs but not helping.

## 2011-09-24 NOTE — Consult Note (Signed)
ANTIBIOTIC CONSULT NOTE - INITIAL  Pharmacy Consult for Vancomycin Indication: fever, bandemia  Allergies  Allergen Reactions  . Morphine Nausea And Vomiting  . Ultram (Tramadol Hcl) Other (See Comments)    Made "wild"   Patient Measurements: Height: 5\' 9"  (175.3 cm) Weight: 164 lb 3.9 oz (74.5 kg) IBW/kg (Calculated) : 70.7   Vital Signs: Temp: 98.7 F (37.1 C) (01/23 1438) Temp src: Oral (01/23 1438) BP: 112/54 mmHg (01/23 1438) Pulse Rate: 95  (01/23 1438) Intake/Output from previous day:   Intake/Output from this shift:    Labs:  Basename 09/24/11 0932  WBC 19.4*  HGB 12.6*  PLT 251  LABCREA --  CREATININE 0.82   Estimated Creatinine Clearance: 63.5 ml/min (by C-G formula based on Cr of 0.82). No results found for this basename: VANCOTROUGH:2,VANCOPEAK:2,VANCORANDOM:2,GENTTROUGH:2,GENTPEAK:2,GENTRANDOM:2,TOBRATROUGH:2,TOBRAPEAK:2,TOBRARND:2,AMIKACINPEAK:2,AMIKACINTROU:2,AMIKACIN:2, in the last 72 hours   Microbiology: No results found for this or any previous visit (from the past 720 hour(s)).  Medical History: Past Medical History  Diagnosis Date  . Encounter for long-term (current) use of anticoagulants 12/09/2010  . Hypoxemia 05/15/2009  . Atrial fibrillation 01/19/2009  . CORONARY ARTERY DISEASE 10/07/2007  . AORTIC STENOSIS, SEVERE 10/07/2007  . PVD 10/07/2007  . HYPERLIPIDEMIA 10/07/2007  . HYPERTENSION 10/07/2007  . CEREBROVASCULAR DISEASE 10/07/2007  . DYSPNEA 02/14/2008  . OBSTRUCTIVE SLEEP APNEA 10/07/2007  . Asbestosis 02/21/2008  . History of colon cancer   . DDD (degenerative disc disease)     low back  . Colon polyp   . Pneumonia   . Respiratory failure   . Leukocytosis   . Thrombocytopenia   . Dehydration   . Dementia   . CHF (congestive heart failure)    Medications:  Scheduled:    . acetaminophen  1,000 mg Oral BID  . acetaminophen  650 mg Oral Once  . albuterol  2.5 mg Nebulization Q6H  . arformoterol  15 mcg Nebulization BID  . aspirin EC   81 mg Oral Daily  . budesonide  0.25 mg Nebulization BID  . clopidogrel  75 mg Oral Q breakfast  . donepezil  5 mg Oral Daily  . enoxaparin  40 mg Subcutaneous Q24H  . feeding supplement  237 mL Oral BID BM  . furosemide  40 mg Intravenous Once  . furosemide  40 mg Intravenous BID  . imipenem-cilastatin  500 mg Intravenous Q8H  . pantoprazole  40 mg Oral Daily  . simvastatin  20 mg Oral q1800  . sodium chloride  3 mL Intravenous Q12H  . tiotropium  18 mcg Inhalation Daily  . vancomycin  1,000 mg Intravenous Once  . vancomycin  1,000 mg Intravenous Q12H   Assessment: Good renal fxn  Goal of Therapy:  Vancomycin trough level 15-20 mcg/ml  Plan: Vancomycin 1gm iv q12hrs Check trough at steady state Labs per protocol  [name] A 09/24/2011,2:56 PM

## 2011-09-24 NOTE — ED Notes (Signed)
Protective barrier cream applied to buttocks and pt positioned to side.

## 2011-09-24 NOTE — ED Notes (Signed)
Family at bedside. Patient and family are just waiting to be admitted upstairs.

## 2011-09-25 ENCOUNTER — Encounter (HOSPITAL_COMMUNITY): Payer: Self-pay | Admitting: Gastroenterology

## 2011-09-25 ENCOUNTER — Ambulatory Visit: Payer: Medicare Other | Admitting: Family Medicine

## 2011-09-25 ENCOUNTER — Inpatient Hospital Stay (HOSPITAL_COMMUNITY): Payer: Medicare Other

## 2011-09-25 DIAGNOSIS — R1310 Dysphagia, unspecified: Secondary | ICD-10-CM

## 2011-09-25 DIAGNOSIS — Z8719 Personal history of other diseases of the digestive system: Secondary | ICD-10-CM

## 2011-09-25 LAB — DIFFERENTIAL
Basophils Absolute: 0 10*3/uL (ref 0.0–0.1)
Basophils Relative: 0 % (ref 0–1)
Eosinophils Relative: 2 % (ref 0–5)
Monocytes Absolute: 0.8 10*3/uL (ref 0.1–1.0)
Neutro Abs: 10 10*3/uL — ABNORMAL HIGH (ref 1.7–7.7)

## 2011-09-25 LAB — GLUCOSE, CAPILLARY
Glucose-Capillary: 104 mg/dL — ABNORMAL HIGH (ref 70–99)
Glucose-Capillary: 143 mg/dL — ABNORMAL HIGH (ref 70–99)
Glucose-Capillary: 134 mg/dL — ABNORMAL HIGH (ref 70–99)

## 2011-09-25 LAB — CBC
HCT: 34 % — ABNORMAL LOW (ref 39.0–52.0)
MCHC: 33.2 g/dL (ref 30.0–36.0)
Platelets: 236 10*3/uL (ref 150–400)
RDW: 15.5 % (ref 11.5–15.5)

## 2011-09-25 NOTE — Consult Note (Signed)
ANTIBIOTIC CONSULT NOTE   Pharmacy Consult for Vancomycin Indication: fever, bandemia  Allergies  Allergen Reactions  . Morphine Nausea And Vomiting  . Ultram (Tramadol Hcl) Other (See Comments)    Made "wild"   Patient Measurements: Height: 5\' 9"  (175.3 cm) Weight: 164 lb 3.9 oz (74.5 kg) IBW/kg (Calculated) : 70.7   Vital Signs: Temp: 97.4 F (36.3 C) (01/24 0616) Temp src: Oral (01/24 0616) BP: 110/66 mmHg (01/24 0616) Pulse Rate: 94  (01/24 0616) Intake/Output from previous day: 01/23 0701 - 01/24 0700 In: 20 [P.O.:20] Out: 200 [Urine:200] Intake/Output from this shift:    Labs:  Basename 09/25/11 0510 09/24/11 0932  WBC 13.9* 19.4*  HGB 11.3* 12.6*  PLT 236 251  LABCREA -- --  CREATININE -- 0.82   Estimated Creatinine Clearance: 63.5 ml/min (by C-G formula based on Cr of 0.82). No results found for this basename: VANCOTROUGH:2,VANCOPEAK:2,VANCORANDOM:2,GENTTROUGH:2,GENTPEAK:2,GENTRANDOM:2,TOBRATROUGH:2,TOBRAPEAK:2,TOBRARND:2,AMIKACINPEAK:2,AMIKACINTROU:2,AMIKACIN:2, in the last 72 hours   Microbiology: Recent Results (from the past 720 hour(s))  MRSA PCR SCREENING     Status: Normal   Collection Time   09/24/11  4:54 PM      Component Value Range Status Comment   MRSA by PCR NEGATIVE  NEGATIVE  Final    Medical History: Past Medical History  Diagnosis Date  . Encounter for long-term (current) use of anticoagulants 12/09/2010  . Hypoxemia 05/15/2009  . Atrial fibrillation 01/19/2009  . CORONARY ARTERY DISEASE 10/07/2007  . AORTIC STENOSIS, SEVERE 10/07/2007  . PVD 10/07/2007  . HYPERLIPIDEMIA 10/07/2007  . HYPERTENSION 10/07/2007  . CEREBROVASCULAR DISEASE 10/07/2007  . DYSPNEA 02/14/2008  . OBSTRUCTIVE SLEEP APNEA 10/07/2007  . Asbestosis 02/21/2008  . History of colon cancer   . DDD (degenerative disc disease)     low back  . Colon polyp   . Pneumonia   . Respiratory failure   . Leukocytosis   . Thrombocytopenia   . Dehydration   . Dementia   . CHF  (congestive heart failure)    Medications:  Scheduled:     . acetaminophen  1,000 mg Oral BID  . acetaminophen  650 mg Oral Once  . albuterol  2.5 mg Nebulization Q6H  . arformoterol  15 mcg Nebulization BID  . aspirin EC  81 mg Oral Daily  . budesonide  0.25 mg Nebulization BID  . clopidogrel  75 mg Oral Q breakfast  . donepezil  5 mg Oral Daily  . enoxaparin  40 mg Subcutaneous Q24H  . feeding supplement  237 mL Oral BID BM  . furosemide  40 mg Intravenous Once  . furosemide  40 mg Intravenous BID  . imipenem-cilastatin  500 mg Intravenous Q8H  . pantoprazole  40 mg Oral Daily  . simvastatin  20 mg Oral q1800  . sodium chloride  3 mL Intravenous Q12H  . tiotropium  18 mcg Inhalation Daily  . vancomycin  1,000 mg Intravenous Once  . vancomycin  1,000 mg Intravenous Q12H  . DISCONTD: arformoterol  15 mcg Nebulization BID   Assessment: Good renal fxn  Goal of Therapy:  Vancomycin trough level 15-20 mcg/ml  Plan: Vancomycin 1gm iv q12hrs Check trough Friday Labs per protocol  [name] A 09/25/2011,8:24 AM

## 2011-09-25 NOTE — Progress Notes (Signed)
Pt refused to wear C-PAP.

## 2011-09-25 NOTE — Progress Notes (Signed)
Pt admitted from [facility]. Spoke to pts daughter who is crying,upset over her father choking on his food. RN reassured the daughter that speech therapy is to evaluate him today. Cm referral to [facility] hospital chaplain.

## 2011-09-25 NOTE — Consult Note (Signed)
Referring Provider: Christiane Ha, MD Primary Care Physician:  Terald Sleeper, MD, MD Primary Gastroenterologist:  Jonette Eva, MD  Reason for Consultation:  dysphagia  HPI: [name] is a 78 y.o. male admitted with CHF/possible PNA. We were consulted regarding possible esophageal dysphagia in setting of previous esophageal strictures requiring multiple dilations. Last EGD 12/2007 for food impaction but esophagus not dilated as patient was on coumadin at that time. He was supposed to come back for EGD/ED but according to our office chart he stated he was going to back to his GI doctor in [place]. According to his daughter, he never did this.  History provided by the daughter and medical record. Patient with some dementia and hard of hearing. The patient has episodes of food impaction intermittently. Sometimes he is able to wash it down with liquids. At other times he may have several hours of being unable to wash the food down. When this occurs he'll have the food and liquid come back. Sometimes he is unable to swallow his saliva.  Patient had a stroke in October 2012 after coming off of his Coumadin. Coumadin was stopped because of multiple falls. Initially he had some minimal oral pharyngeal dysphagia was on thickened liquids. Swallowing improved and he was graduated to a regular diet. Since admission he has had several episodes of difficulty swallowing, food and liquid regurgitating. Unable to eat supper or breakfast at all. Unable to take pills. Seemed to be worse after giving some medication down in ED yesterday.   Speech therapy was asked to evaluate the patient today. Findings as outlined below. Clinical Impression Statement: Min oropharyngeal phase dysphagia characterized by decreased lingual movements and delay in swallow initiation which results in mild lingual residuals. Pt's symptoms are more consistent with esophageal dysmotility/obstrucion. I recommend GI consult with  possible EGD/dilitation. If patient wants to eat until seen by GI, recommend soft diet with thin liquids wit suction at bedside. If symptoms recurr, make NPO. Will discuss with Dr. Lendell Caprice.  Risk for Aspiration: Moderate (post prandial from regurgitation)  Prior to Admission medications   Medication Sig Start Date End Date Taking? Authorizing Provider  acetaminophen (TYLENOL) 500 MG tablet Take 1,000 mg by mouth 2 (two) times daily.   Yes Historical Provider, MD  acetaminophen (TYLENOL) 500 MG tablet Take 500 mg by mouth every 6 (six) hours as needed. Only one additional dose if needed with the scheduled dose   Yes Historical Provider, MD  albuterol (PROVENTIL) (2.5 MG/3ML) 0.083% nebulizer solution Take 2.5 mg by nebulization every 6 (six) hours as needed. For wheezing/ shortness of breath   Yes Historical Provider, MD  arformoterol (BROVANA) 15 MCG/2ML NEBU Take 15 mcg by nebulization 2 (two) times daily.    Yes Historical Provider, MD  aspirin 81 MG EC tablet Take 81 mg by mouth daily.   07/07/11 07/06/12 Yes Annie Main, NP  budesonide (PULMICORT) 0.25 MG/2ML nebulizer solution Take 0.25 mg by nebulization 2 (two) times daily.     Yes Historical Provider, MD  celecoxib (CELEBREX) 100 MG capsule Take 100 mg by mouth 2 (two) times daily.   Yes Historical Provider, MD  clopidogrel (PLAVIX) 75 MG tablet Take 75 mg by mouth daily with breakfast.   07/07/11 07/06/12 Yes Annie Main, NP  donepezil (ARICEPT) 5 MG tablet Take 5 mg by mouth daily.     Yes Historical Provider, MD  feeding supplement (ENSURE CLINICAL STRENGTH) LIQD Take 237 mLs by mouth 2 (two) times daily between meals. 08/22/11  Yes Stephani Police, PA  guaiFENesin (MUCINEX) 600 MG 12 hr tablet Take 1 tablet (600 mg total) by mouth 2 (two) times daily as needed. 08/22/11 08/21/12 Yes Marianne L York, PA  lansoprazole (PREVACID) 30 MG capsule Take 30 mg by mouth daily.     Yes Historical Provider, MD  Multiple Vitamin (MULTIVITAMIN) tablet  Take 1 tablet by mouth daily.    Yes Historical Provider, MD  pravastatin (PRAVACHOL) 40 MG tablet Take 40 mg by mouth at bedtime.    Yes Historical Provider, MD  tiotropium (SPIRIVA HANDIHALER) 18 MCG inhalation capsule Place 18 mcg into inhaler and inhale daily.   Yes Historical Provider, MD    Current Facility-Administered Medications  Medication Dose Route Frequency Provider Last Rate Last Dose  . 0.9 %  sodium chloride infusion  250 mL Intravenous PRN Christiane Ha, MD      . acetaminophen (TYLENOL) tablet 500 mg  500 mg Oral Q6H PRN Christiane Ha, MD   500 mg at 09/25/11 1148  . albuterol (PROVENTIL) (5 MG/ML) 0.5% nebulizer solution 2.5 mg  2.5 mg Nebulization Q6H Christiane Ha, MD   2.5 mg at 09/25/11 1437  . alum & mag hydroxide-simeth (MAALOX/MYLANTA) 200-200-20 MG/5ML suspension 30 mL  30 mL Oral Q6H PRN Christiane Ha, MD      . arformoterol [facility]) nebulizer solution 15 mcg  15 mcg Nebulization BID Christiane Ha, MD      . aspirin EC tablet 81 mg  81 mg Oral Daily Christiane Ha, MD   81 mg at 09/25/11 1026  . budesonide (PULMICORT) nebulizer solution 0.25 mg  0.25 mg Nebulization BID Christiane Ha, MD   0.25 mg at 09/25/11 0906  . clopidogrel (PLAVIX) tablet 75 mg  75 mg Oral Q breakfast Christiane Ha, MD   75 mg at 09/25/11 0830  . donepezil (ARICEPT) tablet 5 mg  5 mg Oral Daily Christiane Ha, MD   5 mg at 09/25/11 1026  . feeding supplement (ENSURE CLINICAL STRENGTH) liquid 237 mL  237 mL Oral BID BM Christiane Ha, MD   237 mL at 09/24/11 1500  . furosemide (LASIX) injection 40 mg  40 mg Intravenous BID Christiane Ha, MD   40 mg at 09/25/11 0830  . guaiFENesin-dextromethorphan (ROBITUSSIN DM) 100-10 MG/5ML syrup 5 mL  5 mL Oral Q4H PRN Christiane Ha, MD      . imipenem-cilastatin (PRIMAXIN) 500 mg in sodium chloride 0.9 % 100 mL IVPB  500 mg Intravenous Q8H Christiane Ha, MD   500 mg at 09/25/11 1441  . magnesium  hydroxide (MILK OF MAGNESIA) suspension 30 mL  30 mL Oral Daily PRN Christiane Ha, MD      . ondansetron [facility]) tablet 4 mg  4 mg Oral Q6H PRN Christiane Ha, MD       Or  . ondansetron (ZOFRAN) injection 4 mg  4 mg Intravenous Q6H PRN Christiane Ha, MD      . pantoprazole (PROTONIX) EC tablet 40 mg  40 mg Oral Daily Christiane Ha, MD   40 mg at 09/25/11 1026  . simvastatin (ZOCOR) tablet 20 mg  20 mg Oral q1800 Christiane Ha, MD   20 mg at 09/24/11 1706  . sodium chloride 0.9 % injection 3 mL  3 mL Intravenous Q12H Christiane Ha, MD   3 mL at 09/25/11 1027  . sodium chloride 0.9 % injection 3 mL  3 mL Intravenous PRN Christiane Ha, MD      . tiotropium [facility]) inhalation capsule 18 mcg  18 mcg Inhalation Daily Christiane Ha, MD   18 mcg at 09/25/11 0906  . vancomycin (VANCOCIN) IVPB 1000 mg/200 mL premix  1,000 mg Intravenous Once Christiane Ha, MD   1,000 mg at 09/24/11 1609  . vancomycin (VANCOCIN) IVPB 1000 mg/200 mL premix  1,000 mg Intravenous Q12H Christiane Ha, MD   1,000 mg at 09/25/11 1550  . DISCONTD: acetaminophen (TYLENOL) tablet 1,000 mg  1,000 mg Oral BID Christiane Ha, MD   1,000 mg at 09/24/11 2108  . DISCONTD: arformoterol (BROVANA) nebulizer solution 15 mcg  15 mcg Nebulization BID Christiane Ha, MD      . DISCONTD: enoxaparin (LOVENOX) injection 40 mg  40 mg Subcutaneous Q24H Christiane Ha, MD   40 mg at 09/24/11 1523    Allergies as of 09/24/2011 - Review Complete 09/24/2011  Allergen Reaction Noted  . Morphine Nausea And Vomiting 01/01/2010  . Ultram (tramadol hcl) Other (See Comments) 08/17/2011    Past Medical History  Diagnosis Date  . Encounter for long-term (current) use of anticoagulants 12/09/2010  . Hypoxemia 05/15/2009  . Atrial fibrillation 01/19/2009  . CORONARY ARTERY DISEASE 10/07/2007  . AORTIC STENOSIS, SEVERE 10/07/2007  . PVD 10/07/2007  . HYPERLIPIDEMIA 10/07/2007  . HYPERTENSION 10/07/2007    . CEREBROVASCULAR DISEASE 10/07/2007  . DYSPNEA 02/14/2008  . OBSTRUCTIVE SLEEP APNEA 10/07/2007  . Asbestosis 02/21/2008  . History of colon cancer   . DDD (degenerative disc disease)     low back  . Colon polyp   . Pneumonia   . Respiratory failure   . Leukocytosis   . Thrombocytopenia   . Dehydration   . Dementia   . CHF (congestive heart failure)   . Esophageal stricture     multiple dilation    Past Surgical History  Procedure Date  . Abdominal aortic aneurysm repair 1992  . Coronary artery bypass graft 2005  . Aortic valve replacement 2005  . Knee surgery     left  . Eye surgery   . Esophagogastroduodenoscopy 4/09    Dr. Rosealee Albee food bolus, distal esophageal ring narrowed esophagus to 11-61mm. Patient was on coumadin for no dilation. Supposed to come back but did not.  . Aorto-femoral bypass graft     Family History  Problem Relation Age of Onset  . COPD    . Colon cancer      1/2 sister  . Lung cancer    . Heart disease    . Coronary artery disease    . Stroke Mother 66  . Heart disease Father     History   Social History  . Marital Status: Married    Spouse Name: N/A    Number of Children: N/A  . Years of Education: N/A   Occupational History  . Not on file.   Social History Main Topics  . Smoking status: Former Smoker -- 1.0 packs/day for 20 years    Types: Cigarettes    Quit date: 12/16/1973  . Smokeless tobacco: Never Used  . Alcohol Use: No  . Drug Use: No  . Sexually Active: No   Other Topics Concern  . Not on file   Social History Narrative  . No narrative on file     ROS: Patient unable to provide.   General: Negative for anorexia, weight loss. Left side weak since cva 06/2011. Eyes:  Negative for vision changes. Poor vision due to macular degeneration. ENT: Negative for hoarseness. See HPI.  CV: Positive for productive cough. No peripheral edema.  Respiratory: Negative for dyspnea at rest, dyspnea on exertion. Positive  for cough, sputum. GI: See history of present illness. GU:  Negative for dysuria, hematuria, urinary incontinence, urinary frequency, nocturnal urination.  MS: Negative for joint pain, low back pain.  Derm: Negative for rash or itching.  Neuro: Positive for weakness since cva. Some memory loss, confusion.  Psych: Negative for anxiety, depression, suicidal ideation, hallucinations.  Endo: Negative for unusual weight change.  Heme: Negative for bruising or bleeding. Allergy: Negative for rash or hives.       Physical Examination: Vital signs in last 24 hours: Temp:  [96.9 F (36.1 C)-99.4 F (37.4 C)] 96.9 F (36.1 C) (01/24 1425) Pulse Rate:  [87-108] 87  (01/24 1425) Resp:  [20] 20  (01/24 1425) BP: (110-119)/(66-69) 119/69 mmHg (01/24 1425) SpO2:  [93 %-96 %] 96 % (01/24 1438) Weight:  [159 lb 2.8 oz (72.2 kg)] 159 lb 2.8 oz (72.2 kg) (01/24 1001)    General: Elderly WM in no acute distress.  Head: Normocephalic, atraumatic.   Eyes: Conjunctiva pink, no icterus. Mouth: Oropharyngeal mucosa moist and pink , no lesions erythema or exudate. Neck: Supple without thyromegaly, masses, or lymphadenopathy.  Lungs: Decreased breath sounds in bases. No rhonchi, wheezes.  Heart: Regular rate and rhythm, no murmurs rubs or gallops.  Abdomen: Bowel sounds are normal, nontender, nondistended, no hepatosplenomegaly or masses, no abdominal bruits or    hernia , no rebound or guarding.   Rectal: Not performed. Extremities: No lower extremity edema, clubbing, deformity.  Neuro: Alert and oriented x 4 , grossly normal neurologically.  Skin: Warm and dry, no rash or jaundice.   Psych: Alert and cooperative, normal mood and affect.        Intake/Output from previous day: 01/23 0701 - 01/24 0700 In: 20 [P.O.:20] Out: 200 [Urine:200] Intake/Output this shift:    Lab Results: CBC  Basename 09/25/11 0510 09/24/11 0932  WBC 13.9* 19.4*  HGB 11.3* 12.6*  HCT 34.0* 37.3*  MCV 92.1 91.2    PLT 236 251   BMET  Basename 09/24/11 0932  NA 135  K 4.3  CL 101  CO2 25  GLUCOSE 180*  BUN 19  CREATININE 0.82  CALCIUM 9.4      Imaging Studies: Dg Chest Port 1 View  09/25/2011  *RADIOLOGY REPORT*  Clinical Data: CHF, cough  PORTABLE CHEST - 1 VIEW  Comparison: Portable exam 1444 hours compared to 09/24/2011  Findings: Enlargement of cardiac silhouette post CABG. Tortuous aorta with atherosclerotic calcification. Pulmonary vascular congestion. Diffuse interstitial infiltrates compatible with pulmonary edema and CHF. Small right pleural effusion. No pneumothorax.  IMPRESSION: CHF, little changed.  Original Report Authenticated By: Lollie Marrow, M.D.     Impression: 78 year old gentleman with history of esophageal strictures in the past requiring multiple dilations. Last EGD done in 2009 for food impaction. Esophageal ring narrowed the esophagus down to 11 mm. He was not dilated at that time as he was on Coumadin. Patient felled to followup. Speech therapy evaluation and appreciated. Agree with the need to evaluate esophagus with likely dilation. Discussed this with daughter today at bedside. She is in agreement with the plan. Potential EGD with dilation in the morning. We'll need to reevaluate his pulmonary status prior to proceeding.  Plan: 1. Reevaluate patient in the morning, potential EGD/ED if stable. 2. Clear  liquids, NPO past MN.  I would like to thank Dr. Lendell Caprice for allowing [place] to take part in the care of this nice patient.    LOS: 1 day   Tana Coast  09/25/2011, 3:58 PM  Pt seen and examined; agree with above assessment and recommendations. Discussed with daughter at length.  He can be dilated safely on Plavix.

## 2011-09-25 NOTE — Progress Notes (Signed)
Upon assessment, pt was only alert and oriented to person. Based on other RN assessments from the beginning of his last admission, this appears to be the patients baseline. He seemed to get more oriented prior to his discharge at the last visit. Also noted, pt has hx of dementia. Sheryn Bison

## 2011-09-25 NOTE — Progress Notes (Signed)
UR Chart Review Completed  

## 2011-09-25 NOTE — Progress Notes (Signed)
Present with patient and daughter for emotional and spiritual support.

## 2011-09-25 NOTE — Progress Notes (Signed)
Speech Language/Pathology Clinical/Bedside Swallow Evaluation Patient Details  Name: [name] MRN: [id number] DOB: May 16, 1922 Today's Date: 09/25/2011  Past Medical History:  Past Medical History  Diagnosis Date  . Encounter for long-term (current) use of anticoagulants 12/09/2010  . Hypoxemia 05/15/2009  . Atrial fibrillation 01/19/2009  . CORONARY ARTERY DISEASE 10/07/2007  . AORTIC STENOSIS, SEVERE 10/07/2007  . PVD 10/07/2007  . HYPERLIPIDEMIA 10/07/2007  . HYPERTENSION 10/07/2007  . CEREBROVASCULAR DISEASE 10/07/2007  . DYSPNEA 02/14/2008  . OBSTRUCTIVE SLEEP APNEA 10/07/2007  . Asbestosis 02/21/2008  . History of colon cancer   . DDD (degenerative disc disease)     low back  . Colon polyp   . Pneumonia   . Respiratory failure   . Leukocytosis   . Thrombocytopenia   . Dehydration   . Dementia   . CHF (congestive heart failure)    Past Surgical History:  Past Surgical History  Procedure Date  . Abdominal aortic aneurysm repair 1992  . Coronary artery bypass graft 2005  . Aortic valve replacement 2005  . Knee surgery     left  . Eye surgery    HPI:  [name] is an 78 year old man admitted from the [facility] with CHF and possible pna. His daughter is at bedside and provides history. He had a stroke in October 2012 and needed to be on mech soft diet with nectar-thick liquids for a few weeks, but was upgraded to regular/thin during his stay at [facility]. He was admitted to [facility] in December and transferred to [facility]. His daughter states that he has had to have his esophagus stretched numerous times, but has not had it dilated recently. Per her report, he was seen by GI at [facility] to have a piece of [place] removed and the doctor recommended dilation, but pt declined. He developed acute onset of dysphagia yesterday pm after taking two tyelenols. They state that the food "kept coming back up". It sounds like he may need to be seen my GI for possible EGD with dilation.    Assessment/Recommendations/Treatment Plan Suspected Esophageal Findings Suspected Esophageal Findings: Regurgitation;Globus sensation (Pt with history of hiatal hernia, regurgitation)  SLP Assessment Clinical Impression Statement: Min oropharyngeal phase dysphagia characterized by decreased lingual movements and delay in swallow initiation which results in mild lingual residuals. Pt's symptoms are more consistent with esophageal dysmotility/obstrucion. I recommend GI consult with possible EGD/dilitation. If patient wants to eat until seen by GI, recommend soft diet with thin liquids wit suction at bedside. If symptoms recurr, make NPO. Will discuss with Dr. Lendell Caprice. Risk for Aspiration: Moderate (post prandial from regurgitation) Other Related Risk Factors: History of dysphagia;History of esophageal-related issues;Decreased respiratory status  Swallow Evaluation Recommendations Recommended Consults: Consider GI evaluation;Consider esophageal assessment Solid Consistency: Dysphagia 3 (Mechanical soft) Liquid Consistency: Thin Liquid Administration via: Cup;Straw Medication Administration: Crushed with puree (crush large pills as able in puree until seen by GI) Supervision: Staff feed patient Compensations: Slow rate;Small sips/bites Postural Changes and/or Swallow Maneuvers: Seated upright 90 degrees;Upright 30-60 min after meal Oral Care Recommendations: Oral care BID;Staff/trained caregiver to provide oral care Other Recommendations: Clarify dietary restrictions Follow up Recommendations: Skilled Nursing facility  Treatment Plan Treatment Plan Recommendations: No treatment recommended at this time;Other (Comment) (Will follow per GI and Dr. Pincus Sanes recommendations) Interventions: Diet toleration management by SLP  Prognosis Prognosis for Safe Diet Advancement: Fair  Individuals Consulted Consulted and Agree with Results and Recommendations: Patient;Family  member/caregiver;MD;RN Family Member Consulted: Wife and daughter  Swallowing Goals  N/A  Swallow Study Prior Functional Status  Cognitive/Linguistic Baseline: Baseline deficits Baseline deficit details: mild dementia Type of Home: Skilled Nursing Facility Receives Help From: Personal care attendant Vocation: Retired  General  Date of Onset: 09/24/11 HPI: [name] is an 77 year old man admitted from the [facility] with CHF and possible pna. His daughter is at bedside and provides history. He had a stroke in October 2012 and needed to be on mech soft diet with nectar-thick liquids for a few weeks, but was upgraded to regular/thin during his stay at [facility]. He was admitted to [facility] in December and transferred to [facility]. His daughter states that he has had to have his esophagus stretched numerous times, but has not had it dilated recently. Per her report, he was seen by GI at [facility] to have a piece of [place] removed and the doctor recommended dilation, but pt declined. He developed acute onset of dysphagia yesterday pm after taking two tyelenols. They state that the food "kept coming back up". It sounds like he may need to be seen my GI for possible EGD with dilation. Type of Study: Bedside swallow evaluation Diet Prior to this Study: NPO (PTA pt consumed regular/thin) Temperature Spikes Noted: No (daughter reports positive temp spike yesterday) Respiratory Status: Supplemental O2 delivered via (comment) (2L nasal cannula) History of Intubation: No Behavior/Cognition: Alert;Cooperative;Pleasant mood;Requires cueing Oral Cavity - Dentition: Adequate natural dentition Vision: Functional for self-feeding Patient Positioning: Upright in bed Baseline Vocal Quality: Clear Volitional Cough: Strong;Congested Volitional Swallow: Unable to elicit  Oral Motor/Sensory Function  Overall Oral Motor/Sensory Function: Appears within functional limits for tasks assessed Labial ROM: Reduced right;Reduced  left Labial Symmetry: Within Functional Limits Labial Strength: Within Functional Limits Labial Sensation: Within Functional Limits Lingual ROM: Within Functional Limits Lingual Symmetry: Within Functional Limits Lingual Strength: Within Functional Limits Lingual Sensation: Within Functional Limits Facial ROM: Within Functional Limits Facial Symmetry: Within Functional Limits Facial Strength: Within Functional Limits Facial Sensation: Within Functional Limits Velum: Within Functional Limits Mandible: Within Functional Limits  Consistency Results  Ice Chips Ice chips: Impaired Presentation: Spoon Pharyngeal Phase Impairments: Delayed Swallow  Thin Liquid Thin Liquid: Within functional limits Presentation: Straw  Nectar Thick Liquid Nectar Thick Liquid: Not tested  Honey Thick Liquid Honey Thick Liquid: Not tested  Puree Puree: Within functional limits Presentation: Spoon  Solid Solid: Impaired Oral Phase Impairments: Reduced lingual movement/coordination Oral Phase Functional Implications: Oral residue Other Comments: lingual residuals which clear with alternating liquids  Thank you for this referral, Havery Moros, CCC-SLP [phone]  Zekiah Caruth 09/25/2011,12:27 PM

## 2011-09-25 NOTE — Progress Notes (Signed)
AT 0624, pt had a 7 beat run of vtach. Pt was asymptomatic, and returned to his baseline. Day shift RN notified of this, will continue to monitor for any changes. Sheryn Bison

## 2011-09-25 NOTE — Progress Notes (Signed)
Upon assessment at 2100, pt was complaining of pain in both of his knees. He appeared to be having some cramps periodically. I administered tylenol as ordered for pain, and also removed his SCDs at this time to see if that was the cause of the discomfort. Upon reassessment, pt appeared much more comfortable and resting, it was difficult to understand his response to if he was having pain or not, but I continued to allow him to rest and reassessed him again at 2300. Pt is resting well at this time and denies pain. Will  Continue to monitor.

## 2011-09-25 NOTE — Progress Notes (Signed)
Subjective: Daughter has noticed "choking spells". Patient appears to be regurgitating swallowed food and liquids after taking anything by mouth. She reports that he had previously been on chopped food with nectar thickened liquids. Apparently a repeat modified barium swallow follow at nursing home showed improvement in his diet was advanced. Per nursing staff, he was able to take his pills this morning.  Objective: Vital signs in last 24 hours: Filed Vitals:   09/25/11 0236 09/25/11 0616 09/25/11 0907 09/25/11 1001  BP:  110/66    Pulse:  94    Temp:  97.4 F (36.3 C)    TempSrc:  Oral    Resp: 20 20    Height:      Weight:    72.2 kg (159 lb 2.8 oz)  SpO2: 95% 94% 95%    Weight change:   Intake/Output Summary (Last 24 hours) at 09/25/11 1011 Last data filed at 09/24/11 1900  Gross per 24 hour  Intake     20 ml  Output    200 ml  Net   -180 ml   Physical Exam: General: Comfortable. Occasional wet cough. Lungs rhonchi Cardiovascular regular rate rhythm without murmurs gallops rubs Abdomen soft nontender nondistended Extremities no clubbing cyanosis or edema  Lab Results: Basic Metabolic Panel:  Lab 09/24/11 8295  NA 135  K 4.3  CL 101  CO2 25  GLUCOSE 180*  BUN 19  CREATININE 0.82  CALCIUM 9.4  MG --  PHOS --   Liver Function Tests: No results found for this basename: AST:2,ALT:2,ALKPHOS:2,BILITOT:2,PROT:2,ALBUMIN:2 in the last 168 hours No results found for this basename: LIPASE:2,AMYLASE:2 in the last 168 hours No results found for this basename: AMMONIA:2 in the last 168 hours CBC:  Lab 09/25/11 0510 09/24/11 0932  WBC 13.9* 19.4*  NEUTROABS 10.0* 16.3*  HGB 11.3* 12.6*  HCT 34.0* 37.3*  MCV 92.1 91.2  PLT 236 251   Cardiac Enzymes:  Lab 09/24/11 0932  CKTOTAL 57  CKMB 2.4  CKMBINDEX --  TROPONINI <0.30   BNP:  Lab 09/24/11 0932  PROBNP 2908.0*   D-Dimer: No results found for this basename: DDIMER:2 in the last 168 hours CBG:  Lab  09/25/11 0739 09/24/11 2114 09/24/11 1700 09/24/11 0602 09/22/11 0540 09/20/11 2016  GLUCAP 104* 122* 165* 137* 100* 97   Hemoglobin A1C: No results found for this basename: HGBA1C in the last 168 hours Fasting Lipid Panel: No results found for this basename: CHOL,HDL,LDLCALC,TRIG,CHOLHDL,LDLDIRECT in the last 621 hours Thyroid Function Tests:  Lab 09/24/11 0932  TSH 1.081  T4TOTAL --  FREET4 --  T3FREE --  THYROIDAB --   Micro Results: Recent Results (from the past 240 hour(s))  MRSA PCR SCREENING     Status: Normal   Collection Time   09/24/11  4:54 PM      Component Value Range Status Comment   MRSA by PCR NEGATIVE  NEGATIVE  Final    Studies/Results: Dg Chest Portable 1 View  09/24/2011  *RADIOLOGY REPORT*  Clinical Data: Cough and chest congestion.  PORTABLE CHEST - 1 VIEW  Comparison: None.  Findings: The patient has developed mild bilateral pulmonary edema. There is chronic cardiomegaly.  Pulmonary vascularity is slightly more prominent than on the prior study.  Chronic pleural thickening and calcification bilaterally.  IMPRESSION: Interval development of mild bilateral pulmonary edema.  Original Report Authenticated By: Gwynn Burly, M.D.   Scheduled Meds:   . acetaminophen  1,000 mg Oral BID  . acetaminophen  650 mg Oral  Once  . albuterol  2.5 mg Nebulization Q6H  . arformoterol  15 mcg Nebulization BID  . aspirin EC  81 mg Oral Daily  . budesonide  0.25 mg Nebulization BID  . clopidogrel  75 mg Oral Q breakfast  . donepezil  5 mg Oral Daily  . enoxaparin  40 mg Subcutaneous Q24H  . feeding supplement  237 mL Oral BID BM  . furosemide  40 mg Intravenous Once  . furosemide  40 mg Intravenous BID  . imipenem-cilastatin  500 mg Intravenous Q8H  . pantoprazole  40 mg Oral Daily  . simvastatin  20 mg Oral q1800  . sodium chloride  3 mL Intravenous Q12H  . tiotropium  18 mcg Inhalation Daily  . vancomycin  1,000 mg Intravenous Once  . vancomycin  1,000 mg  Intravenous Q12H  . DISCONTD: arformoterol  15 mcg Nebulization BID   Continuous Infusions:   . DISCONTD: sodium chloride 125 mL/hr at 09/24/11 1027   PRN Meds:.sodium chloride, acetaminophen, alum & mag hydroxide-simeth, guaiFENesin-dextromethorphan, magnesium hydroxide, ondansetron (ZOFRAN) IV, ondansetron, sodium chloride Assessment/Plan: Principal Problem:  *Acute diastolic congestive heart failure Active Problems:  HYPERTENSION  Atrial fibrillation  Leukocytosis  OBSTRUCTIVE SLEEP APNEA  Asbestosis  CORONARY ARTERY BYPASS GRAFT, HX OF  Dementia  OA (osteoarthritis)  Debility  Continue IV Lasix. Suspect aspiration. Keep him n.p.o. until speech consult. Repeat chest x-ray. Patient's leukocytosis and bandemia is improved. Continue broad-spectrum antibiotics for now. Patient has not been febrile. Family requests that he go back to the [facility] as soon CT is stabilized, because they are paying to hold his bed. I spoke with the daughter again. Patient's long-term prognosis for improvement is poor. Should he continue to decline, comfort care would be reasonable. She voices understanding and reports that her mother is "in denial".   LOS: 1 day   [name] L 09/25/2011, 10:11 AM

## 2011-09-25 NOTE — Progress Notes (Signed)
INITIAL ADULT NUTRITION ASSESSMENT Date: 09/25/2011   Time: 4:48 PM  Reason for Assessment: Nutrition Risk Report (unintentional weight loss, dysphagia)    ASSESSMENT: Male 78 y.o.  Dx: Acute diastolic congestive heart failure   Past Medical History  Diagnosis Date  . Encounter for long-term (current) use of anticoagulants 12/09/2010  . Hypoxemia 05/15/2009  . Atrial fibrillation 01/19/2009  . CORONARY ARTERY DISEASE 10/07/2007  . AORTIC STENOSIS, SEVERE 10/07/2007  . PVD 10/07/2007  . HYPERLIPIDEMIA 10/07/2007  . HYPERTENSION 10/07/2007  . CEREBROVASCULAR DISEASE 10/07/2007  . DYSPNEA 02/14/2008  . OBSTRUCTIVE SLEEP APNEA 10/07/2007  . Asbestosis 02/21/2008  . History of colon cancer   . DDD (degenerative disc disease)     low back  . Colon polyp   . Pneumonia   . Respiratory failure   . Leukocytosis   . Thrombocytopenia   . Dehydration   . Dementia   . CHF (congestive heart failure)   . Esophageal stricture     multiple dilation    Scheduled Meds:   . albuterol  2.5 mg Nebulization Q6H  . arformoterol  15 mcg Nebulization BID  . aspirin EC  81 mg Oral Daily  . budesonide  0.25 mg Nebulization BID  . clopidogrel  75 mg Oral Q breakfast  . donepezil  5 mg Oral Daily  . feeding supplement  237 mL Oral BID BM  . furosemide  40 mg Intravenous BID  . imipenem-cilastatin  500 mg Intravenous Q8H  . pantoprazole  40 mg Oral Daily  . simvastatin  20 mg Oral q1800  . sodium chloride  3 mL Intravenous Q12H  . tiotropium  18 mcg Inhalation Daily  . vancomycin  1,000 mg Intravenous Once  . vancomycin  1,000 mg Intravenous Q12H  . DISCONTD: acetaminophen  1,000 mg Oral BID  . DISCONTD: enoxaparin  40 mg Subcutaneous Q24H   Continuous Infusions:  PRN Meds:.sodium chloride, acetaminophen, alum & mag hydroxide-simeth, guaiFENesin-dextromethorphan, magnesium hydroxide, ondansetron (ZOFRAN) IV, ondansetron, sodium chloride Ht: 5\' 9"  (175.3 cm)  Wt: 159 lb 2.8 oz (72.2 kg)  Ideal Wt: 160#  (72.7kg) % Ideal Wt: 99%  Usual Wt: 186# % Usual Wt: 85%  Body mass index is 23.51 kg/(m^2). Normal weight  Food/Nutrition Related Hx:  Pt known to RD from [facility]. Wt has been stable within 2-3 # since admission to [facility]. His po intake averaged 60-75% meals. Hx of wt loss per daughter wt 186# six months ago. Severe wt loss 27#, 15% x 6 months. SLP evaluation reviewed. GI to follow.   CMP     Component Value Date/Time   NA 135 09/24/2011 0932   K 4.3 09/24/2011 0932   CL 101 09/24/2011 0932   CO2 25 09/24/2011 0932   GLUCOSE 180* 09/24/2011 0932   BUN 19 09/24/2011 0932   CREATININE 0.82 09/24/2011 0932   CALCIUM 9.4 09/24/2011 0932   PROT 6.5 08/17/2011 1721   ALBUMIN 2.9* 08/17/2011 1721   AST 22 08/17/2011 1721   ALT 28 08/17/2011 1721   ALKPHOS 90 08/17/2011 1721   BILITOT 0.2* 08/17/2011 1721   GFRNONAA 77* 09/24/2011 0932   GFRAA 90* 09/24/2011 0932    Intake/Output Summary (Last 24 hours) at 09/25/11 1657 Last data filed at 09/24/11 1900  Gross per 24 hour  Intake     20 ml  Output    200 ml  Net   -180 ml     Diet Order: NPO  Supplements/Tube Feeding:none at this time  IVF:  Estimated Nutritional Needs:   Kcal:1728-1872 kcal/day Protein:79-94 grams/day Fluid:1.7-1.9 L/day  NUTRITION DIAGNOSIS: -Unintended weight loss continues  RELATED TO: inadequate oral intake vs increased energy needs  AS EVIDENCE BY: 15% wt loss x 180 days  MONITORING/GOAL: pt will meet >75% est nutritional needs  EDUCATION NEEDS: -No education needs identified at this time  INTERVENTION: -Rec Ensure Clinical Strength BID between meals when pt is appropriate for full liquids  Dietitian [phone]  DOCUMENTATION CODES Per approved criteria  -Not Applicable    Francene Boyers 09/25/2011, 4:48 PM

## 2011-09-26 ENCOUNTER — Encounter (HOSPITAL_COMMUNITY)
Admission: EM | Disposition: A | Discharge status: Skilled Nursing Facility | Payer: Self-pay | Source: Home / Self Care | Attending: Internal Medicine

## 2011-09-26 ENCOUNTER — Encounter (HOSPITAL_COMMUNITY): Payer: Self-pay | Admitting: *Deleted

## 2011-09-26 ENCOUNTER — Other Ambulatory Visit: Payer: Self-pay | Admitting: Gastroenterology

## 2011-09-26 DIAGNOSIS — K222 Esophageal obstruction: Secondary | ICD-10-CM | POA: Diagnosis present

## 2011-09-26 DIAGNOSIS — K209 Esophagitis, unspecified without bleeding: Secondary | ICD-10-CM | POA: Diagnosis present

## 2011-09-26 DIAGNOSIS — K2990 Gastroduodenitis, unspecified, without bleeding: Secondary | ICD-10-CM

## 2011-09-26 DIAGNOSIS — K2970 Gastritis, unspecified, without bleeding: Secondary | ICD-10-CM

## 2011-09-26 DIAGNOSIS — R1310 Dysphagia, unspecified: Secondary | ICD-10-CM

## 2011-09-26 LAB — CBC
MCH: 30.7 pg (ref 26.0–34.0)
MCV: 91.8 fL (ref 78.0–100.0)
Platelets: 263 10*3/uL (ref 150–400)
RBC: 4.04 MIL/uL — ABNORMAL LOW (ref 4.22–5.81)
RDW: 15.2 % (ref 11.5–15.5)

## 2011-09-26 LAB — BASIC METABOLIC PANEL
CO2: 32 mEq/L (ref 19–32)
Calcium: 9.5 mg/dL (ref 8.4–10.5)
Creatinine, Ser: 0.87 mg/dL (ref 0.50–1.35)
Glucose, Bld: 131 mg/dL — ABNORMAL HIGH (ref 70–99)

## 2011-09-26 LAB — GLUCOSE, CAPILLARY

## 2011-09-26 LAB — VANCOMYCIN, TROUGH: Vancomycin Tr: 20 ug/mL (ref 10.0–20.0)

## 2011-09-26 SURGERY — ESOPHAGOGASTRODUODENOSCOPY (EGD) WITH ESOPHAGEAL DILATION
Anesthesia: Moderate Sedation

## 2011-09-26 MED ORDER — SODIUM CHLORIDE 0.9 % IV SOLN
750.0000 mg | Freq: Two times a day (BID) | INTRAVENOUS | Status: DC
  Administered 2011-09-27: 750 mg via INTRAVENOUS
  Filled 2011-09-26 (×5): qty 750

## 2011-09-26 MED ORDER — MIDAZOLAM HCL 5 MG/5ML IJ SOLN
INTRAMUSCULAR | Status: AC
  Filled 2011-09-26: qty 10

## 2011-09-26 MED ORDER — MIDAZOLAM HCL 5 MG/5ML IJ SOLN
INTRAMUSCULAR | Status: DC | PRN
  Administered 2011-09-26: 1 mg via INTRAVENOUS
  Administered 2011-09-26: 2 mg via INTRAVENOUS

## 2011-09-26 MED ORDER — SODIUM CHLORIDE 0.45 % IV SOLN
Freq: Once | INTRAVENOUS | Status: AC
  Administered 2011-09-26: 1000 mL via INTRAVENOUS

## 2011-09-26 MED ORDER — MINERAL OIL PO OIL
TOPICAL_OIL | ORAL | Status: AC
  Filled 2011-09-26: qty 30

## 2011-09-26 MED ORDER — POTASSIUM CHLORIDE 20 MEQ/15ML (10%) PO LIQD
20.0000 meq | Freq: Three times a day (TID) | ORAL | Status: DC
  Administered 2011-09-26 – 2011-09-27 (×3): 20 meq via ORAL
  Filled 2011-09-26 (×3): qty 30

## 2011-09-26 MED ORDER — FUROSEMIDE 40 MG PO TABS
40.0000 mg | ORAL_TABLET | Freq: Every day | ORAL | Status: DC
  Administered 2011-09-26 – 2011-09-27 (×2): 40 mg via ORAL
  Filled 2011-09-26 (×2): qty 1

## 2011-09-26 MED ORDER — PANTOPRAZOLE SODIUM 40 MG PO TBEC
40.0000 mg | DELAYED_RELEASE_TABLET | Freq: Two times a day (BID) | ORAL | Status: DC
  Administered 2011-09-26 – 2011-09-27 (×2): 40 mg via ORAL
  Filled 2011-09-26 (×4): qty 1

## 2011-09-26 MED ORDER — MEPERIDINE HCL 100 MG/ML IJ SOLN
INTRAMUSCULAR | Status: DC | PRN
  Administered 2011-09-26: 25 mg via INTRAVENOUS

## 2011-09-26 MED ORDER — MEPERIDINE HCL 100 MG/ML IJ SOLN
INTRAMUSCULAR | Status: AC
  Filled 2011-09-26: qty 1

## 2011-09-26 NOTE — Op Note (Signed)
[facility] [address] [place], [place]  [address]  ENDOSCOPY PROCEDURE REPORT  PATIENT:  [name], [name]  MR#:  [id number] BIRTHDATE:  May 04, 1922, 89 yrs. old  GENDER:  male  ENDOSCOPIST/1o GI MD:  Jonette Eva, MD ASSISTANT: Referred by:  DR. Lendell Caprice PCP: Baltazar Najjar, M.D.  PROCEDURE DATE:  09/26/2011 PROCEDURE:  EGD with dilatation over guidewire, EGD with biopsy ASA CLASS: INDICATIONS:  SOLID DYSPHAGIA, WEIGHT LOSS EGD 2009 TO REMOVE IMPACTED FOOD BOLUS. PT DID NOT RETURN FOR EGD/DIL PT TAKING ASA/PLAVIX.  MEDICATIONS:   Demerol 25 mg IV, Versed 3 mg IV TOPICAL ANESTHETIC:  Cetacaine Spray  DESCRIPTION OF PROCEDURE:   After the risks benefits and alternatives of the procedure were thoroughly explained, informed consent was obtained.  The EG-[id number] ([id number]) endoscope was introduced through the mouth and advanced to the second portion of the duodenum.  The instrument was slowly withdrawn as the mucosa was carefully examined.  Prior to withdrawal of the scope, the guidwire was placed.  The esophagus was dilated successfully.  The patient was recovered in endoscopy and discharged home in satisfactory condition. <<PROCEDUREIMAGES>>  A stricture was found in the distal esophagus WHICH WAS NARRWED TO 12-13 MM.  Esophagitis WITH LINEAR EROSIONS AND ULERATIONS was found in the distal esophagus.  Mild gastritis was found  BIOPSIED VIA COLD FORCEPS.    Dilation was then performed at the distal esophagus  1) Dilator:  Savary over guidewire  Size(s):  12.8 TO 16 MM Resistance:  minimal  Heme:  none Appearance:  COMPLICATIONS:  None  ENDOSCOPIC IMPRESSION: 1) Stricture in the distal esophagus 2) Esophagitis in the distal esophagus 3) Mild gastritis  RECOMMENDATIONS: BID PPI FOREVER OPV W/ SLF IN 3 MOS AWAIT BIOPSIES MAY CONTINUE ASA & PLAVIX.  REPEAT EXAM:  No  ______________________________ Jonette Eva, MD  CC:  n. eSIGNED:   Allora Bains at  09/26/2011 12:19 PM  Leitha Schuller, [phone]

## 2011-09-26 NOTE — Progress Notes (Signed)
Patient to have EGD/ED today. Arrangements made.

## 2011-09-26 NOTE — Progress Notes (Signed)
Subjective: No new complaints.  Objective: Vital signs in last 24 hours: Filed Vitals:   09/26/11 1200 09/26/11 1205 09/26/11 1210 09/26/11 1404  BP: 118/65 138/86  138/66  Pulse: 96 69 98 100  Temp:    97.7 F (36.5 C)  TempSrc:      Resp: 18 22 21 20   Height:      Weight:      SpO2: 95% 98% 99% 94%   Weight change: -2.3 kg (-5 lb 1.1 oz)  Intake/Output Summary (Last 24 hours) at 09/26/11 1437 Last data filed at 09/26/11 0600  Gross per 24 hour  Intake   1130 ml  Output      0 ml  Net   1130 ml   Physical Exam: General: Comfortable.  Lungs rhonchi Cardiovascular regular rate rhythm without murmurs gallops rubs Abdomen soft nontender nondistended Extremities no clubbing cyanosis or edema  Lab Results: Basic Metabolic Panel:  Lab 09/26/11 4132 09/24/11 0932  NA 135 135  K 3.4* 4.3  CL 97 101  CO2 32 25  GLUCOSE 131* 180*  BUN 18 19  CREATININE 0.87 0.82  CALCIUM 9.5 9.4  MG -- --  PHOS -- --   Liver Function Tests: No results found for this basename: AST:2,ALT:2,ALKPHOS:2,BILITOT:2,PROT:2,ALBUMIN:2 in the last 168 hours No results found for this basename: LIPASE:2,AMYLASE:2 in the last 168 hours No results found for this basename: AMMONIA:2 in the last 168 hours CBC:  Lab 09/26/11 0527 09/25/11 0510 09/24/11 0932  WBC 12.6* 13.9* --  NEUTROABS -- 10.0* 16.3*  HGB 12.4* 11.3* --  HCT 37.1* 34.0* --  MCV 91.8 92.1 --  PLT 263 236 --   Cardiac Enzymes:  Lab 09/24/11 0932  CKTOTAL 57  CKMB 2.4  CKMBINDEX --  TROPONINI <0.30   BNP:  Lab 09/26/11 0526 09/24/11 0932  PROBNP 688.3* 2908.0*   D-Dimer: No results found for this basename: DDIMER:2 in the last 168 hours CBG:  Lab 09/26/11 0718 09/25/11 2057 09/25/11 1604 09/25/11 1109 09/25/11 0739 09/24/11 2114  GLUCAP 126* 185* 134* 143* 104* 122*   Hemoglobin A1C: No results found for this basename: HGBA1C in the last 168 hours Fasting Lipid Panel: No results found for this basename:  CHOL,HDL,LDLCALC,TRIG,CHOLHDL,LDLDIRECT in the last 440 hours Thyroid Function Tests:  Lab 09/24/11 0932  TSH 1.081  T4TOTAL --  FREET4 --  T3FREE --  THYROIDAB --   Micro Results: Recent Results (from the past 240 hour(s))  URINE CULTURE     Status: Normal   Collection Time   09/24/11 10:10 AM      Component Value Range Status Comment   Specimen Description URINE, CATHETERIZED   Final    Special Requests NONE   Final    Setup Time [id number]   Final    Colony Count NO GROWTH   Final    Culture NO GROWTH   Final    Report Status 09/25/2011 FINAL   Final   CULTURE, BLOOD (ROUTINE X 2)     Status: Normal (Preliminary result)   Collection Time   09/24/11  1:59 PM      Component Value Range Status Comment   Specimen Description BLOOD RIGHT ANTECUBITAL   Final    Special Requests     Final    Value: BOTTLES DRAWN AEROBIC AND ANAEROBIC AEB=10CC ANA=8CC   Culture NO GROWTH 2 DAYS   Final    Report Status PENDING   Incomplete   CULTURE, BLOOD (ROUTINE X 2)  Status: Normal (Preliminary result)   Collection Time   09/24/11  2:07 PM      Component Value Range Status Comment   Specimen Description BLOOD LEFT HAND   Final    Special Requests BOTTLES DRAWN AEROBIC AND ANAEROBIC 5CC   Final    Culture NO GROWTH 2 DAYS   Final    Report Status PENDING   Incomplete   MRSA PCR SCREENING     Status: Normal   Collection Time   09/24/11  4:54 PM      Component Value Range Status Comment   MRSA by PCR NEGATIVE  NEGATIVE  Final    Studies/Results: Dg Chest Port 1 View  09/25/2011  *RADIOLOGY REPORT*  Clinical Data: CHF, cough  PORTABLE CHEST - 1 VIEW  Comparison: Portable exam 1444 hours compared to 09/24/2011  Findings: Enlargement of cardiac silhouette post CABG. Tortuous aorta with atherosclerotic calcification. Pulmonary vascular congestion. Diffuse interstitial infiltrates compatible with pulmonary edema and CHF. Small right pleural effusion. No pneumothorax.  IMPRESSION: CHF, little  changed.  Original Report Authenticated By: Lollie Marrow, M.D.   Scheduled Meds:    . sodium chloride   Intravenous Once  . albuterol  2.5 mg Nebulization Q6H  . arformoterol  15 mcg Nebulization BID  . aspirin EC  81 mg Oral Daily  . budesonide  0.25 mg Nebulization BID  . clopidogrel  75 mg Oral Q breakfast  . donepezil  5 mg Oral Daily  . furosemide  40 mg Oral Daily  . imipenem-cilastatin  500 mg Intravenous Q8H  . meperidine      . midazolam      . mineral oil      . pantoprazole  40 mg Oral BID AC  . potassium chloride  20 mEq Oral TID  . simvastatin  20 mg Oral q1800  . sodium chloride  3 mL Intravenous Q12H  . tiotropium  18 mcg Inhalation Daily  . vancomycin  1,000 mg Intravenous Q12H  . DISCONTD: feeding supplement  237 mL Oral BID BM  . DISCONTD: furosemide  40 mg Intravenous BID  . DISCONTD: pantoprazole  40 mg Oral Daily   Continuous Infusions:  PRN Meds:.sodium chloride, acetaminophen, alum & mag hydroxide-simeth, guaiFENesin-dextromethorphan, magnesium hydroxide, ondansetron (ZOFRAN) IV, ondansetron, sodium chloride, DISCONTD: meperidine, DISCONTD: midazolam Assessment/Plan: Principal Problem:  *Acute diastolic congestive heart failure Active Problems:  Esophageal stricture, status post dilatation  HYPERTENSION  Atrial fibrillation  Leukocytosis  Esophagitis  OBSTRUCTIVE SLEEP APNEA, noncompliant with CPAP  Asbestosis  CORONARY ARTERY BYPASS GRAFT, HX OF  Dementia  OA (osteoarthritis)  Debility Hypokalemia  Heart failure has improved based on pro BNP. Chest x-ray yesterday shows still showed pulmonary edema. No infiltrate on chest x-ray. Patient still has rhonchi on exam and may have an element of pulmonary infection as well. I will continue vancomycin and Primaxin for now. Monitor him on his current diet. Change Lasix to by mouth. Repeat chest x-ray and labs in the morning. Replete potassium. Back to skilled nursing facility as soon as patient is  medically stabilized; family is concerned about the cost of holding his bed. Possibly, discharge tomorrow if he remains stable unable to tolerate a diet.   LOS: 2 days   Dorrell Mitcheltree L 09/26/2011, 2:37 PM

## 2011-09-26 NOTE — Progress Notes (Signed)
REVIEWED.  

## 2011-09-26 NOTE — Consult Note (Signed)
ANTIBIOTIC CONSULT NOTE   Pharmacy Consult for Vancomycin Indication: fever, bandemia  Allergies  Allergen Reactions  . Morphine Nausea And Vomiting  . Ultram (Tramadol Hcl) Other (See Comments)    Made "wild"   Patient Measurements: Height: 5\' 9"  (175.3 cm) Weight: 166 lb 7.2 oz (75.5 kg) IBW/kg (Calculated) : 70.7   Vital Signs: Temp: 97.7 F (36.5 C) (01/25 1404) Temp src: Oral (01/25 1103) BP: 138/66 mmHg (01/25 1404) Pulse Rate: 100  (01/25 1404) Intake/Output from previous day: 01/24 0701 - 01/25 0700 In: 1138 [P.O.:240; I.V.:90; IV Piggyback:808] Out: -  Intake/Output from this shift:    Labs:  Basename 09/26/11 0527 09/25/11 0510 09/24/11 0932  WBC 12.6* 13.9* 19.4*  HGB 12.4* 11.3* 12.6*  PLT 263 236 251  LABCREA -- -- --  CREATININE 0.87 -- 0.82   Estimated Creatinine Clearance: 59.8 ml/min (by C-G formula based on Cr of 0.87).  Basename 09/26/11 1349  VANCOTROUGH 20.0  VANCOPEAK --  VANCORANDOM --  GENTTROUGH --  GENTPEAK --  GENTRANDOM --  TOBRATROUGH --  TOBRAPEAK --  TOBRARND --  AMIKACINPEAK --  AMIKACINTROU --  AMIKACIN --     Microbiology: Recent Results (from the past 720 hour(s))  URINE CULTURE     Status: Normal   Collection Time   09/24/11 10:10 AM      Component Value Range Status Comment   Specimen Description URINE, CATHETERIZED   Final    Special Requests NONE   Final    Setup Time [id number]   Final    Colony Count NO GROWTH   Final    Culture NO GROWTH   Final    Report Status 09/25/2011 FINAL   Final   CULTURE, BLOOD (ROUTINE X 2)     Status: Normal (Preliminary result)   Collection Time   09/24/11  1:59 PM      Component Value Range Status Comment   Specimen Description BLOOD RIGHT ANTECUBITAL   Final    Special Requests     Final    Value: BOTTLES DRAWN AEROBIC AND ANAEROBIC AEB=10CC ANA=8CC   Culture NO GROWTH 2 DAYS   Final    Report Status PENDING   Incomplete   CULTURE, BLOOD (ROUTINE X 2)     Status: Normal  (Preliminary result)   Collection Time   09/24/11  2:07 PM      Component Value Range Status Comment   Specimen Description BLOOD LEFT HAND   Final    Special Requests BOTTLES DRAWN AEROBIC AND ANAEROBIC 5CC   Final    Culture NO GROWTH 2 DAYS   Final    Report Status PENDING   Incomplete   MRSA PCR SCREENING     Status: Normal   Collection Time   09/24/11  4:54 PM      Component Value Range Status Comment   MRSA by PCR NEGATIVE  NEGATIVE  Final    Medical History: Past Medical History  Diagnosis Date  . Encounter for long-term (current) use of anticoagulants 12/09/2010  . Hypoxemia 05/15/2009  . Atrial fibrillation 01/19/2009  . CORONARY ARTERY DISEASE 10/07/2007  . AORTIC STENOSIS, SEVERE 10/07/2007  . PVD 10/07/2007  . HYPERLIPIDEMIA 10/07/2007  . HYPERTENSION 10/07/2007  . CEREBROVASCULAR DISEASE 10/07/2007  . DYSPNEA 02/14/2008  . OBSTRUCTIVE SLEEP APNEA 10/07/2007  . Asbestosis 02/21/2008  . History of colon cancer   . DDD (degenerative disc disease)     low back  . Colon polyp   . Pneumonia   .  Respiratory failure   . Leukocytosis   . Thrombocytopenia   . Dehydration   . Dementia   . CHF (congestive heart failure)   . Esophageal stricture     multiple dilation   Medications:  Scheduled:     . sodium chloride   Intravenous Once  . albuterol  2.5 mg Nebulization Q6H  . arformoterol  15 mcg Nebulization BID  . aspirin EC  81 mg Oral Daily  . budesonide  0.25 mg Nebulization BID  . clopidogrel  75 mg Oral Q breakfast  . donepezil  5 mg Oral Daily  . furosemide  40 mg Oral Daily  . imipenem-cilastatin  500 mg Intravenous Q8H  . meperidine      . midazolam      . mineral oil      . pantoprazole  40 mg Oral BID AC  . potassium chloride  20 mEq Oral TID  . simvastatin  20 mg Oral q1800  . sodium chloride  3 mL Intravenous Q12H  . tiotropium  18 mcg Inhalation Daily  . vancomycin  750 mg Intravenous Q12H  . DISCONTD: feeding supplement  237 mL Oral BID BM  . DISCONTD:  furosemide  40 mg Intravenous BID  . DISCONTD: pantoprazole  40 mg Oral Daily  . DISCONTD: vancomycin  1,000 mg Intravenous Q12H   Assessment: Good renal fxn Vancomycin trough 20 (upper acceptable range) Will decrease dose due to age and not to be above 20  Goal of Therapy:  Vancomycin trough level 15-20 mcg/ml  Plan: Change Vancomycin 750 mg iv q12hrs  Labs per protocol  Raquel James, Blaire Hodsdon Bennett 09/26/2011,3:14 PM

## 2011-09-27 ENCOUNTER — Inpatient Hospital Stay (HOSPITAL_COMMUNITY): Payer: Medicare Other

## 2011-09-27 ENCOUNTER — Inpatient Hospital Stay
Admission: RE | Admit: 2011-09-27 | Discharge: 2011-10-03 | Disposition: A | Discharge status: Other Acute Inpatient Hospital | Payer: Medicare Other | Source: Ambulatory Visit | Attending: Internal Medicine | Admitting: Internal Medicine

## 2011-09-27 LAB — CBC
HCT: 36.3 % — ABNORMAL LOW (ref 39.0–52.0)
Hemoglobin: 12 g/dL — ABNORMAL LOW (ref 13.0–17.0)
MCH: 30.4 pg (ref 26.0–34.0)
MCHC: 33.1 g/dL (ref 30.0–36.0)

## 2011-09-27 LAB — BASIC METABOLIC PANEL
BUN: 18 mg/dL (ref 6–23)
Calcium: 9.7 mg/dL (ref 8.4–10.5)
GFR calc non Af Amer: 75 mL/min — ABNORMAL LOW (ref >90)
Glucose, Bld: 124 mg/dL — ABNORMAL HIGH (ref 70–99)
Potassium: 3.6 mEq/L (ref 3.5–5.1)

## 2011-09-27 MED ORDER — FUROSEMIDE 40 MG PO TABS: 40.0000 mg | ORAL_TABLET | Freq: Every day | ORAL | Status: DC

## 2011-09-27 MED ORDER — INSULIN ASPART 100 UNIT/ML ~~LOC~~ SOLN: 0.0000 [IU] | Freq: Three times a day (TID) | SUBCUTANEOUS | Status: DC

## 2011-09-27 MED ORDER — SODIUM CHLORIDE 0.45 % IV SOLN
Freq: Once | INTRAVENOUS | Status: AC
  Administered 2011-09-27: 1000 mL via INTRAVENOUS

## 2011-09-27 MED ORDER — LANSOPRAZOLE 30 MG PO CPDR: 30.0000 mg | DELAYED_RELEASE_CAPSULE | Freq: Two times a day (BID) | ORAL | Status: DC

## 2011-09-27 NOTE — Progress Notes (Signed)
Patient was discharged to [facility].  Report called to Cline Crock and patient was transported by stretcher via tunnel in stable condition.

## 2011-09-27 NOTE — Discharge Summary (Signed)
Physician Discharge Summary  Patient ID: [name] MRN: [id number] DOB/AGE: August 19, 1922 78 y.o.  Admit date: 09/24/2011 Discharge date: 09/27/2011  Discharge Diagnoses:  Principal Problem:  *Acute diastolic congestive heart failure Active Problems:  Esophageal stricture, s/p dilitation  HYPERTENSION  Atrial fibrillation  Leukocytosis  Esophagitis  OBSTRUCTIVE SLEEP APNEA  Asbestosis  CORONARY ARTERY BYPASS GRAFT, HX OF  Dementia  OA (osteoarthritis)  Debility Gastritis Bioprosthetic aortic valve.  Medication List  As of 09/27/2011  8:26 AM   STOP taking these medications         albuterol (5 MG/ML) 0.5% nebulizer solution      celecoxib 100 MG capsule      ipratropium 0.02 % nebulizer solution      levofloxacin 500 MG tablet      metroNIDAZOLE 500 MG tablet      pantoprazole 40 MG tablet      PROBIOTIC PO         TAKE these medications         acetaminophen 500 MG tablet   Commonly known as: TYLENOL   Take 1,000 mg by mouth 2 (two) times daily.      acetaminophen 500 MG tablet   Commonly known as: TYLENOL   Take 500 mg by mouth every 6 (six) hours as needed. Only one additional dose if needed with the scheduled dose      albuterol (2.5 MG/3ML) 0.083% nebulizer solution   Commonly known as: PROVENTIL   Take 2.5 mg by nebulization every 6 (six) hours as needed. For wheezing/ shortness of breath      arformoterol 15 MCG/2ML Nebu   Commonly known as: BROVANA   Take 15 mcg by nebulization 2 (two) times daily.      aspirin 81 MG EC tablet   Take 81 mg by mouth daily.      budesonide 0.25 MG/2ML nebulizer solution   Commonly known as: PULMICORT   Take 0.25 mg by nebulization 2 (two) times daily.      clopidogrel 75 MG tablet   Commonly known as: PLAVIX   Take 75 mg by mouth daily with breakfast.      donepezil 5 MG tablet   Commonly known as: ARICEPT   Take 5 mg by mouth daily.      feeding supplement Liqd   Take 237 mLs by mouth 2 (two) times  daily between meals.      furosemide 40 MG tablet   Commonly known as: LASIX   Take 1 tablet (40 mg total) by mouth daily.      guaiFENesin 600 MG 12 hr tablet   Commonly known as: MUCINEX   Take 1 tablet (600 mg total) by mouth 2 (two) times daily as needed.      insulin aspart 100 UNIT/ML injection   Commonly known as: novoLOG   Inject 0-9 Units into the skin 3 (three) times daily with meals.      lansoprazole 30 MG capsule   Commonly known as: PREVACID   Take 30 mg by mouth twice daily      multivitamin tablet   Take 1 tablet by mouth daily.      pravastatin 40 MG tablet   Commonly known as: PRAVACHOL   Take 40 mg by mouth at bedtime.      SPIRIVA HANDIHALER 18 MCG inhalation capsule   Generic drug: tiotropium   Place 18 mcg into inhaler and inhale daily.  Discharge Orders    Future Orders Please Complete By Expires   Diet - low sodium heart healthy      Diet Carb Modified      Increase activity slowly      Discharge instructions      Comments:   Keep head of bed above 30 degrees. Reflux and aspiration precautions      Follow-up Information    Follow up with Terald Sleeper, MD in 1 week.         Disposition: Intermediate Care Facility  Discharged Condition: stable  Consults: Treatment Team:  Corbin Ade, MD  Labs:   Results for orders placed during the hospital encounter of 09/24/11 (from the past 48 hour(s))  GLUCOSE, CAPILLARY     Status: Abnormal   Collection Time   09/25/11 11:09 AM      Component Value Range Comment   Glucose-Capillary 143 (*) 70 - 99 (mg/dL)    Comment 1 Notify RN     GLUCOSE, CAPILLARY     Status: Abnormal   Collection Time   09/25/11  4:04 PM      Component Value Range Comment   Glucose-Capillary 134 (*) 70 - 99 (mg/dL)    Comment 1 Notify RN      Comment 2 Documented in Chart     GLUCOSE, CAPILLARY     Status: Abnormal   Collection Time   09/25/11  8:57 PM      Component Value Range Comment    Glucose-Capillary 185 (*) 70 - 99 (mg/dL)    Comment 1 Documented in Chart      Comment 2 Notify RN     PRO B NATRIURETIC PEPTIDE     Status: Abnormal   Collection Time   09/26/11  5:26 AM      Component Value Range Comment   Pro B Natriuretic peptide (BNP) 688.3 (*) 0 - 450 (pg/mL)   BASIC METABOLIC PANEL     Status: Abnormal   Collection Time   09/26/11  5:27 AM      Component Value Range Comment   Sodium 135  135 - 145 (mEq/L)    Potassium 3.4 (*) 3.5 - 5.1 (mEq/L) DELTA CHECK NOTED   Chloride 97  96 - 112 (mEq/L)    CO2 32  19 - 32 (mEq/L)    Glucose, Bld 131 (*) 70 - 99 (mg/dL)    BUN 18  6 - 23 (mg/dL)    Creatinine, Ser 1.61  0.50 - 1.35 (mg/dL)    Calcium 9.5  8.4 - 10.5 (mg/dL)    GFR calc non Af Amer 75 (*) >90 (mL/min)    GFR calc Af Amer 87 (*) >90 (mL/min)   CBC     Status: Abnormal   Collection Time   09/26/11  5:27 AM      Component Value Range Comment   WBC 12.6 (*) 4.0 - 10.5 (K/uL)    RBC 4.04 (*) 4.22 - 5.81 (MIL/uL)    Hemoglobin 12.4 (*) 13.0 - 17.0 (g/dL)    HCT 09.6 (*) 04.5 - 52.0 (%)    MCV 91.8  78.0 - 100.0 (fL)    MCH 30.7  26.0 - 34.0 (pg)    MCHC 33.4  30.0 - 36.0 (g/dL)    RDW 40.9  81.1 - 91.4 (%)    Platelets 263  150 - 400 (K/uL)   GLUCOSE, CAPILLARY     Status: Abnormal   Collection Time   09/26/11  7:18  AM      Component Value Range Comment   Glucose-Capillary 126 (*) 70 - 99 (mg/dL)    Comment 1 Notify RN     VANCOMYCIN, TROUGH     Status: Normal   Collection Time   09/26/11  1:49 PM      Component Value Range Comment   Vancomycin Tr 20.0  10.0 - 20.0 (ug/mL)   GLUCOSE, CAPILLARY     Status: Abnormal   Collection Time   09/26/11  4:51 PM      Component Value Range Comment   Glucose-Capillary 173 (*) 70 - 99 (mg/dL)    Comment 1 Notify RN      Comment 2 Documented in Chart     GLUCOSE, CAPILLARY     Status: Abnormal   Collection Time   09/26/11  9:10 PM      Component Value Range Comment   Glucose-Capillary 126 (*) 70 - 99 (mg/dL)     Comment 1 Notify RN      Comment 2 Documented in Chart     CBC     Status: Abnormal   Collection Time   09/27/11  6:45 AM      Component Value Range Comment   WBC 10.6 (*) 4.0 - 10.5 (K/uL)    RBC 3.95 (*) 4.22 - 5.81 (MIL/uL)    Hemoglobin 12.0 (*) 13.0 - 17.0 (g/dL)    HCT 16.1 (*) 09.6 - 52.0 (%)    MCV 91.9  78.0 - 100.0 (fL)    MCH 30.4  26.0 - 34.0 (pg)    MCHC 33.1  30.0 - 36.0 (g/dL)    RDW 04.5  40.9 - 81.1 (%)    Platelets 266  150 - 400 (K/uL)   BASIC METABOLIC PANEL     Status: Abnormal   Collection Time   09/27/11  6:45 AM      Component Value Range Comment   Sodium 136  135 - 145 (mEq/L)    Potassium 3.6  3.5 - 5.1 (mEq/L)    Chloride 95 (*) 96 - 112 (mEq/L)    CO2 32  19 - 32 (mEq/L)    Glucose, Bld 124 (*) 70 - 99 (mg/dL)    BUN 18  6 - 23 (mg/dL)    Creatinine, Ser 9.14  0.50 - 1.35 (mg/dL)    Calcium 9.7  8.4 - 10.5 (mg/dL)    GFR calc non Af Amer 75 (*) >90 (mL/min)    GFR calc Af Amer 87 (*) >90 (mL/min)     Diagnostics:  Dg Chest 1 View  09/15/2011  *RADIOLOGY REPORT*  Clinical Data: Pneumonia  CHEST - 1 VIEW  Comparison: 08/19/2011  Findings: Previous median sternotomy, CABG and aortic valve replacement.  Cardiomegaly persists.  There is chronic pleural thickening on the right.  Chronic calcific pleural plaques remain evident bilaterally.  Chronic pulmonary scarring remains evident. Markings appear slightly more prominent in the lung bases.  This could represent progressive scarring or minimal patchy basilar pneumonia.  No dense consolidation or major collapse.  IMPRESSION: Extensive chronic pleural and parenchymal findings as above. Question increasingly prominent patchy density at the lung bases could be progressive scarring or mild patchy basilar pneumonia.  Original Report Authenticated By: Thomasenia Sales, M.D.   Dg Chest Port 1 View  09/27/2011  *RADIOLOGY REPORT*  Clinical Data: Follow-up congestive heart failure.  Pneumonia.  PORTABLE CHEST - 1 VIEW   Comparison: 09/25/2011 and 09/24/2011.  Older studies dating back to 02/28/2011  radiographs and 09/11/2006 CT also correlated.  Findings: 0656 hours. There is chronic pleural disease with pleural thickening on the right and bilateral pleural calcifications. Superimposed interstitial prominence has improved.  There is mild residual volume loss in the right hemithorax.  The heart size and mediastinal contours are stable status post CABG and aortic valve replacement.  No significant pleural effusion is seen.  IMPRESSION:  1.  Improved interstitial prominence superimposed on chronic pleural parenchymal opacities. 2.  Current appearance has almost returned to baseline.  Original Report Authenticated By: Gerrianne Scale, M.D.   Dg Chest Port 1 View  09/25/2011  *RADIOLOGY REPORT*  Clinical Data: CHF, cough  PORTABLE CHEST - 1 VIEW  Comparison: Portable exam 1444 hours compared to 09/24/2011  Findings: Enlargement of cardiac silhouette post CABG. Tortuous aorta with atherosclerotic calcification. Pulmonary vascular congestion. Diffuse interstitial infiltrates compatible with pulmonary edema and CHF. Small right pleural effusion. No pneumothorax.  IMPRESSION: CHF, little changed.  Original Report Authenticated By: Lollie Marrow, M.D.   Dg Chest Portable 1 View  09/24/2011  *RADIOLOGY REPORT*  Clinical Data: Cough and chest congestion.  PORTABLE CHEST - 1 VIEW  Comparison: None.  Findings: The patient has developed mild bilateral pulmonary edema. There is chronic cardiomegaly.  Pulmonary vascularity is slightly more prominent than on the prior study.  Chronic pleural thickening and calcification bilaterally.  IMPRESSION: Interval development of mild bilateral pulmonary edema.  Original Report Authenticated By: Gwynn Burly, M.D.    Procedures: EGD with dilitation of distal esophageal stricture, biopsy  EKG: Atrial fibrillation Incomplete right bundle branch block Septal infarct , old No significant change  was found  Full Code   Hospital Course:  See H&P for complete admission details. The patient is a elderly debilitated demented 78 year old white male who was sent to the emergency room with chest congestion. He was unable to provide any history. All history was per family members and old records. The patient had previously been on Lasix but apparently this was stopped or the frequency was changed. He had an echocardiogram last fall which showed normal ejection fraction. He has a history of atrial fibrillation but is not a Coumadin candidate. He had been at the skilled nursing facility with worsening chest congestion. Apparently, he recently completed a course of levofloxacin. He had been started on Celebrex at the nursing home within the past week. Staff noted worsening congestion and sent him to the emergency room. He had a heart rate of 100. He was afebrile. Oxygen saturation respiratory rate and blood pressure were unremarkable. He had initially no wheezing or rhonchi, but was noted to be coughing or spitting up a lot of liquid. He had a leukocytosis with bandemia, elevated pro BNP, and a chest x-ray consistent with congestive heart failure. It was felt this was mainly acute diastolic heart failure. However, patient was started on broad-spectrum antibiotics because he had a leukocytosis and bandemia. Also, he was unable to provide any history so underlying pulmonary infection was a concern. Blood cultures were done and to date have been negative his pro calcitonin was not elevated, arguing against infection. Also his chest x-ray has cleared, so I suspect this was mainly a CHF exacerbation. He has remained on vancomycin and Primaxin while here but needs no further antibiotics unless he shows signs of infection. His white count today is normal. He was given Lasix. His pro BNP decreased and his chest x-ray cleared.  He has a history of previous stroke and dysphasia. He had  been on a dysphasia diet previously  which had been advanced over the past several months. Speech therapy was consulted and felt that he had no evidence of oral pharyngeal dysphasia but might have an esophageal stricture or dysmotility syndrome causing his problems with copious secretions/regurgitation. In looking back at the records, he had had a food impaction in 2009 that was treated by Dr. Duncan Dull fields. She had recommended dilatation of an esophageal ring but patient never followed up. GI was consulted and found a distal stricture, esophagitis and gastritis. History chair was dilated without incident. He is now tolerating a diet. He has an occasional cough and most likely continues to have reflux problems. I've increased his Prevacid to twice daily per the recommendations of GI indefinitely. I've also stopped his Celebrex, as it may of contributed to both his heart failure and gastrointestinal symptoms. His other medical problems remained stable during hospitalization. Family is anxious to have him transferred back to skilled nursing facility for continued rehabilitation. Total time on the day of discharge is greater than 30 minutes.  Discharge Exam:  Blood pressure 132/64, pulse 93, temperature 97.3 F (36.3 C), temperature source Oral, resp. rate 20, height 5\' 9"  (1.753 m), weight 73.483 kg (162 lb), SpO2 98.00%.  Lungs: CTA w/o WRR CV: irreg irreg  Signed: Eliah Ozawa L 09/27/2011, 8:26 AM

## 2011-09-27 NOTE — Progress Notes (Addendum)
Pt d/c today by MD back to [facility]  Pt not alert, but wife and facility are aware and agreeable.  No FL2 needed per Tami at [facility].  Pt will transfer with RN.  D/C summary faxed.   [name]

## 2011-09-28 ENCOUNTER — Telehealth: Payer: Self-pay | Admitting: Gastroenterology

## 2011-09-28 NOTE — Telephone Encounter (Signed)
Called daughter to discuss EGD FINDings: ESOPHAGITIS, STRICTURE, GASTRITIS. PT DOING BETTER. WILL CALL HER WITH BIOPSY RESULTS. She should call [phone] if pt begins to have problems swallowing and we will repeat EGD. NO NEED FOR OPV.

## 2011-09-29 ENCOUNTER — Telehealth: Payer: Self-pay | Admitting: Gastroenterology

## 2011-09-29 LAB — CULTURE, BLOOD (ROUTINE X 2)
Culture: NO GROWTH
Culture: NO GROWTH

## 2011-09-29 LAB — GLUCOSE, CAPILLARY
Glucose-Capillary: 131 mg/dL — ABNORMAL HIGH (ref 70–99)
Glucose-Capillary: 141 mg/dL — ABNORMAL HIGH (ref 70–99)

## 2011-09-29 NOTE — Telephone Encounter (Signed)
Can yo please send a copy of the report to the [facility]. Thanks

## 2011-09-29 NOTE — Telephone Encounter (Signed)
Faxed results to PCP [facility] and 3 month reminder is in the computer

## 2011-09-29 NOTE — Telephone Encounter (Signed)
Please call pt'S DAUGHTER. His stomach Bx shows mild gastritis. Continue PREVACID 30 minutes prior to meals BID.  FAX REPORT TO PT'S FACILITY.

## 2011-09-30 LAB — GLUCOSE, CAPILLARY

## 2011-10-01 LAB — GLUCOSE, CAPILLARY
Glucose-Capillary: 133 mg/dL — ABNORMAL HIGH (ref 70–99)
Glucose-Capillary: 154 mg/dL — ABNORMAL HIGH (ref 70–99)

## 2011-10-02 LAB — GLUCOSE, CAPILLARY
Glucose-Capillary: 112 mg/dL — ABNORMAL HIGH (ref 70–99)
Glucose-Capillary: 129 mg/dL — ABNORMAL HIGH (ref 70–99)

## 2011-10-03 ENCOUNTER — Ambulatory Visit (HOSPITAL_COMMUNITY)
Admission: EM | Admit: 2011-10-03 | Discharge: 2011-10-03 | Disposition: A | Discharge status: Skilled Nursing Facility | Payer: Medicare Other | Attending: Internal Medicine | Admitting: Internal Medicine

## 2011-10-03 ENCOUNTER — Encounter (HOSPITAL_COMMUNITY): Payer: Self-pay | Admitting: *Deleted

## 2011-10-03 ENCOUNTER — Encounter (HOSPITAL_COMMUNITY): Payer: Self-pay

## 2011-10-03 ENCOUNTER — Emergency Department (HOSPITAL_COMMUNITY)
Admission: EM | Admit: 2011-10-03 | Discharge: 2011-10-03 | Disposition: A | Discharge status: Skilled Nursing Facility | Payer: Medicare Other | Attending: Emergency Medicine | Admitting: Emergency Medicine

## 2011-10-03 ENCOUNTER — Emergency Department (HOSPITAL_COMMUNITY): Payer: Medicare Other

## 2011-10-03 ENCOUNTER — Inpatient Hospital Stay
Admission: RE | Admit: 2011-10-03 | Discharge: 2011-11-17 | Disposition: A | Discharge status: Home / Self Care | Payer: Medicare Other | Source: Ambulatory Visit | Attending: Internal Medicine | Admitting: Internal Medicine

## 2011-10-03 ENCOUNTER — Encounter (HOSPITAL_COMMUNITY)
Admission: EM | Disposition: A | Discharge status: Skilled Nursing Facility | Payer: Self-pay | Source: Home / Self Care | Attending: Emergency Medicine

## 2011-10-03 DIAGNOSIS — M5137 Other intervertebral disc degeneration, lumbosacral region: Secondary | ICD-10-CM | POA: Insufficient documentation

## 2011-10-03 DIAGNOSIS — G4733 Obstructive sleep apnea (adult) (pediatric): Secondary | ICD-10-CM | POA: Insufficient documentation

## 2011-10-03 DIAGNOSIS — I2510 Atherosclerotic heart disease of native coronary artery without angina pectoris: Secondary | ICD-10-CM | POA: Insufficient documentation

## 2011-10-03 DIAGNOSIS — Z85038 Personal history of other malignant neoplasm of large intestine: Secondary | ICD-10-CM | POA: Insufficient documentation

## 2011-10-03 DIAGNOSIS — J40 Bronchitis, not specified as acute or chronic: Secondary | ICD-10-CM | POA: Insufficient documentation

## 2011-10-03 DIAGNOSIS — K208 Other esophagitis: Secondary | ICD-10-CM

## 2011-10-03 DIAGNOSIS — Z794 Long term (current) use of insulin: Secondary | ICD-10-CM | POA: Insufficient documentation

## 2011-10-03 DIAGNOSIS — K449 Diaphragmatic hernia without obstruction or gangrene: Secondary | ICD-10-CM

## 2011-10-03 DIAGNOSIS — K222 Esophageal obstruction: Secondary | ICD-10-CM | POA: Insufficient documentation

## 2011-10-03 DIAGNOSIS — K228 Other specified diseases of esophagus: Secondary | ICD-10-CM

## 2011-10-03 DIAGNOSIS — I359 Nonrheumatic aortic valve disorder, unspecified: Secondary | ICD-10-CM | POA: Insufficient documentation

## 2011-10-03 DIAGNOSIS — I10 Essential (primary) hypertension: Secondary | ICD-10-CM | POA: Insufficient documentation

## 2011-10-03 DIAGNOSIS — T18108A Unspecified foreign body in esophagus causing other injury, initial encounter: Secondary | ICD-10-CM

## 2011-10-03 DIAGNOSIS — J4489 Other specified chronic obstructive pulmonary disease: Secondary | ICD-10-CM | POA: Insufficient documentation

## 2011-10-03 DIAGNOSIS — E785 Hyperlipidemia, unspecified: Secondary | ICD-10-CM | POA: Insufficient documentation

## 2011-10-03 DIAGNOSIS — J449 Chronic obstructive pulmonary disease, unspecified: Secondary | ICD-10-CM

## 2011-10-03 DIAGNOSIS — Z79899 Other long term (current) drug therapy: Secondary | ICD-10-CM | POA: Insufficient documentation

## 2011-10-03 DIAGNOSIS — Z860100 Personal history of colon polyps, unspecified: Secondary | ICD-10-CM | POA: Insufficient documentation

## 2011-10-03 DIAGNOSIS — Z7982 Long term (current) use of aspirin: Secondary | ICD-10-CM | POA: Insufficient documentation

## 2011-10-03 DIAGNOSIS — I739 Peripheral vascular disease, unspecified: Secondary | ICD-10-CM | POA: Insufficient documentation

## 2011-10-03 DIAGNOSIS — M549 Dorsalgia, unspecified: Principal | ICD-10-CM

## 2011-10-03 DIAGNOSIS — Z951 Presence of aortocoronary bypass graft: Secondary | ICD-10-CM | POA: Insufficient documentation

## 2011-10-03 DIAGNOSIS — IMO0002 Reserved for concepts with insufficient information to code with codable children: Secondary | ICD-10-CM | POA: Insufficient documentation

## 2011-10-03 DIAGNOSIS — E119 Type 2 diabetes mellitus without complications: Secondary | ICD-10-CM | POA: Insufficient documentation

## 2011-10-03 DIAGNOSIS — M51379 Other intervertebral disc degeneration, lumbosacral region without mention of lumbar back pain or lower extremity pain: Secondary | ICD-10-CM | POA: Insufficient documentation

## 2011-10-03 DIAGNOSIS — I4891 Unspecified atrial fibrillation: Secondary | ICD-10-CM | POA: Insufficient documentation

## 2011-10-03 DIAGNOSIS — J61 Pneumoconiosis due to asbestos and other mineral fibers: Secondary | ICD-10-CM | POA: Insufficient documentation

## 2011-10-03 DIAGNOSIS — Z01812 Encounter for preprocedural laboratory examination: Secondary | ICD-10-CM | POA: Insufficient documentation

## 2011-10-03 DIAGNOSIS — R1310 Dysphagia, unspecified: Secondary | ICD-10-CM | POA: Insufficient documentation

## 2011-10-03 DIAGNOSIS — I509 Heart failure, unspecified: Secondary | ICD-10-CM | POA: Insufficient documentation

## 2011-10-03 LAB — DIFFERENTIAL
Basophils Absolute: 0 10*3/uL (ref 0.0–0.1)
Basophils Relative: 0 % (ref 0–1)
Eosinophils Relative: 4 % (ref 0–5)
Monocytes Absolute: 0.9 10*3/uL (ref 0.1–1.0)
Neutro Abs: 6.3 10*3/uL (ref 1.7–7.7)

## 2011-10-03 LAB — BASIC METABOLIC PANEL
BUN: 20 mg/dL (ref 6–23)
Calcium: 9.8 mg/dL (ref 8.4–10.5)
Creatinine, Ser: 0.73 mg/dL (ref 0.50–1.35)
GFR calc Af Amer: >90 mL/min (ref >90)
GFR calc Af Amer: >90 mL/min (ref >90)
GFR calc non Af Amer: 81 mL/min — ABNORMAL LOW (ref >90)
GFR calc non Af Amer: 79 mL/min — ABNORMAL LOW (ref >90)
Potassium: 4 mEq/L (ref 3.5–5.1)
Sodium: 139 mEq/L (ref 135–145)

## 2011-10-03 LAB — CBC
HCT: 38.8 % — ABNORMAL LOW (ref 39.0–52.0)
MCHC: 33.5 g/dL (ref 30.0–36.0)
MCHC: 32.6 g/dL (ref 30.0–36.0)
MCV: 90.7 fL (ref 78.0–100.0)
RDW: 14.9 % (ref 11.5–15.5)
RDW: 14.9 % (ref 11.5–15.5)

## 2011-10-03 LAB — PROTIME-INR: INR: 1.04 (ref 0.00–1.49)

## 2011-10-03 SURGERY — ESOPHAGOGASTRODUODENOSCOPY (EGD) WITH ESOPHAGEAL DILATION
Anesthesia: Moderate Sedation

## 2011-10-03 MED ORDER — BUTAMBEN-TETRACAINE-BENZOCAINE 2-2-14 % EX AERO
INHALATION_SPRAY | CUTANEOUS | Status: DC | PRN
  Administered 2011-10-03: 2 via TOPICAL

## 2011-10-03 MED ORDER — SODIUM CHLORIDE 0.9 % IV SOLN
INTRAVENOUS | Status: DC
  Administered 2011-10-03: 20:00:00 via INTRAVENOUS

## 2011-10-03 MED ORDER — AZITHROMYCIN 250 MG PO TABS: 250.0000 mg | ORAL_TABLET | Freq: Every day | ORAL | Status: AC

## 2011-10-03 MED ORDER — AZITHROMYCIN 250 MG PO TABS
500.0000 mg | ORAL_TABLET | Freq: Once | ORAL | Status: AC
  Administered 2011-10-03: 500 mg via ORAL
  Filled 2011-10-03: qty 2

## 2011-10-03 MED ORDER — MIDAZOLAM HCL 5 MG/5ML IJ SOLN
INTRAMUSCULAR | Status: DC | PRN
  Administered 2011-10-03: 2 mg via INTRAVENOUS

## 2011-10-03 MED ORDER — SODIUM CHLORIDE 0.9 % IV SOLN: 50.0000 mL | INTRAVENOUS | Status: DC

## 2011-10-03 MED ORDER — CEFTRIAXONE SODIUM 1 G IJ SOLR
1.0000 g | Freq: Once | INTRAMUSCULAR | Status: AC
  Administered 2011-10-03: 1 g via INTRAMUSCULAR
  Filled 2011-10-03: qty 10

## 2011-10-03 MED ORDER — CEFTRIAXONE SODIUM 1 G IJ SOLR: 1.0000 g | Freq: Once | INTRAMUSCULAR | Status: DC

## 2011-10-03 MED ORDER — ONDANSETRON HCL 4 MG PO TABS: ORAL_TABLET | ORAL | Status: DC

## 2011-10-03 MED ORDER — METHYLPREDNISOLONE SODIUM SUCC 125 MG IJ SOLR
125.0000 mg | Freq: Once | INTRAMUSCULAR | Status: AC
  Administered 2011-10-03: 125 mg via INTRAMUSCULAR
  Filled 2011-10-03: qty 2

## 2011-10-03 MED ORDER — DEXTROSE 5 % IV SOLN
1.0000 g | Freq: Once | INTRAVENOUS | Status: AC
  Administered 2011-10-03: 1 g via INTRAVENOUS

## 2011-10-03 MED ORDER — STERILE WATER FOR IRRIGATION IR SOLN
Status: DC | PRN
  Administered 2011-10-03: 20:00:00

## 2011-10-03 MED ORDER — LIDOCAINE HCL (PF) 1 % IJ SOLN
INTRAMUSCULAR | Status: AC
  Administered 2011-10-03: 5 mL
  Filled 2011-10-03: qty 5

## 2011-10-03 MED ORDER — DEXTROSE 5 % IV SOLN
INTRAVENOUS | Status: AC
  Filled 2011-10-03: qty 10

## 2011-10-03 NOTE — ED Notes (Signed)
Patient with noted cough. Patient spitting up clear colored saliva occasionally. Swallowing repeatedly. NAD noted at this time. Son at bedside. Awaiting EDP assessment.

## 2011-10-03 NOTE — ED Notes (Signed)
Notified MD via telephone of patient's arrival to ED.

## 2011-10-03 NOTE — Op Note (Signed)
EGD PROCEDURE REPORT  PATIENT:  [name]  MR#:  [id number] Birthdate:  March 31, 1922, 78 y.o., male Endoscopist:  Dr. Malissa Hippo, MD Referred By:  Dr. Mauricio Po, MD Procedure Date: 10/03/2011  Procedure:   EGD with foreign body removal and esophageal dilation.  Indications:  The patient is a 78 year old Caucasian male who has history of esophageal stricture and underwent esophageal dilation by Dr. Darrick Penna one week ago. His stricture was dilated to 16 mm with Savary dilators. He now presents with  signs and symptoms of dysphagia and food impaction. Patient was evaluated in emergency room and sent over for EGD.            Informed Consent:  Procedure and risks were reviewed with the patient's son Mr. [name] informed consent was obtained.  Medications:  Versed 2 mg IV Cetacaine spray topically for oropharyngeal anesthesia  Description of procedure:  Patient was placed in left lateral recumbent position his vital signs and O2 sat were monitored during the procedure. His baseline rhythm was atrial fibrillation with ventricular rate of wound 100. Entex he does go was passed oropharynx without difficulty esophagus there he had air fluid level implying distal obstruction. Most of the fluid and some pieces of food debris was suctioned out and I was able to pass the scope across the stricture at GE junction into the stomach. Rest of the food debris was easily passed into the stomach. Examination was then completed, esophageal dilation was performed and endoscope was withdrawn  Findings:  Esophagus:  Mucosa of the proximal and middle segment was normal. Foreign body at distal esophagus with air-fluid level. Foreign body was dislodged as above. Stricture noted at GE junction with erosion just proximal to it. This is a fascial mucosa was also edematous. Sufficient stricture was dilated with a balloon initially to 15 mm and subsequently to 16.5 and 18 mm. GEJ:  40 cm Hiatus:  37 cm. single 5-6  mm erosion noted at the level of hiatus Stomach:  Other than food debris which had been pushed from the esophagus stomach was empty and distended very well with insufflation. Few antral erosions were noted. Pyloric channel was patent the aneurysm fundus and cardia examined by retroflexing the scope in the unremarkable. Duodenum:  Normal bulbar and post bulbar mucosa.  Therapeutic/Diagnostic Maneuvers Performed:  Sufficient foreign body was removed as described above and distal esophagus dilated with balloon dilator to a diameter of 18 mm as above. No mucosal disruption induced.  Complications:  None  Impression: Foreign body in distal esophagus which is removed as above. Erosive reflux esophagitis with stricture at GE junction. Stricture was dilated to 18 mm with a balloon. Small sliding hiatal hernia. Single large erosion at the level of hiatus along with antral erosions. Suspect patient also has esophageal motility disorder.   Recommendations:  Patient was given a gram of Rocephin IV . Please note he was given initial dose in ER  this am for bronchitis. Chest x-ray this evening is negative for infiltrate. Leave IV at 50 mL per hour tonight. Pured diet. Continue anti-reflux measures and PPI as before [name] U  10/03/2011  9:10 PM  CC: Dr. Terald Sleeper, MD, MD          Dr. Raj Janus, MD

## 2011-10-03 NOTE — ED Notes (Signed)
Patient complaining of back pain. Placed two pillows behind back. Patient stated "it feels better."

## 2011-10-03 NOTE — ED Notes (Signed)
Pt from the [facility] being treated for nausea and vomiting and tonight with increased cough and "gurgling" sounds in throat and chest.  Sent for rule out aspiration pneumonia

## 2011-10-03 NOTE — H&P (Signed)
[name] is an 78 y.o. male.   Chief Complaint: Patient is undergoing EGD and possible foreign body removal. HPI: Patient is 78 year old Caucasian male who has history of esophageal stricture he underwent esophagogastroduodenoscopy with esophageal dilation by Dr. Darrick Penna on 09/25/2013. He was also noted to have erosive esophagitis. The stricture was dilated from 12.8-16 mm with Savary dilators over a guidewire. Patient did fine until last night when he was brought to the emergency room for what is reported to be vomiting and coughing. He was seen by emergency room physician this morning and discharge. He was brought back this evening and felt to have sufficient foreign body is he could not swallow liquids. According to his son he had CVA in October 2012 but his oropharyngeal function has returned. Patient's INR is 1.04  Past Medical History  Diagnosis Date  . Encounter for long-term (current) use of anticoagulants 12/09/2010  . Hypoxemia 05/15/2009  . Atrial fibrillation 01/19/2009  . CORONARY ARTERY DISEASE 10/07/2007  . AORTIC STENOSIS, SEVERE 10/07/2007  . PVD 10/07/2007  . HYPERLIPIDEMIA 10/07/2007  . HYPERTENSION 10/07/2007  . CEREBROVASCULAR DISEASE 10/07/2007  . DYSPNEA 02/14/2008  . OBSTRUCTIVE SLEEP APNEA 10/07/2007  . Asbestosis 02/21/2008  . History of colon cancer   . DDD (degenerative disc disease)     low back  . Colon polyp   . Pneumonia   . Respiratory failure   . Leukocytosis   . Thrombocytopenia   . Dehydration   . Dementia   . CHF (congestive heart failure)   . Esophageal stricture     multiple dilation    Past Surgical History  Procedure Date  . Abdominal aortic aneurysm repair 1992  . Coronary artery bypass graft 2005  . Aortic valve replacement 2005  . Knee surgery     left  . Eye surgery   . Esophagogastroduodenoscopy 4/09    Dr. Rosealee Albee food bolus, distal esophageal ring narrowed esophagus to 11-65mm. Patient was on coumadin for no dilation. Supposed to come  back but did not.  . Aorto-femoral bypass graft     Family History  Problem Relation Age of Onset  . COPD    . Colon cancer      1/2 sister  . Lung cancer    . Heart disease    . Coronary artery disease    . Stroke Mother 78  . Heart disease Father    Social History:  reports that he quit smoking about 37 years ago. His smoking use included Cigarettes. He has a 20 pack-year smoking history. He has never used smokeless tobacco. He reports that he does not drink alcohol or use illicit drugs.  Allergies:  Allergies  Allergen Reactions  . Morphine Nausea And Vomiting  . Ultram (Tramadol Hcl) Other (See Comments)    Made "wild"    Medications Prior to Admission  Medication Dose Route Frequency Provider Last Rate Last Dose  . 0.9 %  sodium chloride infusion   Intravenous Continuous Doug Sou, MD      . azithromycin (ZITHROMAX) tablet 500 mg  500 mg Oral Once Benny Lennert, MD   500 mg at 10/03/11 0609  . cefTRIAXone (ROCEPHIN) injection 1 g  1 g Intramuscular Once Benny Lennert, MD   1 g at 10/03/11 5409  . lidocaine (XYLOCAINE) 1 % injection        5 mL at 10/03/11 0625  . methylPREDNISolone sodium succinate (SOLU-MEDROL) 125 MG injection 125 mg  125 mg Intramuscular Once Jomarie Longs  Purnell Shoemaker, MD   125 mg at 10/03/11 0454   Medications Prior to Admission  Medication Sig Dispense Refill  . acetaminophen (TYLENOL) 500 MG tablet Take 1,000 mg by mouth 2 (two) times daily.      Marland Kitchen arformoterol (BROVANA) 15 MCG/2ML NEBU Take 15 mcg by nebulization 2 (two) times daily.       Marland Kitchen aspirin 81 MG EC tablet Take 81 mg by mouth daily.        . budesonide (PULMICORT) 0.25 MG/2ML nebulizer solution Take 0.25 mg by nebulization 2 (two) times daily. RINSE MOUTH AFTER EACH USE      . clopidogrel (PLAVIX) 75 MG tablet Take 75 mg by mouth daily with breakfast.        . feeding supplement (ENSURE CLINICAL STRENGTH) LIQD Take 237 mLs by mouth 2 (two) times daily between meals.      . furosemide  (LASIX) 40 MG tablet Take 1 tablet (40 mg total) by mouth daily.  30 tablet    . lansoprazole (PREVACID) 30 MG capsule Take 1 capsule (30 mg total) by mouth 2 (two) times daily.      . Multiple Vitamin (MULTIVITAMIN) tablet Take 1 tablet by mouth daily.       Marland Kitchen tiotropium (SPIRIVA HANDIHALER) 18 MCG inhalation capsule Place 18 mcg into inhaler and inhale daily.      Marland Kitchen acetaminophen (TYLENOL) 500 MG tablet Take 500 mg by mouth every 6 (six) hours as needed. Only one additional dose if needed with the scheduled dose      . albuterol (PROVENTIL) (2.5 MG/3ML) 0.083% nebulizer solution Take 2.5 mg by nebulization every 6 (six) hours as needed. For wheezing/ shortness of breath      . donepezil (ARICEPT) 5 MG tablet Take 5 mg by mouth at bedtime.       Marland Kitchen guaiFENesin (MUCINEX) 600 MG 12 hr tablet Take 1 tablet (600 mg total) by mouth 2 (two) times daily as needed.      . insulin aspart (NOVOLOG) 100 UNIT/ML injection Inject 0-9 Units into the skin 3 (three) times daily with meals.  1 vial  0  . pravastatin (PRAVACHOL) 40 MG tablet Take 40 mg by mouth at bedtime.         Results for orders placed during the hospital encounter of 10/03/11 (from the past 48 hour(s))  CBC     Status: Abnormal   Collection Time   10/03/11  7:11 PM      Component Value Range Comment   WBC 21.7 (*) 4.0 - 10.5 (K/uL)    RBC 4.44  4.22 - 5.81 (MIL/uL)    Hemoglobin 13.3  13.0 - 17.0 (g/dL)    HCT 09.8  11.9 - 14.7 (%)    MCV 91.9  78.0 - 100.0 (fL)    MCH 30.0  26.0 - 34.0 (pg)    MCHC 32.6  30.0 - 36.0 (g/dL)    RDW 82.9  56.2 - 13.0 (%)    Platelets 322  150 - 400 (K/uL)   BASIC METABOLIC PANEL     Status: Abnormal   Collection Time   10/03/11  7:11 PM      Component Value Range Comment   Sodium 139  135 - 145 (mEq/L)    Potassium 4.0  3.5 - 5.1 (mEq/L)    Chloride 102  96 - 112 (mEq/L)    CO2 26  19 - 32 (mEq/L)    Glucose, Bld 163 (*) 70 - 99 (mg/dL)  BUN 23  6 - 23 (mg/dL)    Creatinine, Ser 1.61  0.50 - 1.35  (mg/dL)    Calcium 09.6  8.4 - 10.5 (mg/dL)    GFR calc non Af Amer 79 (*) >90 (mL/min)    GFR calc Af Amer >90  >90 (mL/min)   PROTIME-INR     Status: Normal   Collection Time   10/03/11  7:20 PM      Component Value Range Comment   Prothrombin Time 13.8  11.6 - 15.2 (seconds)    INR 1.04  0.00 - 1.49     Dg Chest Port 1 View  10/03/2011  *RADIOLOGY REPORT*  Clinical Data: Dysphagia.  Coronary artery disease.  PORTABLE CHEST - 1 VIEW  Comparison: 10/03/2011 and 08/19/2011  Findings: Moderate cardiomegaly stable.  Chronic interstitial lung disease is unchanged.  Right pleural thickening is also stable. There is no evidence of acute infiltrate or pleural effusion. Patient has undergone previous CABG and aortic valve replacement.  IMPRESSION: Stable cardiomegaly, chronic interstitial lung disease, and right pleural thickening.  No acute findings.  Original Report Authenticated By: Danae Orleans, M.D.   Dg Chest Portable 1 View  10/03/2011  *RADIOLOGY REPORT*  Clinical Data: Nausea, emesis, cough.  PORTABLE CHEST - 1 VIEW  Comparison: 09/27/2011  Findings: Cardiac enlargement.  Chronic interstitial changes throughout the lungs.  No definite airspace consolidation. Pleural thickening along the right lateral chest wall.  No blunting of costophrenic angles.  No pneumothorax.  Postoperative changes in the mediastinum.  Stable appearance since previous study.  IMPRESSION: Cardiac enlargement.  Diffuse interstitial fibrosis in the lungs with right lateral pleural thickening.  Stable appearance since previous study.  Original Report Authenticated By: Marlon Pel, M.D.    Review of Systems  Constitutional: Negative for weight loss.  Gastrointestinal: Negative for blood in stool.    Blood pressure 147/95, pulse 105, temperature 99.5 F (37.5 C), temperature source Rectal, resp. rate 20, height 5\' 8"  (1.727 m), weight 158 lb (71.668 kg), SpO2 94.00%. Physical Exam  Constitutional: He appears  well-developed and well-nourished.  HENT:  Mouth/Throat: Oropharynx is clear and moist.  Eyes: Conjunctivae are normal. No scleral icterus.  Neck: No thyromegaly present.  Cardiovascular:  No murmur heard.       Irregular rhythm normal S1 and S2 no murmur noted  Respiratory: Effort normal and breath sounds normal.  GI: Soft. There is no tenderness. There is no rebound.  Musculoskeletal: He exhibits no edema.  Lymphadenopathy:    He has no cervical adenopathy.  Neurological: He is alert.  Skin: Skin is warm and dry.     Assessment/Plan Possible esophageal food impaction. EGD with FB removal, possible esophageal dilation  Dellar Traber U 10/03/2011, 8:21 PM

## 2011-10-03 NOTE — ED Notes (Signed)
Sent from [facility] for possible esophageal stricture/ GI consult. "All liquids are coming back up". Pt was seen here early this morning also. Recently had esophagus stretched 2 weeks ago which was then, "almost completley closed" per son. Unable to swallow saliva. Also has had a bad cough and was put on antibiotics last night.

## 2011-10-03 NOTE — ED Provider Notes (Signed)
History     CSN: [id number]  Arrival date & time 10/03/11  1807   First MD Initiated Contact with Patient 10/03/11 1818      Chief Complaint  Patient presents with  . Dysphagia   Level V caveat patient demented history is obtained from patient's son, from nursing home records and from trish, at [facility] (Consider location/radiation/quality/duration/timing/severity/associated sxs/prior treatment) HPI Patient has been unable to eat or drink anything since yesterday afternoon without vomiting immediately after swallowing. He is unable to swallow his own saliva. No treatment prior to coming here. Had GI consultation with Dr. Kendell Bane 09/23/2010 EGD recommended seen here last night for similar complaint also with coughfor several days Past Medical History  Diagnosis Date  . Encounter for long-term (current) use of anticoagulants 12/09/2010  . Hypoxemia 05/15/2009  . Atrial fibrillation 01/19/2009  . CORONARY ARTERY DISEASE 10/07/2007  . AORTIC STENOSIS, SEVERE 10/07/2007  . PVD 10/07/2007  . HYPERLIPIDEMIA 10/07/2007  . HYPERTENSION 10/07/2007  . CEREBROVASCULAR DISEASE 10/07/2007  . DYSPNEA 02/14/2008  . OBSTRUCTIVE SLEEP APNEA 10/07/2007  . Asbestosis 02/21/2008  . History of colon cancer   . DDD (degenerative disc disease)     low back  . Colon polyp   . Pneumonia   . Respiratory failure   . Leukocytosis   . Thrombocytopenia   . Dehydration   . Dementia   . CHF (congestive heart failure)   . Esophageal stricture     multiple dilation    Past Surgical History  Procedure Date  . Abdominal aortic aneurysm repair 1992  . Coronary artery bypass graft 2005  . Aortic valve replacement 2005  . Knee surgery     left  . Eye surgery   . Esophagogastroduodenoscopy 4/09    Dr. Rosealee Albee food bolus, distal esophageal ring narrowed esophagus to 11-65mm. Patient was on coumadin for no dilation. Supposed to come back but did not.  . Aorto-femoral bypass graft     Family History  Problem  Relation Age of Onset  . COPD    . Colon cancer      1/2 sister  . Lung cancer    . Heart disease    . Coronary artery disease    . Stroke Mother 63  . Heart disease Father     History  Substance Use Topics  . Smoking status: Former Smoker -- 1.0 packs/day for 20 years    Types: Cigarettes    Quit date: 12/16/1973  . Smokeless tobacco: Never Used  . Alcohol Use: No      Review of Systems  Unable to perform ROS: Dementia    Allergies  Morphine and Ultram  Home Medications   Current Outpatient Rx  Name Route Sig Dispense Refill  . ACETAMINOPHEN 500 MG PO TABS Oral Take 1,000 mg by mouth 2 (two) times daily.    . ACETAMINOPHEN 500 MG PO TABS Oral Take 500 mg by mouth every 6 (six) hours as needed. Only one additional dose if needed with the scheduled dose    . ALBUTEROL SULFATE (2.5 MG/3ML) 0.083% IN NEBU Nebulization Take 2.5 mg by nebulization every 6 (six) hours as needed. For wheezing/ shortness of breath    . ARFORMOTEROL TARTRATE 15 MCG/2ML IN NEBU Nebulization Take 15 mcg by nebulization 2 (two) times daily.     . ASPIRIN 81 MG PO TBEC Oral Take 81 mg by mouth daily.      . AZITHROMYCIN 250 MG PO TABS Oral Take 1 tablet (250 mg  total) by mouth daily. 4 tablet 0  . BUDESONIDE 0.25 MG/2ML IN SUSP Nebulization Take 0.25 mg by nebulization 2 (two) times daily.      Marland Kitchen CLOPIDOGREL BISULFATE 75 MG PO TABS Oral Take 75 mg by mouth daily with breakfast.      . DONEPEZIL HCL 5 MG PO TABS Oral Take 5 mg by mouth daily.      Marland Kitchen ENSURE CLINICAL ST REVIGOR PO LIQD Oral Take 237 mLs by mouth 2 (two) times daily between meals.    . FUROSEMIDE 40 MG PO TABS Oral Take 1 tablet (40 mg total) by mouth daily. 30 tablet   . GUAIFENESIN ER 600 MG PO TB12 Oral Take 1 tablet (600 mg total) by mouth 2 (two) times daily as needed.    . INSULIN ASPART 100 UNIT/ML [place] SOLN Subcutaneous Inject 0-9 Units into the skin 3 (three) times daily with meals. 1 vial 0  . LANSOPRAZOLE 30 MG PO CPDR Oral Take  1 capsule (30 mg total) by mouth 2 (two) times daily.    Marland Kitchen ONE-DAILY MULTI VITAMINS PO TABS Oral Take 1 tablet by mouth daily.     Marland Kitchen ONDANSETRON HCL 4 MG PO TABS Oral Take 4 mg by mouth every 8 (eight) hours as needed.    Marland Kitchen ONDANSETRON HCL 4 MG PO TABS  One every 6 hours for nauseau 20 tablet 0  . PRAVASTATIN SODIUM 40 MG PO TABS Oral Take 40 mg by mouth at bedtime.     Marland Kitchen TIOTROPIUM BROMIDE MONOHYDRATE 18 MCG IN CAPS Inhalation Place 18 mcg into inhaler and inhale daily.      BP 148/83  Pulse 108  Temp(Src) 98.3 F (36.8 C) (Oral)  Resp 25  Ht 5\' 8"  (1.727 m)  Wt 158 lb (71.668 kg)  BMI 24.02 kg/m2  SpO2 97%  Physical Exam  Nursing note and vitals reviewed. Constitutional:       Chronically ill-appearing spitting out his saliva no respiratory distress.  HENT:  Head: Normocephalic and atraumatic.  Eyes: EOM are normal.  Neck: Neck supple.  Cardiovascular: Normal rate.   Pulmonary/Chest:       Rales right base  Abdominal: Soft. There is tenderness.  Neurological: He is alert.  Psychiatric:       Pleasantly confused    ED Course  Procedures (including critical care time)  Labs Reviewed - No data to display Dg Chest Portable 1 View  10/03/2011  *RADIOLOGY REPORT*  Clinical Data: Nausea, emesis, cough.  PORTABLE CHEST - 1 VIEW  Comparison: 09/27/2011  Findings: Cardiac enlargement.  Chronic interstitial changes throughout the lungs.  No definite airspace consolidation. Pleural thickening along the right lateral chest wall.  No blunting of costophrenic angles.  No pneumothorax.  Postoperative changes in the mediastinum.  Stable appearance since previous study.  IMPRESSION: Cardiac enlargement.  Diffuse interstitial fibrosis in the lungs with right lateral pleural thickening.  Stable appearance since previous study.  Original Report Authenticated By: Marlon Pel, M.D.     No diagnosis found. Spoke with Dr. Karilyn Cota , who will takept to endoscopy suite  Results for orders  placed during the hospital encounter of 10/03/11  CBC      Component Value Range   WBC 21.7 (*) 4.0 - 10.5 (K/uL)   RBC 4.44  4.22 - 5.81 (MIL/uL)   Hemoglobin 13.3  13.0 - 17.0 (g/dL)   HCT 16.1  09.6 - 04.5 (%)   MCV 91.9  78.0 - 100.0 (fL)  MCH 30.0  26.0 - 34.0 (pg)   MCHC 32.6  30.0 - 36.0 (g/dL)   RDW 45.4  09.8 - 11.9 (%)   Platelets 322  150 - 400 (K/uL)  BASIC METABOLIC PANEL      Component Value Range   Sodium 139  135 - 145 (mEq/L)   Potassium 4.0  3.5 - 5.1 (mEq/L)   Chloride 102  96 - 112 (mEq/L)   CO2 26  19 - 32 (mEq/L)   Glucose, Bld 163 (*) 70 - 99 (mg/dL)   BUN 23  6 - 23 (mg/dL)   Creatinine, Ser 1.47  0.50 - 1.35 (mg/dL)   Calcium 82.9  8.4 - 10.5 (mg/dL)   GFR calc non Af Amer 79 (*) >90 (mL/min)   GFR calc Af Amer >90  >90 (mL/min)  PROTIME-INR      Component Value Range   Prothrombin Time 13.8  11.6 - 15.2 (seconds)   INR 1.04  0.00 - 1.49    Dg Chest 1 View  09/15/2011  *RADIOLOGY REPORT*  Clinical Data: Pneumonia  CHEST - 1 VIEW  Comparison: 08/19/2011  Findings: Previous median sternotomy, CABG and aortic valve replacement.  Cardiomegaly persists.  There is chronic pleural thickening on the right.  Chronic calcific pleural plaques remain evident bilaterally.  Chronic pulmonary scarring remains evident. Markings appear slightly more prominent in the lung bases.  This could represent progressive scarring or minimal patchy basilar pneumonia.  No dense consolidation or major collapse.  IMPRESSION: Extensive chronic pleural and parenchymal findings as above. Question increasingly prominent patchy density at the lung bases could be progressive scarring or mild patchy basilar pneumonia.  Original Report Authenticated By: Thomasenia Sales, M.D.   Dg Chest Port 1 View  10/03/2011  *RADIOLOGY REPORT*  Clinical Data: Dysphagia.  Coronary artery disease.  PORTABLE CHEST - 1 VIEW  Comparison: 10/03/2011 and 08/19/2011  Findings: Moderate cardiomegaly stable.  Chronic  interstitial lung disease is unchanged.  Right pleural thickening is also stable. There is no evidence of acute infiltrate or pleural effusion. Patient has undergone previous CABG and aortic valve replacement.  IMPRESSION: Stable cardiomegaly, chronic interstitial lung disease, and right pleural thickening.  No acute findings.  Original Report Authenticated By: Danae Orleans, M.D.   Dg Chest Portable 1 View  10/03/2011  *RADIOLOGY REPORT*  Clinical Data: Nausea, emesis, cough.  PORTABLE CHEST - 1 VIEW  Comparison: 09/27/2011  Findings: Cardiac enlargement.  Chronic interstitial changes throughout the lungs.  No definite airspace consolidation. Pleural thickening along the right lateral chest wall.  No blunting of costophrenic angles.  No pneumothorax.  Postoperative changes in the mediastinum.  Stable appearance since previous study.  IMPRESSION: Cardiac enlargement.  Diffuse interstitial fibrosis in the lungs with right lateral pleural thickening.  Stable appearance since previous study.  Original Report Authenticated By: Marlon Pel, M.D.   Dg Chest Port 1 View  09/27/2011  *RADIOLOGY REPORT*  Clinical Data: Follow-up congestive heart failure.  Pneumonia.  PORTABLE CHEST - 1 VIEW  Comparison: 09/25/2011 and 09/24/2011.  Older studies dating back to 02/28/2011 radiographs and 09/11/2006 CT also correlated.  Findings: 0656 hours. There is chronic pleural disease with pleural thickening on the right and bilateral pleural calcifications. Superimposed interstitial prominence has improved.  There is mild residual volume loss in the right hemithorax.  The heart size and mediastinal contours are stable status post CABG and aortic valve replacement.  No significant pleural effusion is seen.  IMPRESSION:  1.  Improved  interstitial prominence superimposed on chronic pleural parenchymal opacities. 2.  Current appearance has almost returned to baseline.  Original Report Authenticated By: Gerrianne Scale, M.D.    Dg Chest Port 1 View  09/25/2011  *RADIOLOGY REPORT*  Clinical Data: CHF, cough  PORTABLE CHEST - 1 VIEW  Comparison: Portable exam 1444 hours compared to 09/24/2011  Findings: Enlargement of cardiac silhouette post CABG. Tortuous aorta with atherosclerotic calcification. Pulmonary vascular congestion. Diffuse interstitial infiltrates compatible with pulmonary edema and CHF. Small right pleural effusion. No pneumothorax.  IMPRESSION: CHF, little changed.  Original Report Authenticated By: Lollie Marrow, M.D.   Dg Chest Portable 1 View  09/24/2011  *RADIOLOGY REPORT*  Clinical Data: Cough and chest congestion.  PORTABLE CHEST - 1 VIEW  Comparison: None.  Findings: The patient has developed mild bilateral pulmonary edema. There is chronic cardiomegaly.  Pulmonary vascularity is slightly more prominent than on the prior study.  Chronic pleural thickening and calcification bilaterally.  IMPRESSION: Interval development of mild bilateral pulmonary edema.  Original Report Authenticated By: Gwynn Burly, M.D.    MDM  Symptoms and exam consistent with esophageal stricture or Impaction Diagnosis #1dysphagia #2 hyperglycemia         Doug Sou, MD 10/03/11 2012

## 2011-10-03 NOTE — ED Notes (Signed)
Patient to or for enoscopy

## 2011-10-03 NOTE — ED Notes (Signed)
Patient from [facility], started vomiting yesterday. Complaining of vomiting and cough today.

## 2011-10-03 NOTE — ED Provider Notes (Signed)
History     CSN: [id number]  Arrival date & time 10/03/11  0321   First MD Initiated Contact with Patient 10/03/11 [phone]      Chief Complaint  Patient presents with  . Nausea  . Emesis  . Cough    (Consider location/radiation/quality/duration/timing/severity/associated sxs/prior treatment) Patient is a 78 y.o. male presenting with vomiting and cough. The history is provided by the nursing home (the patient was brought to the emergency room because he vomited a couple times today and he's been coughing up some material for quite some time. He has a history of pulmonary fibrosis from asbestos.). No language interpreter was used.  Emesis  This is a recurrent problem. The current episode started more than 2 days ago. The problem occurs 2 to 4 times per day. The problem has not changed since onset.Vomiting appearance: mostly clear sputum. There has been no fever. Associated symptoms include chills and cough. Pertinent negatives include no abdominal pain, no diarrhea and no headaches.  Cough This is a recurrent problem. The current episode started more than 2 days ago. The problem occurs every few minutes. The problem has not changed since onset.The cough is productive of sputum. There has been no fever. Associated symptoms include chills. Pertinent negatives include no chest pain and no headaches. He has tried nothing for the symptoms. The treatment provided no relief. Risk factors include chemical exposure. He is not a smoker. His past medical history is significant for pneumonia.    Past Medical History  Diagnosis Date  . Encounter for long-term (current) use of anticoagulants 12/09/2010  . Hypoxemia 05/15/2009  . Atrial fibrillation 01/19/2009  . CORONARY ARTERY DISEASE 10/07/2007  . AORTIC STENOSIS, SEVERE 10/07/2007  . PVD 10/07/2007  . HYPERLIPIDEMIA 10/07/2007  . HYPERTENSION 10/07/2007  . CEREBROVASCULAR DISEASE 10/07/2007  . DYSPNEA 02/14/2008  . OBSTRUCTIVE SLEEP APNEA 10/07/2007  . Asbestosis  02/21/2008  . History of colon cancer   . DDD (degenerative disc disease)     low back  . Colon polyp   . Pneumonia   . Respiratory failure   . Leukocytosis   . Thrombocytopenia   . Dehydration   . Dementia   . CHF (congestive heart failure)   . Esophageal stricture     multiple dilation    Past Surgical History  Procedure Date  . Abdominal aortic aneurysm repair 1992  . Coronary artery bypass graft 2005  . Aortic valve replacement 2005  . Knee surgery     left  . Eye surgery   . Esophagogastroduodenoscopy 4/09    Dr. Rosealee Albee food bolus, distal esophageal ring narrowed esophagus to 11-57mm. Patient was on coumadin for no dilation. Supposed to come back but did not.  . Aorto-femoral bypass graft     Family History  Problem Relation Age of Onset  . COPD    . Colon cancer      1/2 sister  . Lung cancer    . Heart disease    . Coronary artery disease    . Stroke Mother 4  . Heart disease Father     History  Substance Use Topics  . Smoking status: Former Smoker -- 1.0 packs/day for 20 years    Types: Cigarettes    Quit date: 12/16/1973  . Smokeless tobacco: Never Used  . Alcohol Use: No      Review of Systems  Constitutional: Positive for chills. Negative for fatigue.  HENT: Negative for congestion, sinus pressure and ear discharge.   Eyes: Negative  for discharge.  Respiratory: Positive for cough.   Cardiovascular: Negative for chest pain.  Gastrointestinal: Positive for vomiting. Negative for abdominal pain and diarrhea.  Genitourinary: Negative for frequency and hematuria.  Musculoskeletal: Negative for back pain.  Skin: Negative for rash.  Neurological: Negative for seizures and headaches.  Hematological: Negative.   Psychiatric/Behavioral: Negative for hallucinations.    Allergies  Morphine and Ultram  Home Medications   Current Outpatient Rx  Name Route Sig Dispense Refill  . ACETAMINOPHEN 500 MG PO TABS Oral Take 1,000 mg by mouth 2  (two) times daily.    . ALBUTEROL SULFATE (2.5 MG/3ML) 0.083% IN NEBU Nebulization Take 2.5 mg by nebulization every 6 (six) hours as needed. For wheezing/ shortness of breath    . ARFORMOTEROL TARTRATE 15 MCG/2ML IN NEBU Nebulization Take 15 mcg by nebulization 2 (two) times daily.     . ASPIRIN 81 MG PO TBEC Oral Take 81 mg by mouth daily.      . BUDESONIDE 0.25 MG/2ML IN SUSP Nebulization Take 0.25 mg by nebulization 2 (two) times daily.      Marland Kitchen CLOPIDOGREL BISULFATE 75 MG PO TABS Oral Take 75 mg by mouth daily with breakfast.      . DONEPEZIL HCL 5 MG PO TABS Oral Take 5 mg by mouth daily.      Marland Kitchen ENSURE CLINICAL ST REVIGOR PO LIQD Oral Take 237 mLs by mouth 2 (two) times daily between meals.    . FUROSEMIDE 40 MG PO TABS Oral Take 1 tablet (40 mg total) by mouth daily. 30 tablet   . GUAIFENESIN ER 600 MG PO TB12 Oral Take 1 tablet (600 mg total) by mouth 2 (two) times daily as needed.    . INSULIN ASPART 100 UNIT/ML [place] SOLN Subcutaneous Inject 0-9 Units into the skin 3 (three) times daily with meals. 1 vial 0  . LANSOPRAZOLE 30 MG PO CPDR Oral Take 1 capsule (30 mg total) by mouth 2 (two) times daily.    Marland Kitchen ONE-DAILY MULTI VITAMINS PO TABS Oral Take 1 tablet by mouth daily.     Marland Kitchen ONDANSETRON HCL 4 MG PO TABS Oral Take 4 mg by mouth every 8 (eight) hours as needed.    Marland Kitchen PRAVASTATIN SODIUM 40 MG PO TABS Oral Take 40 mg by mouth at bedtime.     Marland Kitchen TIOTROPIUM BROMIDE MONOHYDRATE 18 MCG IN CAPS Inhalation Place 18 mcg into inhaler and inhale daily.    . ACETAMINOPHEN 500 MG PO TABS Oral Take 500 mg by mouth every 6 (six) hours as needed. Only one additional dose if needed with the scheduled dose    . AZITHROMYCIN 250 MG PO TABS Oral Take 1 tablet (250 mg total) by mouth daily. 4 tablet 0  . ONDANSETRON HCL 4 MG PO TABS  One every 6 hours for nauseau 20 tablet 0    BP 118/68  Pulse 102  Temp(Src) 98.4 F (36.9 C) (Oral)  Resp 23  Ht 5\' 10"  (1.778 m)  Wt 162 lb (73.483 kg)  BMI 23.24 kg/m2   SpO2 93%  Physical Exam  Constitutional: He is oriented to person, place, and time. He appears well-developed.  HENT:  Head: Normocephalic and atraumatic.  Eyes: Conjunctivae and EOM are normal. No scleral icterus.  Neck: Neck supple. No thyromegaly present.  Cardiovascular: Normal rate and regular rhythm.  Exam reveals no gallop and no friction rub.   No murmur heard. Pulmonary/Chest: No stridor. He has no wheezes. He has rales. He exhibits  no tenderness.       Mild rales in chest  Abdominal: He exhibits no distension. There is no tenderness. There is no rebound.  Musculoskeletal: Normal range of motion. He exhibits no edema.  Lymphadenopathy:    He has no cervical adenopathy.  Neurological: He is oriented to person, place, and time. Coordination normal.  Skin: No rash noted. No erythema.  Psychiatric: He has a normal mood and affect. His behavior is normal.    ED Course  Procedures (including critical care time)  Labs Reviewed  CBC - Abnormal; Notable for the following:    HCT 38.8 (*)    All other components within normal limits  BASIC METABOLIC PANEL - Abnormal; Notable for the following:    Glucose, Bld 133 (*)    GFR calc non Af Amer 81 (*)    All other components within normal limits  DIFFERENTIAL   Dg Chest Portable 1 View  10/03/2011  *RADIOLOGY REPORT*  Clinical Data: Nausea, emesis, cough.  PORTABLE CHEST - 1 VIEW  Comparison: 09/27/2011  Findings: Cardiac enlargement.  Chronic interstitial changes throughout the lungs.  No definite airspace consolidation. Pleural thickening along the right lateral chest wall.  No blunting of costophrenic angles.  No pneumothorax.  Postoperative changes in the mediastinum.  Stable appearance since previous study.  IMPRESSION: Cardiac enlargement.  Diffuse interstitial fibrosis in the lungs with right lateral pleural thickening.  Stable appearance since previous study.  Original Report Authenticated By: Marlon Pel, M.D.     1.  COPD (chronic obstructive pulmonary disease)   2. Bronchitis       MDM  Bronchitis and pulmonary fibrosis        Benny Lennert, MD 10/03/11 [phone]

## 2011-10-06 LAB — GLUCOSE, CAPILLARY
Glucose-Capillary: 181 mg/dL — ABNORMAL HIGH (ref 70–99)
Glucose-Capillary: 180 mg/dL — ABNORMAL HIGH (ref 70–99)

## 2011-10-07 LAB — GLUCOSE, CAPILLARY
Glucose-Capillary: 119 mg/dL — ABNORMAL HIGH (ref 70–99)
Glucose-Capillary: 151 mg/dL — ABNORMAL HIGH (ref 70–99)
Glucose-Capillary: 162 mg/dL — ABNORMAL HIGH (ref 70–99)

## 2011-10-08 LAB — GLUCOSE, CAPILLARY: Glucose-Capillary: 103 mg/dL — ABNORMAL HIGH (ref 70–99)

## 2011-10-09 LAB — GLUCOSE, CAPILLARY
Glucose-Capillary: 119 mg/dL — ABNORMAL HIGH (ref 70–99)
Glucose-Capillary: 179 mg/dL — ABNORMAL HIGH (ref 70–99)

## 2011-10-10 LAB — GLUCOSE, CAPILLARY
Glucose-Capillary: 119 mg/dL — ABNORMAL HIGH (ref 70–99)
Glucose-Capillary: 112 mg/dL — ABNORMAL HIGH (ref 70–99)
Glucose-Capillary: 136 mg/dL — ABNORMAL HIGH (ref 70–99)

## 2011-10-11 LAB — GLUCOSE, CAPILLARY: Glucose-Capillary: 155 mg/dL — ABNORMAL HIGH (ref 70–99)

## 2011-10-12 ENCOUNTER — Ambulatory Visit (HOSPITAL_COMMUNITY)
Admission: RE | Admit: 2011-10-12 | Discharge: 2011-10-12 | Disposition: A | Discharge status: Home / Self Care | Payer: Medicare Other | Source: Other Acute Inpatient Hospital | Attending: Internal Medicine | Admitting: Internal Medicine

## 2011-10-12 ENCOUNTER — Ambulatory Visit (HOSPITAL_COMMUNITY): Payer: Medicare Other | Attending: Internal Medicine

## 2011-10-12 DIAGNOSIS — M545 Low back pain, unspecified: Secondary | ICD-10-CM | POA: Insufficient documentation

## 2011-10-12 DIAGNOSIS — M47817 Spondylosis without myelopathy or radiculopathy, lumbosacral region: Secondary | ICD-10-CM | POA: Insufficient documentation

## 2011-10-12 LAB — GLUCOSE, CAPILLARY
Glucose-Capillary: 100 mg/dL — ABNORMAL HIGH (ref 70–99)
Glucose-Capillary: 137 mg/dL — ABNORMAL HIGH (ref 70–99)

## 2011-10-13 LAB — GLUCOSE, CAPILLARY
Glucose-Capillary: 134 mg/dL — ABNORMAL HIGH (ref 70–99)
Glucose-Capillary: 152 mg/dL — ABNORMAL HIGH (ref 70–99)
Glucose-Capillary: 169 mg/dL — ABNORMAL HIGH (ref 70–99)

## 2011-10-14 LAB — GLUCOSE, CAPILLARY
Glucose-Capillary: 123 mg/dL — ABNORMAL HIGH (ref 70–99)
Glucose-Capillary: 159 mg/dL — ABNORMAL HIGH (ref 70–99)

## 2011-10-15 LAB — GLUCOSE, CAPILLARY: Glucose-Capillary: 138 mg/dL — ABNORMAL HIGH (ref 70–99)

## 2011-10-16 ENCOUNTER — Ambulatory Visit: Payer: Self-pay | Admitting: Cardiology

## 2011-10-17 LAB — GLUCOSE, CAPILLARY: Glucose-Capillary: 111 mg/dL — ABNORMAL HIGH (ref 70–99)

## 2011-10-20 LAB — GLUCOSE, CAPILLARY: Glucose-Capillary: 106 mg/dL — ABNORMAL HIGH (ref 70–99)

## 2011-11-07 LAB — GLUCOSE, CAPILLARY: Glucose-Capillary: 115 mg/dL — ABNORMAL HIGH (ref 70–99)

## 2011-11-15 ENCOUNTER — Ambulatory Visit (HOSPITAL_COMMUNITY): Payer: Medicare Other | Attending: Internal Medicine

## 2011-11-15 ENCOUNTER — Inpatient Hospital Stay (HOSPITAL_COMMUNITY): Payer: Medicare Other | Attending: Internal Medicine

## 2011-11-15 DIAGNOSIS — R109 Unspecified abdominal pain: Secondary | ICD-10-CM | POA: Insufficient documentation

## 2011-11-15 DIAGNOSIS — M545 Low back pain, unspecified: Secondary | ICD-10-CM | POA: Insufficient documentation

## 2011-11-15 DIAGNOSIS — M5380 Other specified dorsopathies, site unspecified: Secondary | ICD-10-CM | POA: Insufficient documentation

## 2011-11-20 ENCOUNTER — Telehealth: Payer: Self-pay | Admitting: Family Medicine

## 2011-11-20 NOTE — Telephone Encounter (Signed)
Spoke to pt's daughter, and they are having some help they need from home health but not enough nursing care.  [facility].  The Vicodin 5/500 is not touching his pain. He hurts all over, and is in fetal position with his back problems, etc.  Cannot get him to the office with all the pain he is in, but they do want Dr. Caryl Never to take him back as a pt.  Requesting stronger pain meds at this point, and increased home health with nursing, and everyday hygiene.  She and her mother are not able to do it all.

## 2011-11-20 NOTE — Telephone Encounter (Signed)
I know transferring might be difficult but we really need to evaluate before other pain meds can be given.

## 2011-11-20 NOTE — Telephone Encounter (Signed)
Wife and daughter informed, we will see if he is eligible for [facility].  Pt will need to be transported to office, family will consider

## 2011-11-20 NOTE — Telephone Encounter (Signed)
Pts daughter called and said that her father is in extreme pain. Pt had a stroke at end of October 2012. [facility] sent pt home with Vicodin 5-500mg  and also Ativan. Pt screams in pain night and day. Pt has 3 bulging disk in back. Pt is bed ridden. Req a stronger pain med. Req nurse to call back asap.

## 2011-11-21 ENCOUNTER — Emergency Department (HOSPITAL_COMMUNITY): Payer: Medicare Other

## 2011-11-21 ENCOUNTER — Encounter (HOSPITAL_COMMUNITY): Payer: Self-pay | Admitting: Emergency Medicine

## 2011-11-21 ENCOUNTER — Inpatient Hospital Stay (HOSPITAL_COMMUNITY)
Admission: EM | Admit: 2011-11-21 | Discharge: 2011-11-24 | DRG: 551 | Disposition: A | Discharge status: Hospice | Payer: Medicare Other | Attending: Internal Medicine | Admitting: Internal Medicine

## 2011-11-21 DIAGNOSIS — M5137 Other intervertebral disc degeneration, lumbosacral region: Principal | ICD-10-CM | POA: Diagnosis present

## 2011-11-21 DIAGNOSIS — Z823 Family history of stroke: Secondary | ICD-10-CM

## 2011-11-21 DIAGNOSIS — I2510 Atherosclerotic heart disease of native coronary artery without angina pectoris: Secondary | ICD-10-CM | POA: Diagnosis present

## 2011-11-21 DIAGNOSIS — Z7401 Bed confinement status: Secondary | ICD-10-CM

## 2011-11-21 DIAGNOSIS — R5381 Other malaise: Secondary | ICD-10-CM | POA: Diagnosis present

## 2011-11-21 DIAGNOSIS — Z66 Do not resuscitate: Secondary | ICD-10-CM | POA: Diagnosis present

## 2011-11-21 DIAGNOSIS — J61 Pneumoconiosis due to asbestos and other mineral fibers: Secondary | ICD-10-CM | POA: Diagnosis present

## 2011-11-21 DIAGNOSIS — M51379 Other intervertebral disc degeneration, lumbosacral region without mention of lumbar back pain or lower extremity pain: Principal | ICD-10-CM | POA: Diagnosis present

## 2011-11-21 DIAGNOSIS — Z951 Presence of aortocoronary bypass graft: Secondary | ICD-10-CM

## 2011-11-21 DIAGNOSIS — E43 Unspecified severe protein-calorie malnutrition: Secondary | ICD-10-CM | POA: Diagnosis present

## 2011-11-21 DIAGNOSIS — R259 Unspecified abnormal involuntary movements: Secondary | ICD-10-CM | POA: Diagnosis present

## 2011-11-21 DIAGNOSIS — Z515 Encounter for palliative care: Secondary | ICD-10-CM

## 2011-11-21 DIAGNOSIS — E871 Hypo-osmolality and hyponatremia: Secondary | ICD-10-CM | POA: Diagnosis present

## 2011-11-21 DIAGNOSIS — Z7982 Long term (current) use of aspirin: Secondary | ICD-10-CM

## 2011-11-21 DIAGNOSIS — Z8673 Personal history of transient ischemic attack (TIA), and cerebral infarction without residual deficits: Secondary | ICD-10-CM

## 2011-11-21 DIAGNOSIS — IMO0002 Reserved for concepts with insufficient information to code with codable children: Secondary | ICD-10-CM

## 2011-11-21 DIAGNOSIS — F0280 Dementia in other diseases classified elsewhere without behavioral disturbance: Secondary | ICD-10-CM | POA: Diagnosis present

## 2011-11-21 DIAGNOSIS — M62838 Other muscle spasm: Secondary | ICD-10-CM | POA: Diagnosis present

## 2011-11-21 DIAGNOSIS — F72 Severe intellectual disabilities: Secondary | ICD-10-CM | POA: Diagnosis present

## 2011-11-21 DIAGNOSIS — I6789 Other cerebrovascular disease: Secondary | ICD-10-CM | POA: Diagnosis present

## 2011-11-21 DIAGNOSIS — M549 Dorsalgia, unspecified: Secondary | ICD-10-CM | POA: Diagnosis present

## 2011-11-21 DIAGNOSIS — F0390 Unspecified dementia without behavioral disturbance: Secondary | ICD-10-CM | POA: Diagnosis present

## 2011-11-21 DIAGNOSIS — I679 Cerebrovascular disease, unspecified: Secondary | ICD-10-CM | POA: Diagnosis present

## 2011-11-21 DIAGNOSIS — R627 Adult failure to thrive: Secondary | ICD-10-CM | POA: Diagnosis present

## 2011-11-21 LAB — URINALYSIS, ROUTINE W REFLEX MICROSCOPIC
Bilirubin Urine: NEGATIVE
Ketones, ur: NEGATIVE mg/dL
Nitrite: NEGATIVE
pH: 7 (ref 5.0–8.0)

## 2011-11-21 LAB — DIFFERENTIAL
Lymphocytes Relative: 23 % (ref 12–46)
Lymphs Abs: 2.4 10*3/uL (ref 0.7–4.0)
Neutro Abs: 7.1 10*3/uL (ref 1.7–7.7)
Neutrophils Relative %: 68 % (ref 43–77)

## 2011-11-21 LAB — COMPREHENSIVE METABOLIC PANEL
ALT: 20 U/L (ref 0–53)
Alkaline Phosphatase: 76 U/L (ref 39–117)
CO2: 28 mEq/L (ref 19–32)
Chloride: 101 mEq/L (ref 96–112)
GFR calc Af Amer: >90 mL/min (ref >90)
GFR calc non Af Amer: 83 mL/min — ABNORMAL LOW (ref >90)
Glucose, Bld: 130 mg/dL — ABNORMAL HIGH (ref 70–99)
Potassium: 4.5 mEq/L (ref 3.5–5.1)
Sodium: 137 mEq/L (ref 135–145)
Total Protein: 5.9 g/dL — ABNORMAL LOW (ref 6.0–8.3)

## 2011-11-21 LAB — CBC
Platelets: 246 10*3/uL (ref 150–400)
RBC: 4.12 MIL/uL — ABNORMAL LOW (ref 4.22–5.81)
WBC: 10.4 10*3/uL (ref 4.0–10.5)

## 2011-11-21 MED ORDER — SODIUM CHLORIDE 0.9 % IJ SOLN
INTRAMUSCULAR | Status: AC
  Administered 2011-11-21: 3 mL
  Filled 2011-11-21: qty 3

## 2011-11-21 MED ORDER — SODIUM CHLORIDE 0.9 % IV SOLN
INTRAVENOUS | Status: DC
  Administered 2011-11-21: 19:00:00 via INTRAVENOUS

## 2011-11-21 MED ORDER — SENNA 8.6 MG PO TABS: 1.0000 | ORAL_TABLET | Freq: Two times a day (BID) | ORAL | Status: DC

## 2011-11-21 MED ORDER — HYDROMORPHONE HCL PF 1 MG/ML IJ SOLN
0.5000 mg | Freq: Once | INTRAMUSCULAR | Status: AC
  Administered 2011-11-21: 1 mg via INTRAVENOUS
  Filled 2011-11-21 (×2): qty 1

## 2011-11-21 MED ORDER — HYDROMORPHONE HCL PF 1 MG/ML IJ SOLN
INTRAMUSCULAR | Status: AC
  Filled 2011-11-21: qty 1

## 2011-11-21 MED ORDER — HEPARIN SODIUM (PORCINE) 5000 UNIT/ML IJ SOLN
5000.0000 [IU] | Freq: Three times a day (TID) | INTRAMUSCULAR | Status: DC
  Administered 2011-11-21 – 2011-11-23 (×6): 5000 [IU] via SUBCUTANEOUS
  Filled 2011-11-21 (×9): qty 1

## 2011-11-21 MED ORDER — SODIUM CHLORIDE 0.9 % IV BOLUS (SEPSIS)
250.0000 mL | Freq: Once | INTRAVENOUS | Status: AC
  Administered 2011-11-21: 13:00:00 via INTRAVENOUS
  Administered 2011-11-21: 250 mL via INTRAVENOUS

## 2011-11-21 MED ORDER — HYDROMORPHONE HCL PF 1 MG/ML IJ SOLN
1.0000 mg | Freq: Once | INTRAMUSCULAR | Status: DC
  Filled 2011-11-21: qty 1

## 2011-11-21 MED ORDER — PANTOPRAZOLE SODIUM 40 MG PO TBEC
40.0000 mg | DELAYED_RELEASE_TABLET | Freq: Every day | ORAL | Status: DC
  Administered 2011-11-22 – 2011-11-23 (×2): 40 mg via ORAL
  Filled 2011-11-21 (×2): qty 1

## 2011-11-21 MED ORDER — TIOTROPIUM BROMIDE MONOHYDRATE 18 MCG IN CAPS
18.0000 ug | ORAL_CAPSULE | Freq: Every day | RESPIRATORY_TRACT | Status: DC
  Filled 2011-11-21: qty 5

## 2011-11-21 MED ORDER — LORAZEPAM 2 MG/ML IJ SOLN
0.5000 mg | Freq: Once | INTRAMUSCULAR | Status: AC
  Administered 2011-11-21: 0.5 mg via INTRAVENOUS
  Filled 2011-11-21: qty 1

## 2011-11-21 MED ORDER — CLOPIDOGREL BISULFATE 75 MG PO TABS
75.0000 mg | ORAL_TABLET | Freq: Every day | ORAL | Status: DC
  Administered 2011-11-22 – 2011-11-23 (×2): 75 mg via ORAL
  Filled 2011-11-21 (×2): qty 1

## 2011-11-21 MED ORDER — ACETAMINOPHEN 650 MG RE SUPP
650.0000 mg | Freq: Four times a day (QID) | RECTAL | Status: DC | PRN
  Administered 2011-11-24: 650 mg via RECTAL
  Filled 2011-11-21: qty 1

## 2011-11-21 MED ORDER — ONDANSETRON HCL 4 MG/2ML IJ SOLN
4.0000 mg | Freq: Four times a day (QID) | INTRAMUSCULAR | Status: DC | PRN
  Administered 2011-11-22: 4 mg via INTRAVENOUS
  Filled 2011-11-21: qty 2

## 2011-11-21 MED ORDER — LORAZEPAM 1 MG PO TABS
1.0000 mg | ORAL_TABLET | Freq: Three times a day (TID) | ORAL | Status: DC
  Administered 2011-11-22 – 2011-11-23 (×5): 1 mg via ORAL
  Filled 2011-11-21 (×5): qty 1

## 2011-11-21 MED ORDER — ASPIRIN EC 81 MG PO TBEC
81.0000 mg | DELAYED_RELEASE_TABLET | Freq: Every day | ORAL | Status: DC
  Administered 2011-11-22 – 2011-11-23 (×2): 81 mg via ORAL
  Filled 2011-11-21 (×6): qty 1

## 2011-11-21 MED ORDER — HYDROMORPHONE HCL PF 1 MG/ML IJ SOLN
0.5000 mg | Freq: Once | INTRAMUSCULAR | Status: AC
  Administered 2011-11-21: 0.5 mg via INTRAVENOUS

## 2011-11-21 MED ORDER — ACETAMINOPHEN 325 MG PO TABS: 650.0000 mg | ORAL_TABLET | Freq: Four times a day (QID) | ORAL | Status: DC | PRN

## 2011-11-21 MED ORDER — METHADONE HCL 10 MG PO TABS
5.0000 mg | ORAL_TABLET | Freq: Three times a day (TID) | ORAL | Status: DC
  Administered 2011-11-21 – 2011-11-23 (×7): 5 mg via ORAL
  Filled 2011-11-21 (×5): qty 1
  Filled 2011-11-21: qty 2
  Filled 2011-11-21: qty 1

## 2011-11-21 MED ORDER — DOCUSATE SODIUM 100 MG PO CAPS: 100.0000 mg | ORAL_CAPSULE | Freq: Two times a day (BID) | ORAL | Status: DC

## 2011-11-21 MED ORDER — LORAZEPAM 2 MG/ML IJ SOLN
1.0000 mg | Freq: Once | INTRAMUSCULAR | Status: AC
  Administered 2011-11-21: 1 mg via INTRAVENOUS

## 2011-11-21 MED ORDER — FLUTICASONE-SALMETEROL 250-50 MCG/DOSE IN AEPB
1.0000 | INHALATION_SPRAY | Freq: Two times a day (BID) | RESPIRATORY_TRACT | Status: DC
  Filled 2011-11-21: qty 14

## 2011-11-21 MED ORDER — SODIUM CHLORIDE 0.9 % IV SOLN
Freq: Once | INTRAVENOUS | Status: AC
  Administered 2011-11-21: 11:00:00 via INTRAVENOUS

## 2011-11-21 MED ORDER — ONDANSETRON HCL 4 MG/2ML IJ SOLN
4.0000 mg | Freq: Once | INTRAMUSCULAR | Status: AC
  Administered 2011-11-21: 4 mg via INTRAVENOUS
  Filled 2011-11-21 (×2): qty 2

## 2011-11-21 MED ORDER — HYDROMORPHONE HCL PF 1 MG/ML IJ SOLN
1.0000 mg | INTRAMUSCULAR | Status: DC | PRN
  Administered 2011-11-21 – 2011-11-22 (×5): 1 mg via INTRAVENOUS
  Filled 2011-11-21 (×5): qty 1

## 2011-11-21 MED ORDER — SENNOSIDES-DOCUSATE SODIUM 8.6-50 MG PO TABS
1.0000 | ORAL_TABLET | Freq: Two times a day (BID) | ORAL | Status: DC
  Administered 2011-11-21 – 2011-11-23 (×5): 1 via ORAL
  Filled 2011-11-21 (×7): qty 1

## 2011-11-21 MED ORDER — ENSURE COMPLETE PO LIQD
237.0000 mL | Freq: Two times a day (BID) | ORAL | Status: DC
  Administered 2011-11-22 – 2011-11-23 (×4): 237 mL via ORAL
  Filled 2011-11-21: qty 237

## 2011-11-21 MED ORDER — LORAZEPAM 2 MG/ML IJ SOLN
1.0000 mg | Freq: Four times a day (QID) | INTRAMUSCULAR | Status: DC | PRN
  Administered 2011-11-21 – 2011-11-22 (×3): 1 mg via INTRAVENOUS
  Filled 2011-11-21 (×4): qty 1

## 2011-11-21 MED ORDER — ACETAMINOPHEN 500 MG PO TABS
1000.0000 mg | ORAL_TABLET | Freq: Two times a day (BID) | ORAL | Status: DC
  Administered 2011-11-22 – 2011-11-23 (×4): 1000 mg via ORAL
  Filled 2011-11-21 (×4): qty 2

## 2011-11-21 MED ORDER — ONDANSETRON HCL 4 MG PO TABS: 4.0000 mg | ORAL_TABLET | Freq: Four times a day (QID) | ORAL | Status: DC | PRN

## 2011-11-21 NOTE — ED Notes (Signed)
Pt. Resting comfortably. Family refused pain medicine at this time.

## 2011-11-21 NOTE — ED Notes (Signed)
Pt at MRI was too restless for scan, edp ordered 1mg  ativan.

## 2011-11-21 NOTE — ED Provider Notes (Signed)
History     CSN: [id number]  Arrival date & time 11/21/11  0844   First MD Initiated Contact with Patient 11/21/11 1022      Chief Complaint  Patient presents with  . Back Pain  . Dementia    (Consider location/radiation/quality/duration/timing/severity/associated sxs/prior treatment) HPI Comments: Patient with a history of low back pain and dementia is brought to the ED by his spouse and daughter. Family members state that he has been having increasing pain in his lower back for several days. Patient was recently released from the [facility] and advised to have home hospice care.  Family states that they have contacted his primary care physician and a office appointment was made but they state they brought him to the ER for pain control and possible nursing home placement.  Family states they are unable to manage him at home. They state his pain is not controlled with his current pain medication. Family is also concerned that due to moving him around for his ADLs whether or not they have injured his lower back further.    Patient is a 78 y.o. male presenting with back pain. The history is provided by the spouse (Daughter). History Limited By: Patient has a history of dementia. No language interpreter was used.  Back Pain  This is a recurrent problem. The current episode started 2 days ago. The problem occurs constantly. The problem has been gradually worsening. The pain is associated with no known injury. The pain is present in the lumbar spine. The quality of the pain is described as shooting. The pain is moderate. The pain is the same all the time. He has tried analgesics for the symptoms. The treatment provided no relief.    Past Medical History  Diagnosis Date  . Encounter for long-term (current) use of anticoagulants 12/09/2010  . Hypoxemia 05/15/2009  . Atrial fibrillation 01/19/2009  . CORONARY ARTERY DISEASE 10/07/2007  . AORTIC STENOSIS, SEVERE 10/07/2007  . PVD 10/07/2007  .  HYPERLIPIDEMIA 10/07/2007  . HYPERTENSION 10/07/2007  . CEREBROVASCULAR DISEASE 10/07/2007  . DYSPNEA 02/14/2008  . OBSTRUCTIVE SLEEP APNEA 10/07/2007  . Asbestosis 02/21/2008  . History of colon cancer   . DDD (degenerative disc disease)     low back  . Colon polyp   . Pneumonia   . Respiratory failure   . Leukocytosis   . Thrombocytopenia   . Dehydration   . Dementia   . CHF (congestive heart failure)   . Esophageal stricture     multiple dilation    Past Surgical History  Procedure Date  . Abdominal aortic aneurysm repair 1992  . Coronary artery bypass graft 2005  . Aortic valve replacement 2005  . Knee surgery     left  . Eye surgery   . Esophagogastroduodenoscopy 4/09    Dr. Rosealee Albee food bolus, distal esophageal ring narrowed esophagus to 11-70mm. Patient was on coumadin for no dilation. Supposed to come back but did not.  . Aorto-femoral bypass graft     Family History  Problem Relation Age of Onset  . COPD    . Colon cancer      1/2 sister  . Lung cancer    . Heart disease    . Coronary artery disease    . Stroke Mother 31  . Heart disease Father     History  Substance Use Topics  . Smoking status: Former Smoker -- 1.0 packs/day for 20 years    Types: Cigarettes    Quit date:  12/16/1973  . Smokeless tobacco: Never Used  . Alcohol Use: No      Review of Systems  Unable to perform ROS Musculoskeletal: Positive for back pain.    Allergies  Morphine and Ultram  Home Medications   Current Outpatient Rx  Name Route Sig Dispense Refill  . ACETAMINOPHEN 500 MG PO TABS Oral Take 1,000 mg by mouth 2 (two) times daily.    . ACETAMINOPHEN 500 MG PO TABS Oral Take 500 mg by mouth every 6 (six) hours as needed. Only one additional dose if needed with the scheduled dose    . ASPIRIN 81 MG PO TBEC Oral Take 81 mg by mouth daily.      Marland Kitchen CLOPIDOGREL BISULFATE 75 MG PO TABS Oral Take 75 mg by mouth daily with breakfast.     . DONEPEZIL HCL 10 MG PO TABS  Oral Take 10 mg by mouth at bedtime.    . ENSURE CLINICAL ST REVIGOR PO LIQD Oral Take 237 mLs by mouth 2 (two) times daily between meals.    Marland Kitchen FLUTICASONE-SALMETEROL 250-50 MCG/DOSE IN AEPB Inhalation Inhale 1 puff into the lungs every 12 (twelve) hours.    Marland Kitchen HYDROCODONE-ACETAMINOPHEN 5-500 MG PO TABS Oral Take 1 tablet by mouth every 6 (six) hours as needed. pain    . LORAZEPAM 1 MG PO TABS Oral Take 1 mg by mouth 3 (three) times daily as needed. anxiety    . ONE-DAILY MULTI VITAMINS PO TABS Oral Take 1 tablet by mouth daily.     Marland Kitchen ONDANSETRON HCL 4 MG PO TABS  One every 6 hours for nauseau 20 tablet 0  . PANTOPRAZOLE SODIUM 40 MG PO TBEC Oral Take 40 mg by mouth daily.    Marland Kitchen PRAVASTATIN SODIUM 40 MG PO TABS Oral Take 40 mg by mouth at bedtime.     Marland Kitchen TIOTROPIUM BROMIDE MONOHYDRATE 18 MCG IN CAPS Inhalation Place 18 mcg into inhaler and inhale daily.      BP 170/91  Pulse 90  Temp(Src) 98.4 F (36.9 C) (Oral)  Resp 22  SpO2 95%  Physical Exam  Nursing note and vitals reviewed. Constitutional: He is oriented to person, place, and time. He appears well-developed and well-nourished. No distress.  HENT:  Head: Normocephalic and atraumatic.  Mouth/Throat: Oropharynx is clear and moist.  Eyes: EOM are normal. Pupils are equal, round, and reactive to light.  Neck: Normal range of motion. Neck supple.  Cardiovascular: Normal rate, regular rhythm, normal heart sounds and intact distal pulses.   No murmur heard. Pulmonary/Chest: Effort normal and breath sounds normal. No respiratory distress.  Abdominal: Soft. He exhibits no distension. There is no tenderness. There is no rebound and no guarding.  Musculoskeletal: Normal range of motion. He exhibits tenderness. He exhibits no edema.       Lumbar back: He exhibits tenderness. He exhibits no swelling and normal pulse.       Back:       Patient grimaces and moans with palpation of the lower lumbar area.  Lymphadenopathy:    He has no cervical  adenopathy.  Neurological: He is alert and oriented to person, place, and time. No cranial nerve deficit or sensory deficit. He exhibits normal muscle tone. Coordination normal.  Reflex Scores:      Patellar reflexes are 2+ on the right side and 2+ on the left side.      Achilles reflexes are 2+ on the right side and 2+ on the left side.  Patient is nonambulatory due to previous stroke  Skin: Skin is warm and dry.    ED Course  Procedures (including critical care time)  Labs Reviewed  CBC - Abnormal; Notable for the following:    RBC 4.12 (*)    Hemoglobin 12.6 (*)    HCT 37.7 (*)    All other components within normal limits  COMPREHENSIVE METABOLIC PANEL - Abnormal; Notable for the following:    Glucose, Bld 130 (*)    Total Protein 5.9 (*)    Albumin 2.8 (*)    GFR calc non Af Amer 83 (*)    All other components within normal limits  DIFFERENTIAL  URINALYSIS, ROUTINE W REFLEX MICROSCOPIC   Dg Chest Port 1 View  11/21/2011  *RADIOLOGY REPORT*  Clinical Data: Back pain  PORTABLE CHEST - 1 VIEW  Comparison: 10/03/2011  Findings: Cardiomegaly again noted.  Status post CABG again noted. Stable bilateral chronic interstitial lung disease.  Stable right lateral pleural thickening.  No definite infiltrate or superimposed pulmonary edema.  IMPRESSION:  Status post CABG again noted.  Stable bilateral chronic interstitial lung disease.  Stable right lateral pleural thickening.  No definite infiltrate or superimposed pulmonary edema.  Original Report Authenticated By: Natasha Mead, M.D.        MDM  Previous ER charts and nursing notes ,and imaging were reviewed by me.    Patient comes to ED with history of dementia, low back pain and multiple other medical issues who was recently released from the [facility] center has a history of increasing back pain for several days. Patient had an LS-spine film on 11/15/2011 that showed multiple osteophytes. Patient's family reports his pain is not  controlled with Vicodin. Labs and x-rays today were unremarkable. Despite multiple courses of pain medication patient still complains of pain to his lower back. Patient has also been seen by the EDP is care plan has been discussed. Family is also requesting placement for the patient. I will consult hospitalist for possible admission for pain control.  I have consulted Dr. Karilyn Cota and he will admit.    Maryama Kuriakose L. [place], [place] 11/21/11 1449

## 2011-11-21 NOTE — ED Notes (Signed)
Pt has dementia. Family states pt has been hollaring all night with back pain. Pt has hx of pinched nerves/disk. Pt is alert/not oriented to place/time or why here. Tries to follow commands. nad at this time. Pt occasionally moans/hollars out in ed. Denies fall.

## 2011-11-21 NOTE — Telephone Encounter (Signed)
Per Dr Caryl Never, we are referring pt to [facility] to see if he is eligible for their services.

## 2011-11-21 NOTE — H&P (Signed)
[name] MRN: [id number] DOB/AGE: 01/26/1922 78 y.o. Primary Care Physician:ROBSON,MICHAEL Kellie Shropshire, MD, MD Admit date: 11/21/2011 Chief Complaint: Back pain. HPI: This 78 year old man, who appears to have moderate to severe dementia, presents with the above symptoms. He was in the skilled nursing facility, [facility] until 3 days ago when he was discharged home for financial reasons. Since he has been at home, he has been awake at night in pain from his back. He apparently was like this in the skilled nursing facility also. He has lost 20 pounds in the last 6 months since he had his stroke in October 2012. He has a modified diet because of swallowing issues. He has become more confused over the last 6 months and now he is unable to walk, nor perform any activities of daily living such as bathing and dressing. He needs to be fed. He is incontinent of urine and feces. He does appear to recognize his family but not all the time. He has multiple medical problems outlined below. The family are at a loss of how to manage with him at home because he is in so much pain.  Past Medical History  Diagnosis Date  . Encounter for long-term (current) use of anticoagulants 12/09/2010  . Hypoxemia 05/15/2009  . Atrial fibrillation 01/19/2009  . CORONARY ARTERY DISEASE 10/07/2007  . AORTIC STENOSIS, SEVERE 10/07/2007  . PVD 10/07/2007  . HYPERLIPIDEMIA 10/07/2007  . HYPERTENSION 10/07/2007  . CEREBROVASCULAR DISEASE 10/07/2007  . DYSPNEA 02/14/2008  . OBSTRUCTIVE SLEEP APNEA 10/07/2007  . Asbestosis 02/21/2008  . History of colon cancer   . DDD (degenerative disc disease)     low back  . Colon polyp   . Pneumonia   . Respiratory failure   . Leukocytosis   . Thrombocytopenia   . Dehydration   . Dementia   . CHF (congestive heart failure)   . Esophageal stricture     multiple dilation   Past Surgical History  Procedure Date  . Abdominal aortic aneurysm repair 1992  . Coronary artery bypass graft 2005  . Aortic  valve replacement 2005  . Knee surgery     left  . Eye surgery   . Esophagogastroduodenoscopy 4/09    Dr. Rosealee Albee food bolus, distal esophageal ring narrowed esophagus to 11-2mm. Patient was on coumadin for no dilation. Supposed to come back but did not.  . Aorto-femoral bypass graft         Family History  Problem Relation Age of Onset  . COPD    . Colon cancer      1/2 sister  . Lung cancer    . Heart disease    . Coronary artery disease    . Stroke Mother 29  . Heart disease Father     Social History: He has been married for over 60 years. He currently does not smoke nor does he drink alcohol. He requires total care.   Allergies:  Allergies  Allergen Reactions  . Morphine Nausea And Vomiting  . Ultram (Tramadol Hcl) Other (See Comments)    Made "wild"    Medications Prior to Admission  Medication Dose Route Frequency Provider Last Rate Last Dose  . 0.9 %  sodium chloride infusion   Intravenous Once Tammy L. Triplett, PA 100 mL/hr at 11/21/11 1110    . 0.9 %  sodium chloride infusion   Intravenous Continuous Asanti Craigo C Crisol Muecke, MD      . acetaminophen (TYLENOL) tablet 650 mg  650 mg Oral Q6H PRN  Wilson Singer, MD       Or  . acetaminophen (TYLENOL) suppository 650 mg  650 mg Rectal Q6H PRN Juline Sanderford Normajean Glasgow, MD      . acetaminophen (TYLENOL) tablet 1,000 mg  1,000 mg Oral BID Savaya Hakes Normajean Glasgow, MD      . aspirin EC tablet 81 mg  81 mg Oral Daily Mohogany Toppins C Chaniah Cisse, MD      . clopidogrel (PLAVIX) tablet 75 mg  75 mg Oral Q breakfast Elijahjames Fuelling C Buster Schueller, MD      . feeding supplement (ENSURE CLINICAL STRENGTH) liquid 237 mL  237 mL Oral BID BM Alizaya Oshea C Leeon Makar, MD      . Fluticasone-Salmeterol (ADVAIR) 250-50 MCG/DOSE inhaler 1 puff  1 puff Inhalation Q12H Nasra Counce C Detrice Cales, MD      . heparin injection 5,000 Units  5,000 Units Subcutaneous Q8H Aquiles Ruffini C Diva Lemberger, MD      . HYDROmorphone (DILAUDID) injection 0.5 mg  0.5 mg Intravenous Once Tammy L. Triplett, PA   1 mg  at 11/21/11 1152  . HYDROmorphone (DILAUDID) injection 0.5 mg  0.5 mg Intravenous Once Tammy L. Triplett, PA   0.5 mg at 11/21/11 1219  . HYDROmorphone (DILAUDID) injection 1 mg  1 mg Intravenous Once Tammy L. Triplett, PA      . HYDROmorphone (DILAUDID) injection 1 mg  1 mg Intravenous Q4H PRN Demeka Sutter C Parthena Fergeson, MD      . LORazepam (ATIVAN) injection 0.5 mg  0.5 mg Intravenous Once Tammy L. Triplett, PA   0.5 mg at 11/21/11 1242  . LORazepam (ATIVAN) injection 1 mg  1 mg Intravenous Q6H PRN Adonis Ryther C Olajuwon Fosdick, MD      . LORazepam (ATIVAN) tablet 1 mg  1 mg Oral Q8H Oliviagrace Crisanti C Yessica Putnam, MD      . methadone (DOLOPHINE) tablet 5 mg  5 mg Oral Q8H Nilo Fallin C Caleen Taaffe, MD      . ondansetron (ZOFRAN) injection 4 mg  4 mg Intravenous Once Tammy L. Triplett, PA   4 mg at 11/21/11 1151  . ondansetron (ZOFRAN) tablet 4 mg  4 mg Oral Q6H PRN Bailyn Spackman Normajean Glasgow, MD       Or  . ondansetron (ZOFRAN) injection 4 mg  4 mg Intravenous Q6H PRN Graylee Arutyunyan C Reighlynn Swiney, MD      . pantoprazole (PROTONIX) EC tablet 40 mg  40 mg Oral Daily Khamani Daniely C Shaka Cardin, MD      . senna-docusate (Senokot-S) tablet 1 tablet  1 tablet Oral BID Jordell Outten C Kelda Azad, MD      . sodium chloride 0.9 % bolus 250 mL  250 mL Intravenous Once Tammy L. Triplett, PA      . tiotropium (SPIRIVA) inhalation capsule 18 mcg  18 mcg Inhalation Daily Guhan Bruington Normajean Glasgow, MD      . DISCONTD: docusate sodium (COLACE) capsule 100 mg  100 mg Oral BID Joni Norrod Normajean Glasgow, MD      . DISCONTD: senna (SENOKOT) tablet 8.6 mg  1 tablet Oral BID Abdul Beirne Normajean Glasgow, MD       Medications Prior to Admission  Medication Sig Dispense Refill  . acetaminophen (TYLENOL) 500 MG tablet Take 1,000 mg by mouth 2 (two) times daily.      Marland Kitchen acetaminophen (TYLENOL) 500 MG tablet Take 500 mg by mouth every 6 (six) hours as needed. Only one additional dose if needed with the scheduled dose      . aspirin 81 MG EC tablet Take 81 mg by mouth  daily.        . clopidogrel (PLAVIX) 75 MG tablet Take 75 mg by  mouth daily with breakfast.       . feeding supplement (ENSURE CLINICAL STRENGTH) LIQD Take 237 mLs by mouth 2 (two) times daily between meals.      . Multiple Vitamin (MULTIVITAMIN) tablet Take 1 tablet by mouth daily.       . ondansetron (ZOFRAN) 4 MG tablet One every 6 hours for nauseau  20 tablet  0  . pravastatin (PRAVACHOL) 40 MG tablet Take 40 mg by mouth at bedtime.       Marland Kitchen tiotropium (SPIRIVA HANDIHALER) 18 MCG inhalation capsule Place 18 mcg into inhaler and inhale daily.           ZOX:WRUEA from the symptoms mentioned above,there are no other symptoms referable to all systems reviewed.  Physical Exam: Blood pressure 128/98, pulse 100, temperature 98.4 F (36.9 C), temperature source Oral, resp. rate 20, SpO2 95.00%. He appears to be in pain when I go to examine him. He is intermittently alert. He is not able to hold a conversation with me. Heart sounds are present and normal. Lung fields appear to be clear. Abdomen is soft but slightly tense. He does not appear to have focal neurological signs but he is difficult to examine as he cannot really follow any commands.    Basename 11/21/11 0949  WBC 10.4  NEUTROABS 7.1  HGB 12.6*  HCT 37.7*  MCV 91.5  PLT 246    Basename 11/21/11 0949  NA 137  K 4.5  CL 101  CO2 28  GLUCOSE 130*  BUN 12  CREATININE 0.68  CALCIUM 9.6  MG --         Ct Abdomen Pelvis Wo Contrast  11/15/2011  *RADIOLOGY REPORT*  Clinical Data: Back pain.  Question renal stone.  CT ABDOMEN AND PELVIS WITHOUT CONTRAST  Technique:  Multidetector CT imaging of the abdomen and pelvis was performed following the standard protocol without intravenous contrast.  Comparison: Lumbar spine radiographs 11/15/2011.  Chest radiograph 10/03/2011  Findings: Images of the lung bases show stable.  Chronic cardiomegaly with postsurgical changes of median sternotomy for CABG and aortic valve replacement.  Pleural calcifications and pleural thickening are present  bilaterally, suggesting asbestosis. There is chronic interstitial prominence and scarring in the visualized lung fields. Small hiatal hernia noted.  Vascular calcifications are seen in association with the left kidney.  No definite renal stone is seen.  There is no hydronephrosis or urinary tract obstruction.  No ureteral or bladder stone is seen.  There are three low density exophytic lesions extending from the lower pole right kidney.  There is one exophytic low density lesion extending from the lower pole of the left kidney.  These all measure water density on noncontrast CT scans and most likely a simple cysts. Tiny exophytic tiny exophytic lesion in the upper pole left kidney not further characterized due to its small size. The largest renal cyst is on the left, measuring 6.8 cm.  Prior grafting of the distal abdominal aorta and proximal iliac arteries is present.  There is no aneurysm.  There is heavy atherosclerotic calcification of the abdominal aorta, the proximal celiac axis and its branches, and the mid superior mesenteric artery.  There are surgical clips in the region of the inferior mesenteric artery.  Noncontrast appearance of the liver, spleen, adrenal glands, and pancreas is within normal limits.  Several small, sub-centimeter gallstones are seen within the dependent  portion the gallbladder.  No evidence of gallbladder wall thickening or pericholecystic inflammatory change.  Bowel loops are normal in caliber.  Fairly prominent amount of stool throughout the colon, and in the rectum.  Urinary bladder and prostate gland are unremarkable.  A circumscribed lesion with thin sclerotic margins in the proximal right femur measures 2.5 cm and has of benign appearances.  There are no priors for comparison.  There are multilevel degenerative changes of the spine.  Degenerative changes are most prominent at L4-L5.  There is also posterior disc bulge at L3-L4.  IMPRESSION:  1.  Negative for urinary tract stone  or urinary tract obstruction. 2.  Several bilateral exophytic renal lesions have appearances on noncontrast CT of simple cysts.  This could be confirmed with renal ultrasound, if desired. 3.  Question constipation.  Prominent amount of stool throughout colon and in the rectum.  No evidence of bowel obstruction. 4.  Cholelithiasis.  No complicating features identified. 5.  Grafting of the distal abdominal aorta and proximal iliac arteries.  Atherosclerotic calcification of the aorta and some of its branches. 6.  Asbestosis.  Chronic interstitial prominence at the lung bases 7.  Cardiomegaly with aortic valve replacement prior CABG. 8.  Multilevel degenerative disc disease.  Original Report Authenticated By: Britta Mccreedy, M.D.   Dg Lumbar Spine Complete  11/15/2011  *RADIOLOGY REPORT*  Clinical Data: Back pain, right flank pain  LUMBAR SPINE - COMPLETE 4+ VIEW  Comparison: Plain films 10/12/2011  Findings: Normal alignment of the lumbar vertebral bodies.  There is endplate osteophytosis at multiple levels.  No subluxation. No acute loss  of vertebral body height or disc height.  IMPRESSION:  1.  No interval change. 2.  No acute findings lumbar spine. 3.  Multiple levels of disc osteophytic disease.  Original Report Authenticated By: Genevive Bi, M.D.   Dg Chest Port 1 View  11/21/2011  *RADIOLOGY REPORT*  Clinical Data: Back pain  PORTABLE CHEST - 1 VIEW  Comparison: 10/03/2011  Findings: Cardiomegaly again noted.  Status post CABG again noted. Stable bilateral chronic interstitial lung disease.  Stable right lateral pleural thickening.  No definite infiltrate or superimposed pulmonary edema.  IMPRESSION:  Status post CABG again noted.  Stable bilateral chronic interstitial lung disease.  Stable right lateral pleural thickening.  No definite infiltrate or superimposed pulmonary edema.  Original Report Authenticated By: Natasha Mead, M.D.   Impression: 1. Back pain, likely from degenerative joint disease and  associated back spasm. I think his back pain also comes from non-physical pain, aggravated by his dementia. 2. End stage dementia. 3. Cerebrovascular disease. 4. History of asbestosis and obstructive sleep apnea. 5. Very poor functional ability with palliative performance score of 30%.     Plan: 1. Admit to medical floor. 2. MRI brain. I think he may well have sustained several mini strokes since his last stroke in October 2012. 3. Analgesia on a scheduled basis. 4. Benzodiazepine on a scheduled basis as I think a percentage of his pain is nonphysical and appears to be related to his dementia. 5. DO NOT RESUSCITATE. I have spoken at length with the patient's wife, son and daughter, who are at the bedside regarding this man's pathophysiology and prognosis which I think is rather poor for the short and medium term. I think he is hospice eligible. I did discuss with the family the need to keep the patient comfortable and that his dementia is getting worse.      Wilson Singer Pager [phone]  11/21/2011, 3:36 PM

## 2011-11-22 LAB — CBC
MCV: 92.5 fL (ref 78.0–100.0)
Platelets: 250 10*3/uL (ref 150–400)
RBC: 4.02 MIL/uL — ABNORMAL LOW (ref 4.22–5.81)
WBC: 10.1 10*3/uL (ref 4.0–10.5)

## 2011-11-22 LAB — COMPREHENSIVE METABOLIC PANEL
Albumin: 2.8 g/dL — ABNORMAL LOW (ref 3.5–5.2)
BUN: 12 mg/dL (ref 6–23)
Chloride: 100 mEq/L (ref 96–112)
Creatinine, Ser: 0.76 mg/dL (ref 0.50–1.35)
GFR calc Af Amer: >90 mL/min (ref >90)
GFR calc non Af Amer: 80 mL/min — ABNORMAL LOW (ref >90)
Glucose, Bld: 114 mg/dL — ABNORMAL HIGH (ref 70–99)
Total Bilirubin: 0.5 mg/dL (ref 0.3–1.2)

## 2011-11-22 MED ORDER — SODIUM CHLORIDE 0.9 % IJ SOLN
INTRAMUSCULAR | Status: AC
  Administered 2011-11-22: 10 mL
  Filled 2011-11-22: qty 3

## 2011-11-22 MED ORDER — SODIUM CHLORIDE 0.9 % IJ SOLN
INTRAMUSCULAR | Status: AC
  Administered 2011-11-22: 3 mL
  Filled 2011-11-22: qty 3

## 2011-11-22 NOTE — Progress Notes (Signed)
Subjective: This man was admitted yesterday in quite severe pain. Today he seems to be better, less yelling out in pain. He has been eating approximately 50% of all meals so far.           Physical Exam: Blood pressure 148/72, pulse 92, temperature 98.7 F (37.1 C), temperature source Axillary, resp. rate 19, height 6' (1.829 m), weight 76.567 kg (168 lb 12.8 oz), SpO2 93.00%. Systemically he looks well. He is not particularly responsive to voice or commands. Lung fields appear to be clear heart sounds are present and normal. He will not let me examine him further.   Investigations:     Basic Metabolic Panel:  Basename 11/22/11 0800 11/21/11 0949  NA 136 137  K 4.3 4.5  CL 100 101  CO2 28 28  GLUCOSE 114* 130*  BUN 12 12  CREATININE 0.76 0.68  CALCIUM 9.7 9.6  MG -- --  PHOS -- --   Liver Function Tests:  [facility] 11/22/11 0800 11/21/11 0949  AST 14 18  ALT 17 20  ALKPHOS 73 76  BILITOT 0.5 0.3  PROT 5.9* 5.9*  ALBUMIN 2.8* 2.8*     CBC:  Basename 11/22/11 0800 11/21/11 0949  WBC 10.1 10.4  NEUTROABS -- 7.1  HGB 12.0* 12.6*  HCT 37.2* 37.7*  MCV 92.5 91.5  PLT 250 246    Mr Brain Wo Contrast  11/21/2011  *RADIOLOGY REPORT*  Clinical Data: 78 year old male with confusion, combative.  History of colon cancer.  MRI HEAD WITHOUT CONTRAST  Technique:  Multiplanar, multiecho pulse sequences of the brain and surrounding structures were obtained according to standard protocol without intravenous contrast.  Comparison: Brain MRI 06/30/2011.  Findings: No restricted diffusion to suggest acute infarction. Interval expected evolution of right paracentral pontine infarct seen in October.  Major intracranial vascular flow voids are stable. Stable ventricle size and configuration.  No acute intracranial hemorrhage identified.  No midline shift, mass effect, or evidence of mass lesion.  Negative pituitary, cervicomedullary junction, and visualized cervical spine.  Stable  to mildly increased nonspecific periventricular white matter T2 and FLAIR hyperintensity. Stable chronic lacunar infarct in the left cerebellum.  Chronic sphenoid sinus inflammatory changes. Visualized bone marrow signal is within normal limits.  Postoperative changes to the globes.  Other paranasal sinuses and mastoids are clear.  Negative scalp soft tissues.  IMPRESSION: No acute intracranial abnormality.  Interval expected evolution of brain stem infarct seen in October.  Original Report Authenticated By: Harley Hallmark, M.D.   Dg Chest Port 1 View  11/21/2011  *RADIOLOGY REPORT*  Clinical Data: Back pain  PORTABLE CHEST - 1 VIEW  Comparison: 10/03/2011  Findings: Cardiomegaly again noted.  Status post CABG again noted. Stable bilateral chronic interstitial lung disease.  Stable right lateral pleural thickening.  No definite infiltrate or superimposed pulmonary edema.  IMPRESSION:  Status post CABG again noted.  Stable bilateral chronic interstitial lung disease.  Stable right lateral pleural thickening.  No definite infiltrate or superimposed pulmonary edema.  Original Report Authenticated By: Natasha Mead, M.D.      Medications: I have reviewed the patient's current medications.  Impression: 1. Back pain, secondary to degenerative joint disease in the lumbar spine, associated with local muscle spasm. 2. Nonphysical pain, aggravated by dementia. 3. End stage dementia. 4. Cerebrovascular disease with MRI evidence of at least 2 strokes on the background of small vessel disease. 5. History of asbestosis and obstructive sleep apnea. 6. Palliative performance score of only 30% with dependence  on all activities of daily living.     Plan: 1. Continue scheduled methadone that I started yesterday. Methadone will take 3-5 days to reach steady state so I would not increase the dose until at least the 6 day. Use IV hydromorphone when necessary for breakthrough pain. 2. Continue scheduled Ativan. 3.  Discontinue IV fluids to stimulate thirst mechanism and obtain a baseline of oral intake. 4. Disposition-this man has end-stage dementia and pain control is of primary importance. I think he is a good candidate for hospice home.     LOS: 1 day   Wilson Singer Pager [phone]  11/22/2011, 9:38 AM

## 2011-11-22 NOTE — ED Provider Notes (Signed)
Medical screening examination/treatment/procedure(s) were performed by non-physician practitioner and as supervising physician I was immediately available for consultation/collaboration.  Helios Kohlmann S. Alette Kataoka, MD 11/22/11 1806 

## 2011-11-23 LAB — BASIC METABOLIC PANEL
BUN: 16 mg/dL (ref 6–23)
Chloride: 99 mEq/L (ref 96–112)
Glucose, Bld: 120 mg/dL — ABNORMAL HIGH (ref 70–99)
Potassium: 4.3 mEq/L (ref 3.5–5.1)
Sodium: 137 mEq/L (ref 135–145)

## 2011-11-23 LAB — CBC
HCT: 42.8 % (ref 39.0–52.0)
Hemoglobin: 13.6 g/dL (ref 13.0–17.0)
RBC: 4.6 MIL/uL (ref 4.22–5.81)
WBC: 17.2 10*3/uL — ABNORMAL HIGH (ref 4.0–10.5)

## 2011-11-23 MED ORDER — HYDROMORPHONE HCL PF 1 MG/ML IJ SOLN
1.0000 mg | INTRAMUSCULAR | Status: DC | PRN
  Administered 2011-11-23 – 2011-11-24 (×7): 1 mg via INTRAVENOUS
  Filled 2011-11-23 (×7): qty 1

## 2011-11-23 NOTE — Progress Notes (Signed)
Met with Mr. Deupree family in the room to offer support. All present were in favor of the Hospice consult . We will continue to monitor the progression of care and facilitate discharge planning as needed.  Gretta Cool, MSW, LCSW Clinical Social Work Chiropodist

## 2011-11-23 NOTE — Progress Notes (Signed)
Subjective: Does not respond to questions, moaning in pain, family at bedside, oral intake has been very poor  Objective: Vital signs in last 24 hours: Temp:  [96.8 F (36 C)-100.2 F (37.9 C)] 100.2 F (37.9 C) (03/24 0643) Pulse Rate:  [86-112] 110  (03/24 0643) Resp:  [18-24] 24  (03/24 0643) BP: (111-171)/(65-90) 111/81 mmHg (03/24 0643) SpO2:  [94 %-98 %] 95 % (03/24 0643) Weight change:  Last BM Date: 11/22/11  Intake/Output from previous day: 03/23 0701 - 03/24 0700 In: 2 [IV Piggyback:2] Out: -      Physical Exam: General: Appears to be uncomfortable in pain, laying in bed HEENT: No bruits, no goiter. Heart: irregular, tachycardic Lungs: crackles at bases Abdomen: Soft, nontender, nondistended, positive bowel sounds. Extremities: No clubbing cyanosis or edema with positive pedal pulses. Neuro: Grossly intact, nonfocal.    Lab Results: Basic Metabolic Panel:  Basename 11/23/11 0501 11/22/11 0800  NA 137 136  K 4.3 4.3  CL 99 100  CO2 28 28  GLUCOSE 120* 114*  BUN 16 12  CREATININE 0.82 0.76  CALCIUM 9.8 9.7  MG -- --  PHOS -- --   Liver Function Tests:  Basename 11/22/11 0800 11/21/11 0949  AST 14 18  ALT 17 20  ALKPHOS 73 76  BILITOT 0.5 0.3  PROT 5.9* 5.9*  ALBUMIN 2.8* 2.8*   No results found for this basename: LIPASE:2,AMYLASE:2 in the last 72 hours No results found for this basename: AMMONIA:2 in the last 72 hours CBC:  Basename 11/23/11 0501 11/22/11 0800 11/21/11 0949  WBC 17.2* 10.1 --  NEUTROABS -- -- 7.1  HGB 13.6 12.0* --  HCT 42.8 37.2* --  MCV 93.0 92.5 --  PLT 306 250 --   Cardiac Enzymes: No results found for this basename: CKTOTAL:3,CKMB:3,CKMBINDEX:3,TROPONINI:3 in the last 72 hours BNP: No results found for this basename: PROBNP:3 in the last 72 hours D-Dimer: No results found for this basename: DDIMER:2 in the last 72 hours CBG: No results found for this basename: GLUCAP:6 in the last 72 hours Hemoglobin A1C: No  results found for this basename: HGBA1C in the last 72 hours Fasting Lipid Panel: No results found for this basename: CHOL,HDL,LDLCALC,TRIG,CHOLHDL,LDLDIRECT in the last 72 hours Thyroid Function Tests: No results found for this basename: TSH,T4TOTAL,FREET4,T3FREE,THYROIDAB in the last 72 hours Anemia Panel: No results found for this basename: VITAMINB12,FOLATE,FERRITIN,TIBC,IRON,RETICCTPCT in the last 72 hours Coagulation: No results found for this basename: LABPROT:2,INR:2 in the last 72 hours Urine Drug Screen: Drugs of Abuse  No results found for this basename: labopia, cocainscrnur, labbenz, amphetmu, thcu, labbarb    Alcohol Level: No results found for this basename: ETH:2 in the last 72 hours Urinalysis:  Basename 11/21/11 1253  COLORURINE YELLOW  LABSPEC 1.010  PHURINE 7.0  GLUCOSEU NEGATIVE  HGBUR NEGATIVE  BILIRUBINUR NEGATIVE  KETONESUR NEGATIVE  PROTEINUR NEGATIVE  UROBILINOGEN 0.2  NITRITE NEGATIVE  LEUKOCYTESUR NEGATIVE   * No results found for this or any previous visit (from the past 240 hour(s)).  Studies/Results: Mr Sherrin Daisy Contrast  11/21/2011  *RADIOLOGY REPORT*  Clinical Data: 78 year old male with confusion, combative.  History of colon cancer.  MRI HEAD WITHOUT CONTRAST  Technique:  Multiplanar, multiecho pulse sequences of the brain and surrounding structures were obtained according to standard protocol without intravenous contrast.  Comparison: Brain MRI 06/30/2011.  Findings: No restricted diffusion to suggest acute infarction. Interval expected evolution of right paracentral pontine infarct seen in October.  Major intracranial vascular flow voids are stable.  Stable ventricle size and configuration.  No acute intracranial hemorrhage identified.  No midline shift, mass effect, or evidence of mass lesion.  Negative pituitary, cervicomedullary junction, and visualized cervical spine.  Stable to mildly increased nonspecific periventricular white matter T2 and  FLAIR hyperintensity. Stable chronic lacunar infarct in the left cerebellum.  Chronic sphenoid sinus inflammatory changes. Visualized bone marrow signal is within normal limits.  Postoperative changes to the globes.  Other paranasal sinuses and mastoids are clear.  Negative scalp soft tissues.  IMPRESSION: No acute intracranial abnormality.  Interval expected evolution of brain stem infarct seen in October.  Original Report Authenticated By: Harley Hallmark, M.D.    Medications: Scheduled Meds:   . acetaminophen  1,000 mg Oral BID  . aspirin EC  81 mg Oral Daily  . clopidogrel  75 mg Oral Q breakfast  . feeding supplement  237 mL Oral BID BM  . heparin  5,000 Units Subcutaneous Q8H  . HYDROmorphone  1 mg Intravenous Once  . LORazepam  1 mg Oral Q8H  . methadone  5 mg Oral Q8H  . pantoprazole  40 mg Oral Daily  . senna-docusate  1 tablet Oral BID  . sodium chloride      . sodium chloride       Continuous Infusions:  PRN Meds:.acetaminophen, acetaminophen, HYDROmorphone, LORazepam, ondansetron (ZOFRAN) IV, ondansetron  Assessment/Plan:  Principal Problem:  *Back pain Active Problems:  CEREBROVASCULAR DISEASE  Asbestosis  Dementia  Hyponatremia  Debility  Plan:  1. Back pain, secondary to degenerative joint disease in the lumbar spine, associated with local muscle spasm.  2. Nonphysical pain, aggravated by dementia.  3. End stage dementia.  4. Cerebrovascular disease with MRI evidence of at least 2 strokes on the background of small vessel disease.  5. History of asbestosis and obstructive sleep apnea.  6. Palliative performance score of only 30% with dependence on all activities of daily living.   Will increase frequency of dilaudid for better pain control Had discussion with daughter and wife at bedside, they are agreeable for hospice home.  Will ask hospice to see in am. Goal of therapy is patient's comfort.  I explained to them that I will discontinue medications not  directly related to his comfort and that we will not resume IV fluids for the same reason.  He will be given a diet as tolerated.  Prognosis is poor, DNR.   LOS: 2 days   Zyion Doxtater [facility] Pager: [phone] 11/23/2011, 11:39 AM

## 2011-11-24 MED ORDER — MORPHINE SULFATE (CONCENTRATE) 20 MG/ML PO SOLN: 10.0000 mg | ORAL | Status: AC | PRN

## 2011-11-24 MED ORDER — ONDANSETRON HCL 4 MG PO TABS: ORAL_TABLET | ORAL | Status: AC

## 2011-11-24 MED ORDER — SCOPOLAMINE 1 MG/3DAYS TD PT72
1.0000 | MEDICATED_PATCH | TRANSDERMAL | Status: DC
  Filled 2011-11-24: qty 1

## 2011-11-24 MED ORDER — BIOTENE DRY MOUTH MT LIQD: 15.0000 mL | Freq: Two times a day (BID) | OROMUCOSAL | Status: DC

## 2011-11-24 MED ORDER — LORAZEPAM 1 MG PO TABS: 1.0000 mg | ORAL_TABLET | Freq: Three times a day (TID) | ORAL | Status: AC | PRN

## 2011-11-24 MED ORDER — SCOPOLAMINE 1 MG/3DAYS TD PT72: 1.0000 | MEDICATED_PATCH | TRANSDERMAL | Status: AC

## 2011-11-24 NOTE — Progress Notes (Signed)
PO meds held this AM d/t patients LOC.  Patient drowsy/sleeping but aroused with movement.  Unable to communicate, just groans out.  Pain meds given as needed.  Patient is also coughing up some thin sputum and holding it in his mouth.  Bedside suction set up with yaunker d/t risk for aspiration.  Patient was attempted to be fed breakfast, but holds food in mouth and can not follow commands.

## 2011-11-24 NOTE — Progress Notes (Signed)
Patient sounds congested, performed mouth care and suctioned mouth, elevated HOB, O2 sat 93% on RA, no distress noted, resting comfortably at this time, will continue to monitor at this time

## 2011-11-24 NOTE — Discharge Summary (Signed)
Physician Discharge Summary  Patient ID: [name] MRN: [id number] DOB/AGE: 1921/11/29 78 y.o.  Admit date: 11/21/2011 Discharge date: 11/24/2011  Primary Care Physician:  Terald Sleeper, MD, MD   Discharge Diagnoses:    Principal Problem:  *Back pain Active Problems:  CEREBROVASCULAR DISEASE with history of stroke  Asbestosis  End stage Dementia  Hyponatremia  Debility Functional quadriparesis Failure to Thrive Severe protein calorie malnutrition COPD DNR, comfort measures  Medication List  As of 11/24/2011 11:37 AM   STOP taking these medications         aspirin 81 MG EC tablet      clopidogrel 75 MG tablet      donepezil 10 MG tablet      Fluticasone-Salmeterol 250-50 MCG/DOSE Aepb      HYDROcodone-acetaminophen 5-500 MG per tablet      multivitamin tablet      pantoprazole 40 MG tablet      pravastatin 40 MG tablet      SPIRIVA HANDIHALER 18 MCG inhalation capsule         TAKE these medications         acetaminophen 500 MG tablet   Commonly known as: TYLENOL   Take 1,000 mg by mouth 2 (two) times daily.      feeding supplement Liqd   Take 237 mLs by mouth 2 (two) times daily between meals.      LORazepam 1 MG tablet   Commonly known as: ATIVAN   Take 1 tablet (1 mg total) by mouth 3 (three) times daily as needed for anxiety. anxiety      morphine 20 MG/ML concentrated solution   Commonly known as: ROXANOL   Take 0.5 mLs (10 mg total) by mouth every 2 (two) hours as needed for pain.      ondansetron 4 MG tablet   Commonly known as: ZOFRAN   One every 6 hours as needed for nauseau      scopolamine 1.5 MG   Commonly known as: TRANSDERM-SCOP   Place 1 patch (1.5 mg total) onto the skin every 3 (three) days.           Discharge Exam: Patient does not respond to voice, occasionally moans in pain Blood pressure 147/31, pulse 101, temperature 101.2 F (38.4 C), temperature source Axillary, resp. rate 20, height 6' (1.829 m), weight  76.567 kg (168 lb 12.8 oz), SpO2 89.00%. NAD Rhonchi b/l S1, S2, irregular Soft, NT, BS+ No edema b/l  Disposition and Follow-up:  Patient will be discharged to a hospice home for end of life care/comfort care  Consults: none   Significant Diagnostic Studies:  Mr Brain 55 Contrast  11/21/2011  *RADIOLOGY REPORT*  Clinical Data: 78 year old male with confusion, combative.  History of colon cancer.  MRI HEAD WITHOUT CONTRAST  Technique:  Multiplanar, multiecho pulse sequences of the brain and surrounding structures were obtained according to standard protocol without intravenous contrast.  Comparison: Brain MRI 06/30/2011.  Findings: No restricted diffusion to suggest acute infarction. Interval expected evolution of right paracentral pontine infarct seen in October.  Major intracranial vascular flow voids are stable. Stable ventricle size and configuration.  No acute intracranial hemorrhage identified.  No midline shift, mass effect, or evidence of mass lesion.  Negative pituitary, cervicomedullary junction, and visualized cervical spine.  Stable to mildly increased nonspecific periventricular white matter T2 and FLAIR hyperintensity. Stable chronic lacunar infarct in the left cerebellum.  Chronic sphenoid sinus inflammatory changes. Visualized bone marrow signal is within normal limits.  Postoperative changes to the globes.  Other paranasal sinuses and mastoids are clear.  Negative scalp soft tissues.  IMPRESSION: No acute intracranial abnormality.  Interval expected evolution of brain stem infarct seen in October.  Original Report Authenticated By: Harley Hallmark, M.D.   Dg Chest Port 1 View  11/21/2011  *RADIOLOGY REPORT*  Clinical Data: Back pain  PORTABLE CHEST - 1 VIEW  Comparison: 10/03/2011  Findings: Cardiomegaly again noted.  Status post CABG again noted. Stable bilateral chronic interstitial lung disease.  Stable right lateral pleural thickening.  No definite infiltrate or superimposed  pulmonary edema.  IMPRESSION:  Status post CABG again noted.  Stable bilateral chronic interstitial lung disease.  Stable right lateral pleural thickening.  No definite infiltrate or superimposed pulmonary edema.  Original Report Authenticated By: Natasha Mead, M.D.    Brief H and P: For complete details please refer to admission H and P, but in brief This 78 year old man, who appears to have moderate to severe dementia, presents with the above symptoms. He was in the skilled nursing facility, [facility] until 3 days ago when he was discharged home for financial reasons. Since he has been at home, he has been awake at night in pain from his back. He apparently was like this in the skilled nursing facility also. He has lost 20 pounds in the last 6 months since he had his stroke in October 2012. He has a modified diet because of swallowing issues. He has become more confused over the last 6 months and now he is unable to walk, nor perform any activities of daily living such as bathing and dressing. He needs to be fed. He is incontinent of urine and feces. He does appear to recognize his family but not all the time. He has multiple medical problems outlined below.  The family are at a loss of how to manage with him at home because he is in so much pain.   Hospital Course:  This gentleman was recently in the nursing home and was discharged home a few days prior to admission.  Patient was brought in with back pain and worsening confusion/dehydration.  Patient started on scheduled analgesics as well as scheduled ativan.  Patient has been bed bound for quite some time now.  He has been declining since he suffered a stroke last year. His wife reports that he never really recovered.  This is all superimposed on his underlying dementia, which appears to be quite severe. His po intake has been very poor due to his underlying medical conditions, and he has been failing to thrive. Goals of care was discussed with his wife  and daughter who understand his poor prognosis.  They are agreeable that patient should be followed by hospice and would prefer a residential hospice facility. They have elected to pursue a comfort care approach and I agree with this.  All medications not directly related to his comfort have been discontinued. He is awaiting a hospice evaluation, and can likely be transferred to a residential hospice once a bed is available.  Time spent on Discharge:  Signed: Tanyia Grabbe [facility] Pager: [phone] 11/24/2011, 11:37 AM

## 2011-11-24 NOTE — Progress Notes (Signed)
[facility] has been contacted for referral to [facility]. Await their callback. Reece Levy, MSW, Theresia Majors [phone]

## 2011-11-24 NOTE — Plan of Care (Signed)
Problem: Phase I Progression Outcomes Goal: OOB as tolerated unless otherwise ordered Outcome: Not Progressing Patient is bed bound - performing comfort measures at this time

## 2011-11-24 NOTE — Plan of Care (Signed)
Problem: Phase III Progression Outcomes Goal: Pain controlled on oral analgesia Outcome: Not Progressing Patient unable to take PO meds at this time  - risk for aspiration and decreased LOC

## 2011-11-24 NOTE — Progress Notes (Signed)
Patient discharged to [facility].  Packet sent with patient and report called to [facility], Charity fundraiser.  IV left in place.

## 2011-11-24 NOTE — Plan of Care (Signed)
Problem: Phase III Progression Outcomes Goal: IV/normal saline lock discontinued Outcome: Not Progressing Patient discharged to hospice with IV in place

## 2011-11-24 NOTE — Progress Notes (Addendum)
Patient O2 sat 84% on RA, mildly congested sounded, placed patient on O2 at 2L, O2 sat now 94%, Patient also noted to have a 102 temp, Tylenol supp given, held patients 0600 po Methadone and Ativan d/t patients LOC and risk for aspiration, gave IV Dilaudid instead for pain, paged Dr. Orvan Falconer to made aware of desat

## 2011-11-26 NOTE — Progress Notes (Signed)
Utilization review completed.  

## 2011-11-26 NOTE — Telephone Encounter (Signed)
I spoke with Marcelino Duster at [facility], and they have contacted to family on VM but have not received a return call.  He is eligible for their services.

## 2011-12-01 DEATH — deceased

## 2013-10-08 IMAGING — CR DG CHEST 1V PORT
1 series · 1 of 1 positions shown · non-contrast
Comparison: 08/17/2011

CLINICAL DATA: Shortness of breath.

PORTABLE CHEST - 1 VIEW

[AP]
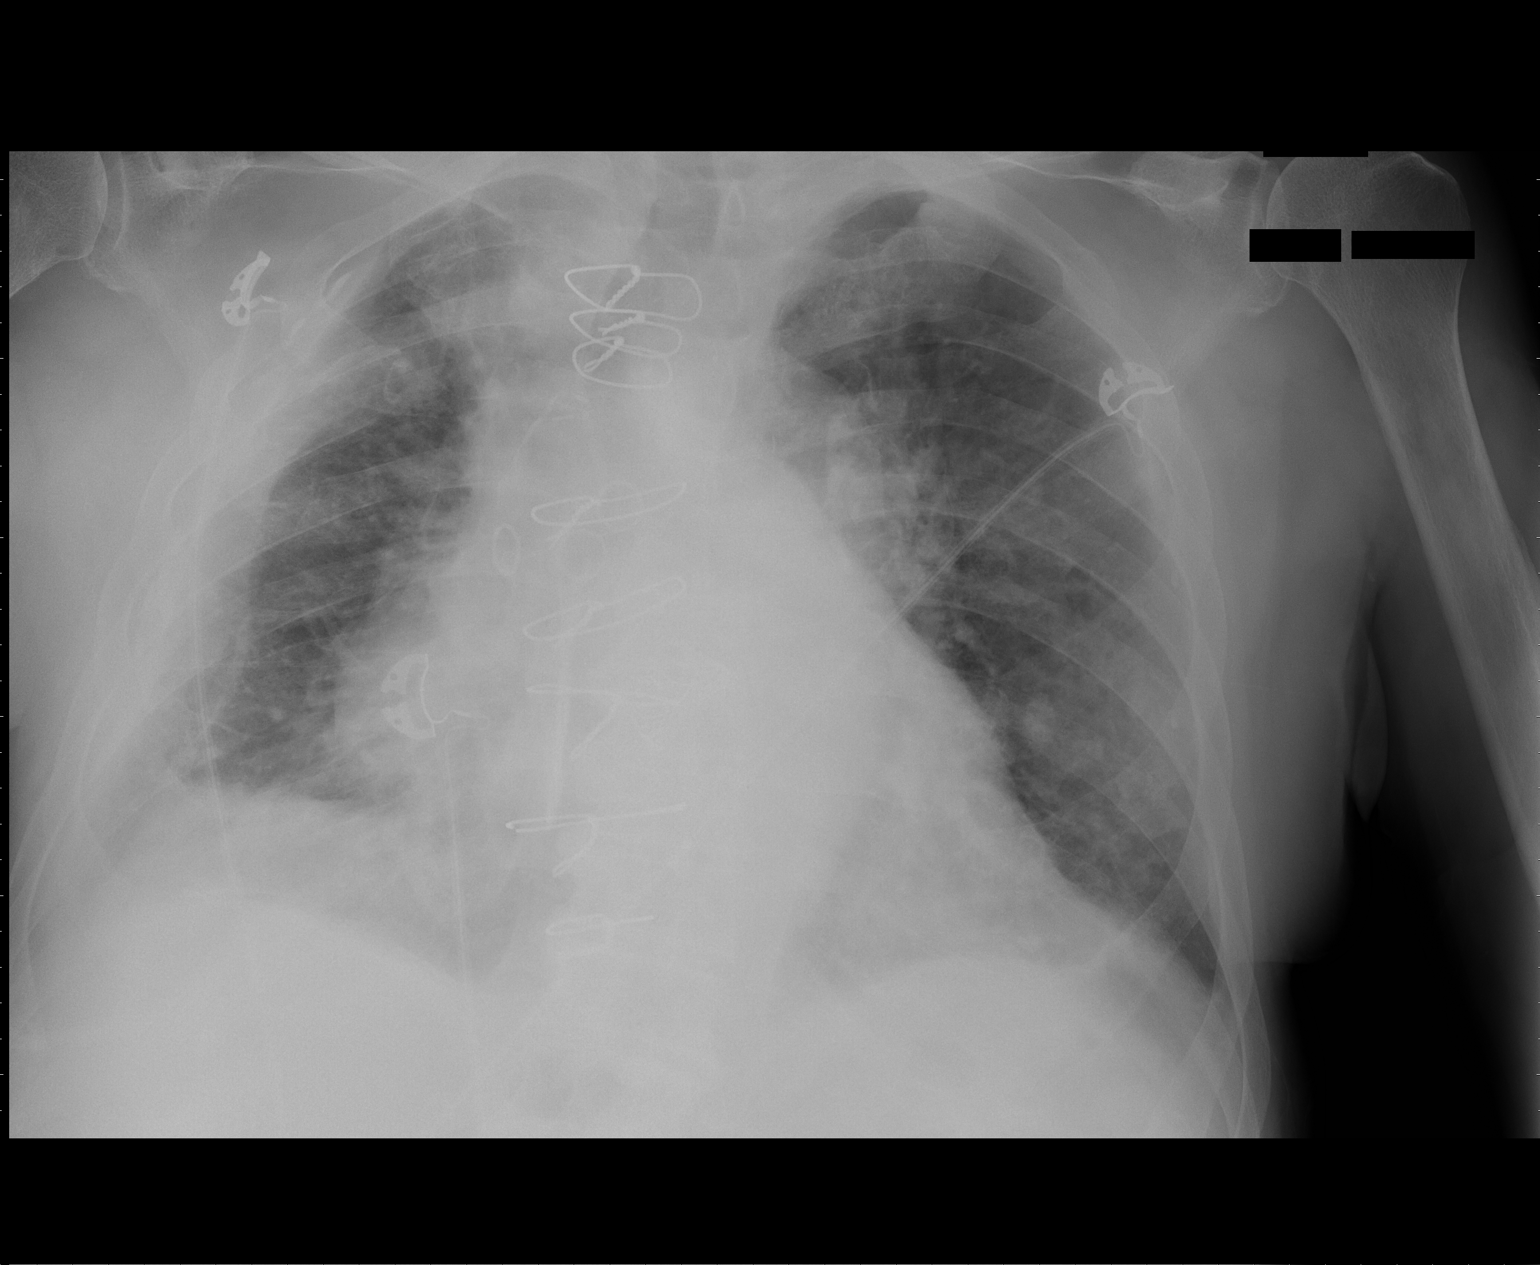

[1 of 1 positions shown; findings below may reference images not displayed]

FINDINGS: There is no visible endotracheal tube.  The patient has
calcified pleural plaques bilaterally.  Heart size and vascularity
are normal.  Tortuosity of the thoracic aorta.  No acute
infiltrates or effusions.
IMPRESSION: Chronic changes in the lungs with pleural plaques.  No acute
abnormalities.

## 2013-10-14 NOTE — Telephone Encounter (Signed)
No other info °

## 2013-10-16 IMAGING — CR DG WRIST COMPLETE 3+V*R*
4 series · 4 of 4 positions shown · non-contrast
Comparison: Right hand series from the same day.

CLINICAL DATA: 89-year-old male with pain, possible fracture.

RIGHT WRIST - COMPLETE 3+ VIEW

[view not recorded (1 of 4)]
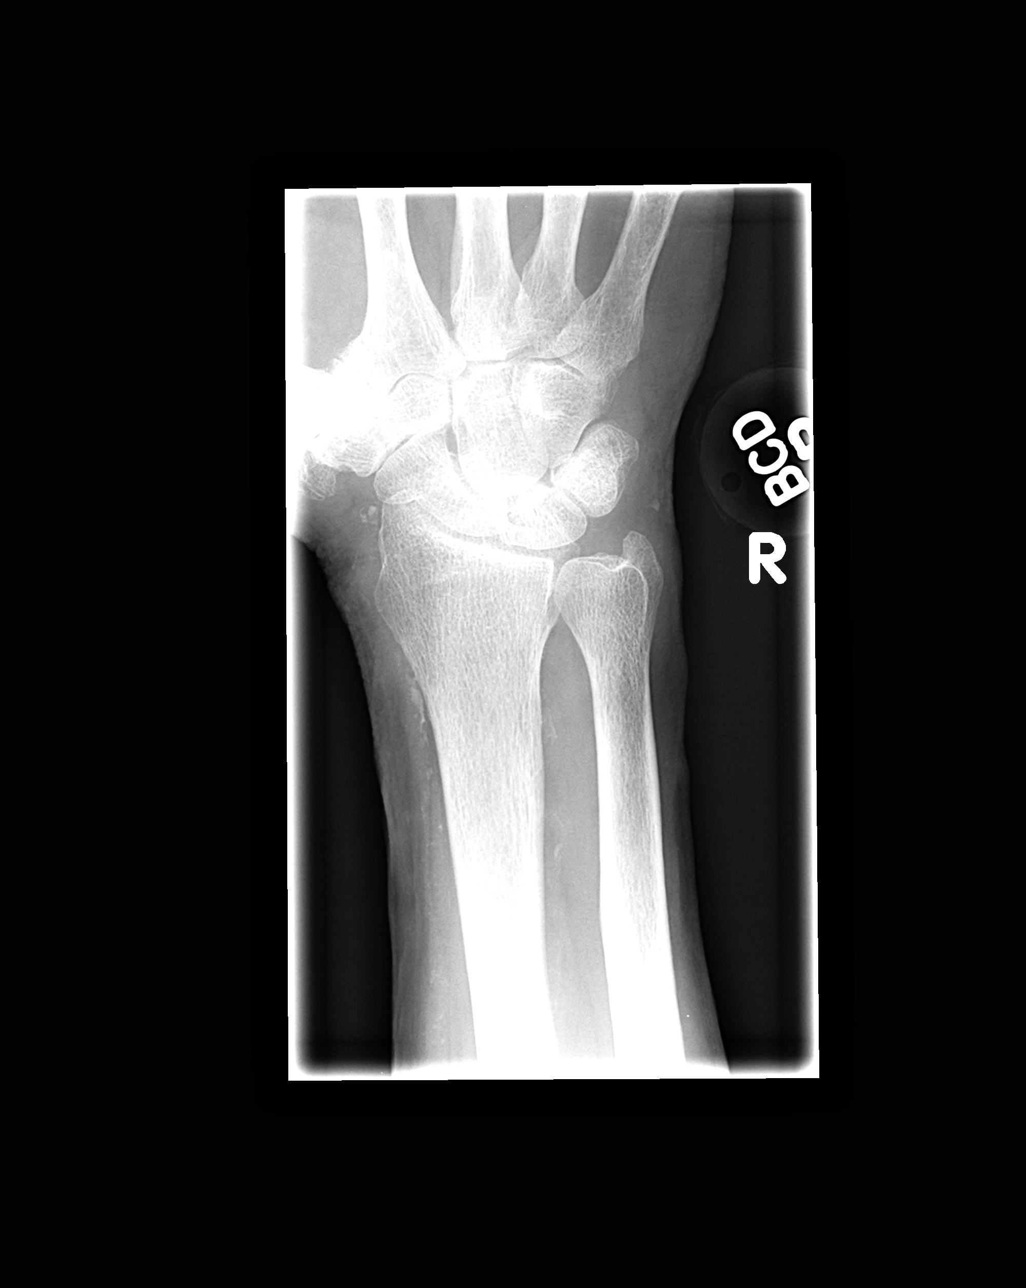

[view not recorded (2 of 4)]
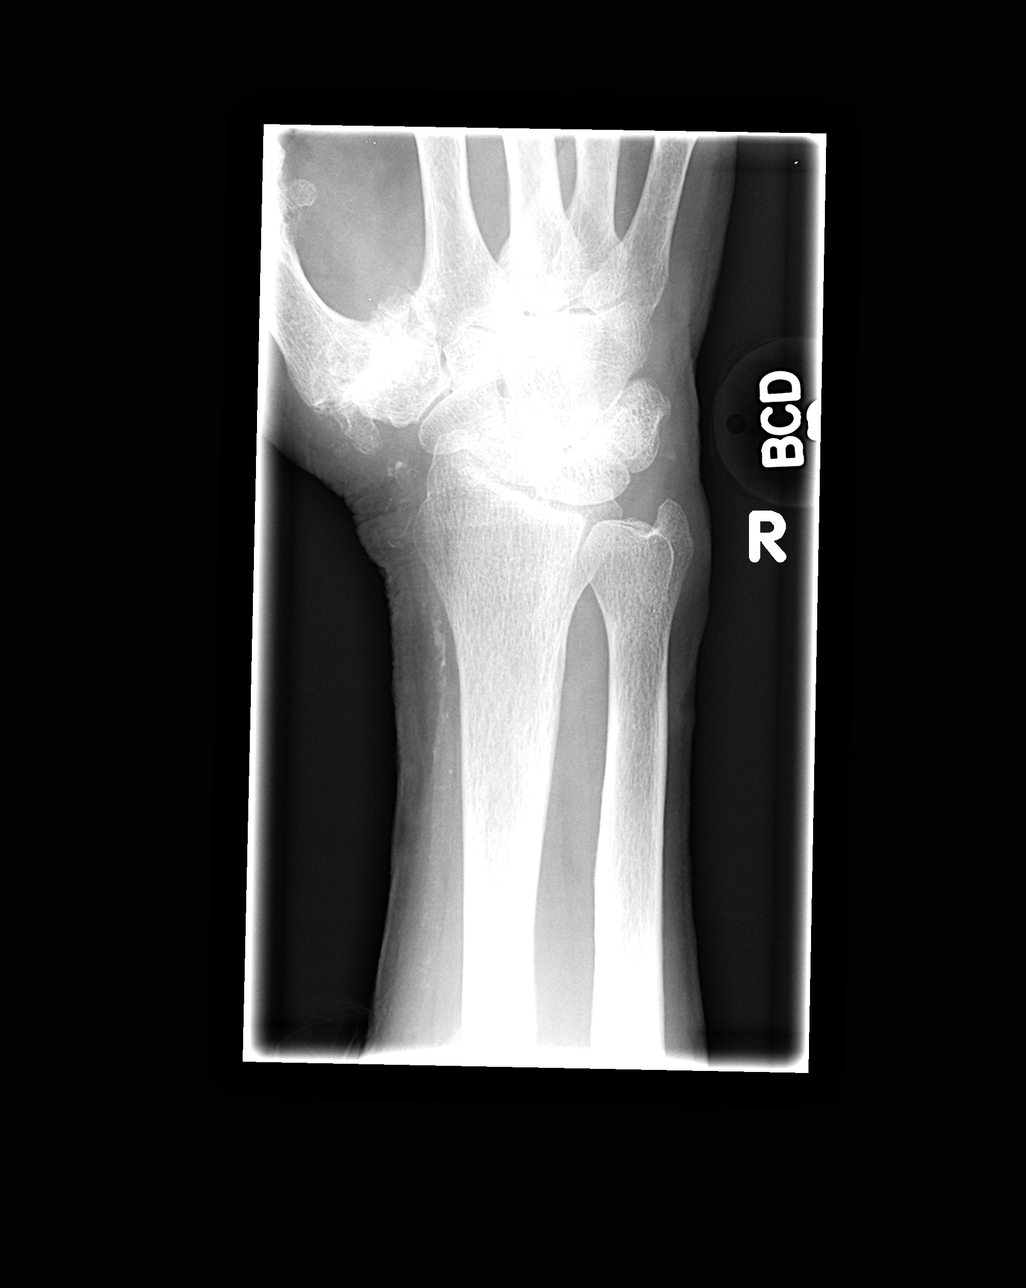

[view not recorded (3 of 4)]
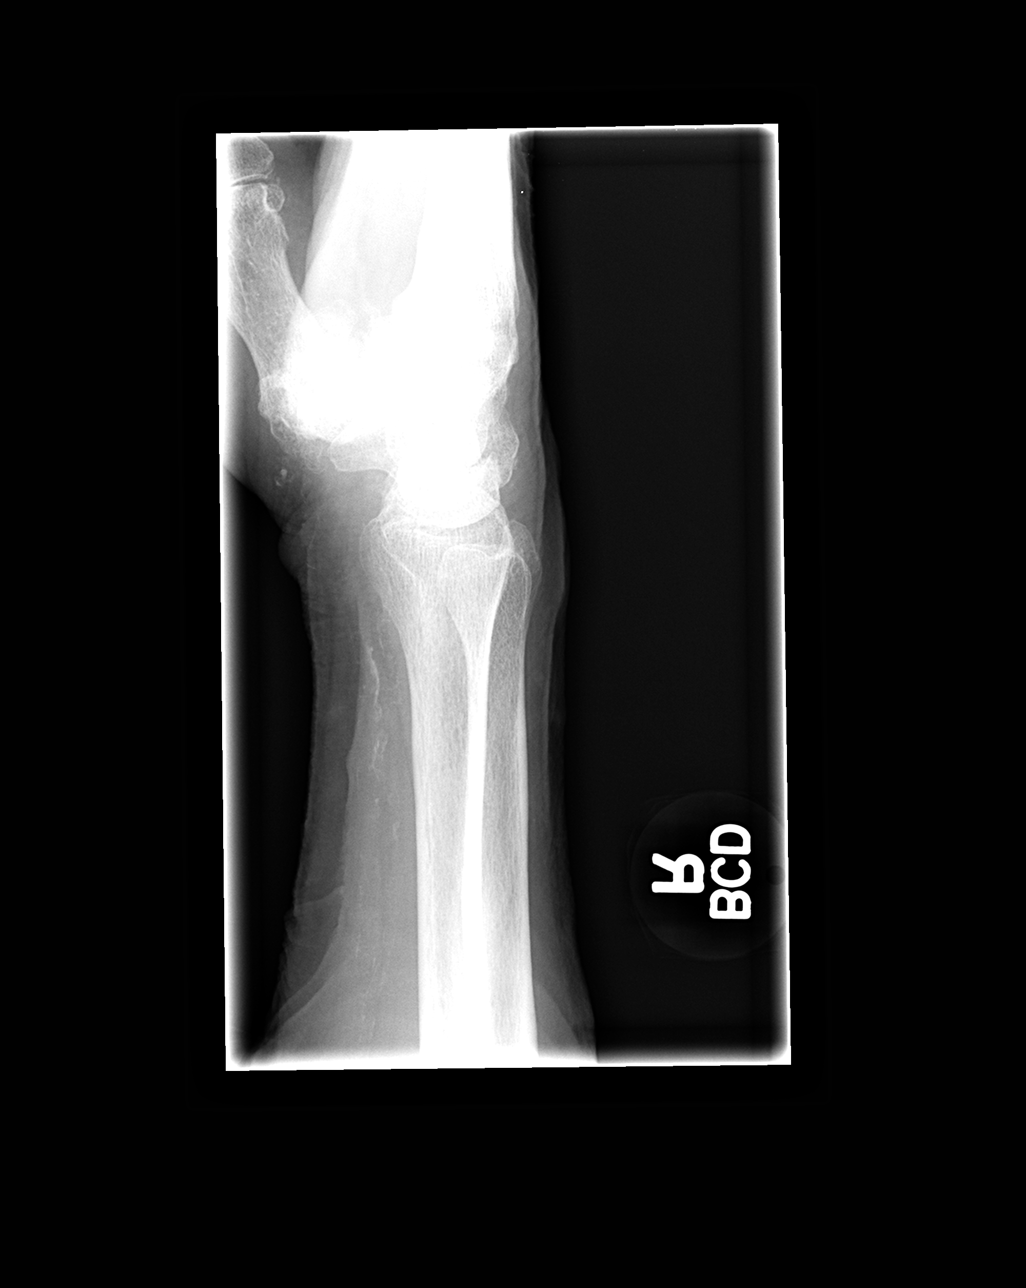

[view not recorded (4 of 4)]
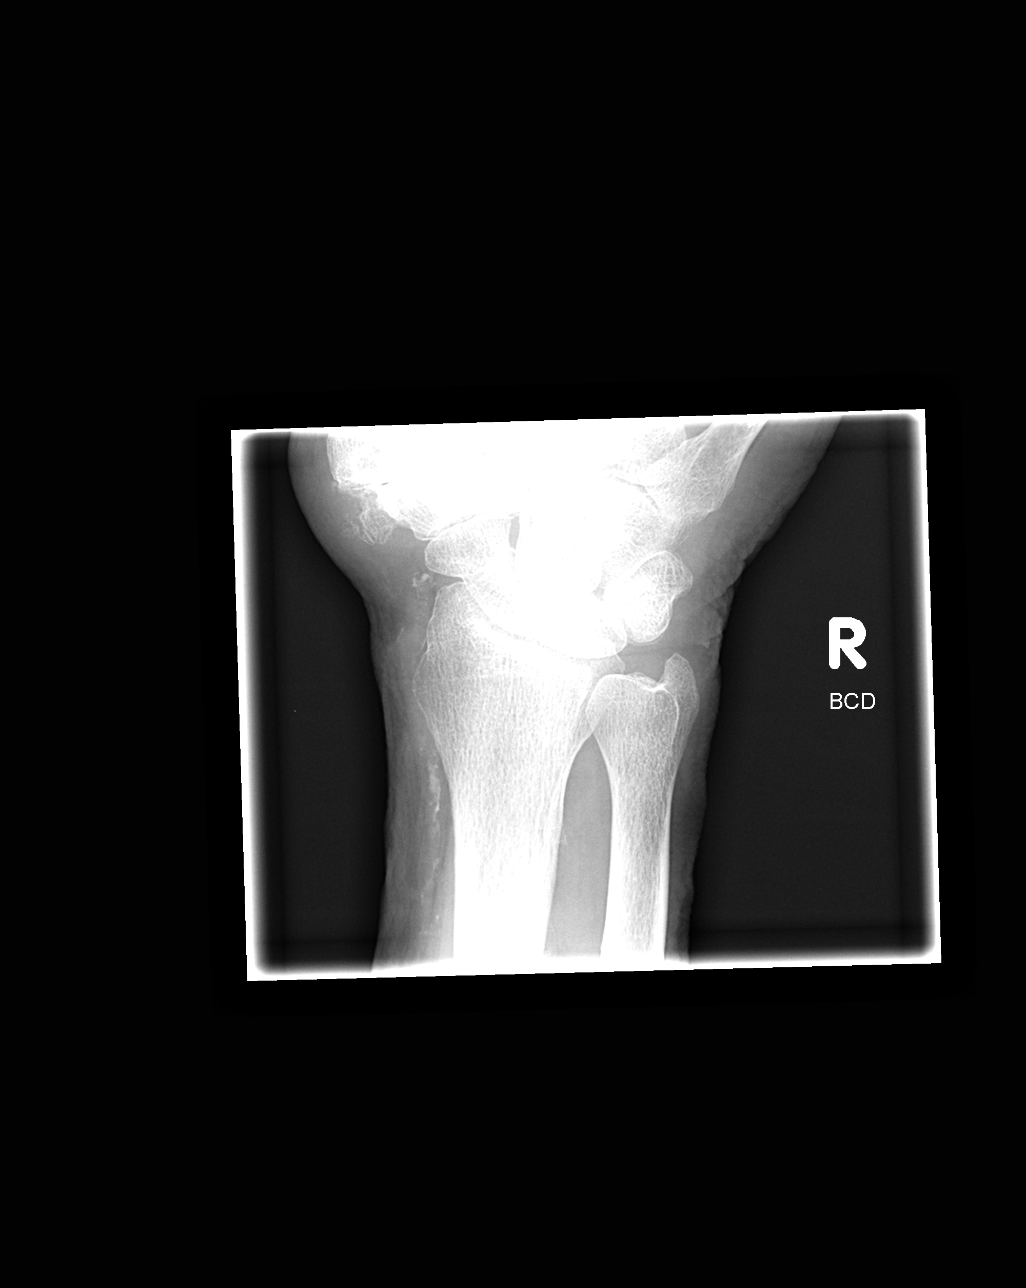

[4 of 4 positions shown; findings below may reference images not displayed]

FINDINGS: Calcified atherosclerosis in the distal forearm and
wrist.  Advanced joint space loss and sclerosis of the right thumb
metacarpal joint again noted.  Distal radius and ulna appear
intact.  Carpal bone alignment is within normal limits.  No acute
fracture or dislocation is identified.
IMPRESSION: Osteoarthritis at the right thumb metacarpal joint.  Calcified
atherosclerosis.  No acute fracture or dislocation identified in
the right wrist.

## 2013-10-16 IMAGING — CR DG HAND COMPLETE 3+V*R*
3 series · 3 of 3 positions shown · non-contrast
Comparison: None.

CLINICAL DATA: 89-year-old male with pain.  Possible acute
fracture.

RIGHT HAND - COMPLETE 3+ VIEW

[view not recorded (1 of 3)]
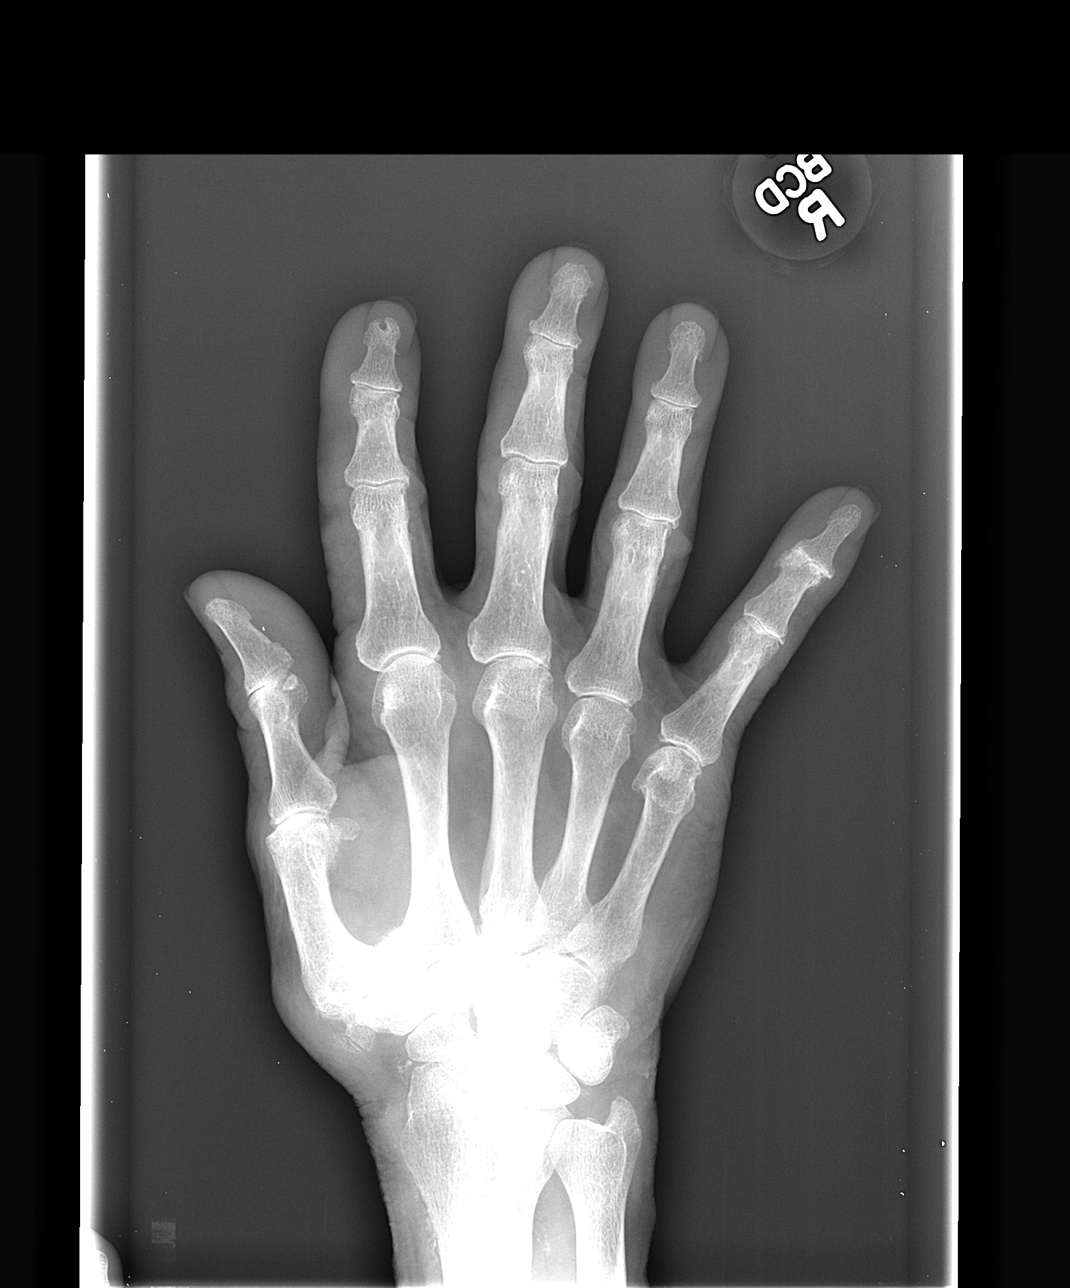

[view not recorded (2 of 3)]
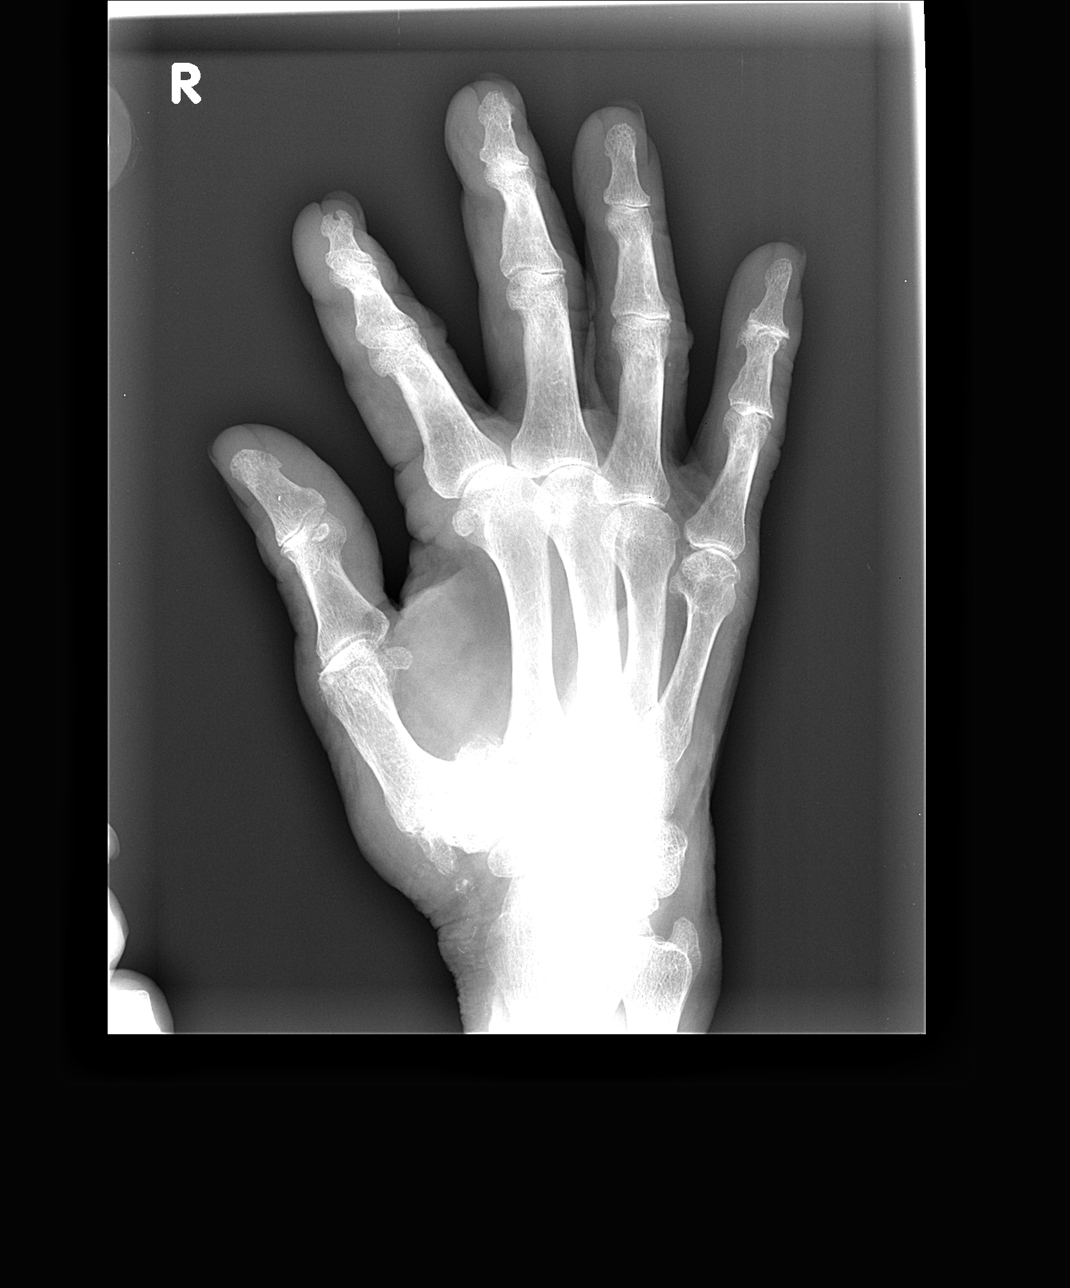

[view not recorded (3 of 3)]
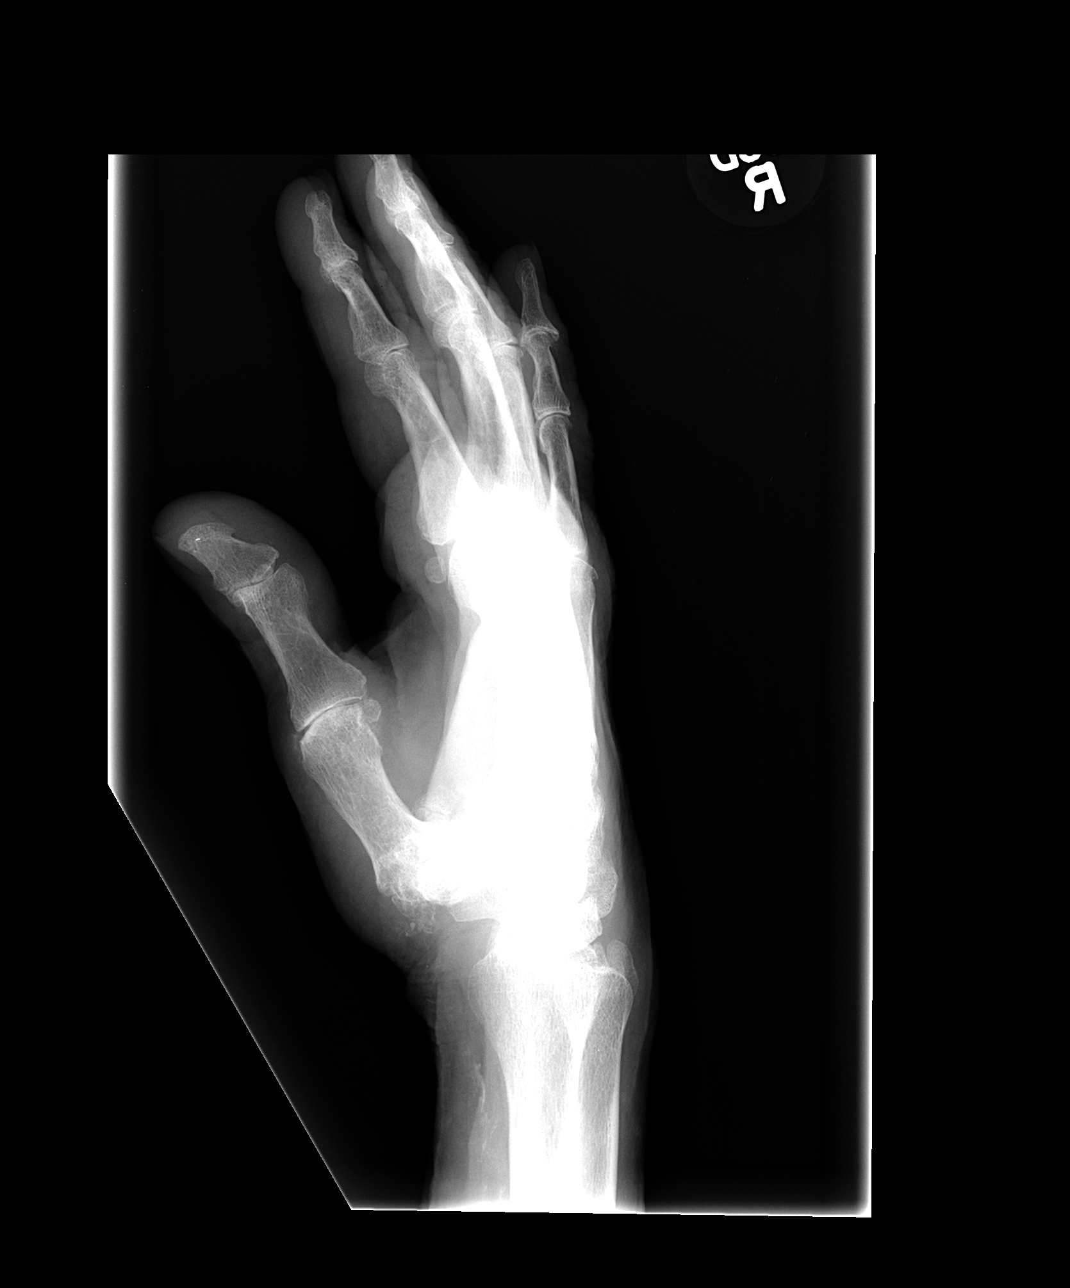

[3 of 3 positions shown; findings below may reference images not displayed]

FINDINGS: Calcified atherosclerosis about the wrist.  Advanced
joint space loss and subchondral sclerosis at the right thumb
metacarpal joint.  Interphalangeal joint space loss and spurring
also present diffusely.  Otherwise normal for age bone
mineralization.  Small benign cortical defect at the tuft of the
right second distal phalanx.  No acute fracture dislocation
identified.
IMPRESSION: Osteoarthritis.  No acute fracture or dislocation identified.
